# Patient Record
Sex: Female | Born: 1950
Health system: Southern US, Community
[De-identification: ages and names within clinical notes are randomized; demographics above are authoritative.]

## PROBLEM LIST (undated history)

## (undated) DIAGNOSIS — I10 Essential (primary) hypertension: Secondary | ICD-10-CM

## (undated) DIAGNOSIS — E785 Hyperlipidemia, unspecified: Secondary | ICD-10-CM

## (undated) DIAGNOSIS — I739 Peripheral vascular disease, unspecified: Secondary | ICD-10-CM

## (undated) DIAGNOSIS — I779 Disorder of arteries and arterioles, unspecified: Secondary | ICD-10-CM

## (undated) DIAGNOSIS — D509 Iron deficiency anemia, unspecified: Secondary | ICD-10-CM

## (undated) DIAGNOSIS — J309 Allergic rhinitis, unspecified: Secondary | ICD-10-CM

## (undated) DIAGNOSIS — H9190 Unspecified hearing loss, unspecified ear: Secondary | ICD-10-CM

## (undated) DIAGNOSIS — R0789 Other chest pain: Secondary | ICD-10-CM

## (undated) DIAGNOSIS — K635 Polyp of colon: Secondary | ICD-10-CM

## (undated) DIAGNOSIS — A523 Neurosyphilis, unspecified: Secondary | ICD-10-CM

## (undated) DIAGNOSIS — I517 Cardiomegaly: Secondary | ICD-10-CM

## (undated) DIAGNOSIS — D649 Anemia, unspecified: Secondary | ICD-10-CM

## (undated) DIAGNOSIS — I2510 Atherosclerotic heart disease of native coronary artery without angina pectoris: Secondary | ICD-10-CM

## (undated) HISTORY — DX: Atherosclerotic heart disease of native coronary artery without angina pectoris: I25.10

## (undated) HISTORY — DX: Peripheral vascular disease, unspecified: I73.9

## (undated) HISTORY — PX: UPPER GI ENDOSCOPY: SHX6162

## (undated) HISTORY — DX: Other chest pain: R07.89

## (undated) HISTORY — DX: Disorder of arteries and arterioles, unspecified: I77.9

## (undated) HISTORY — DX: Allergic rhinitis, unspecified: J30.9

## (undated) HISTORY — PX: COLONOSCOPY: SHX174

## (undated) HISTORY — DX: Cardiomegaly: I51.7

## (undated) HISTORY — DX: Unspecified hearing loss, unspecified ear: H91.90

## (undated) HISTORY — DX: Anemia, unspecified: D64.9

## (undated) HISTORY — DX: Iron deficiency anemia, unspecified: D50.9

## (undated) HISTORY — DX: Neurosyphilis, unspecified: A52.3

## (undated) HISTORY — DX: Polyp of colon: K63.5

---

## 2005-04-30 ENCOUNTER — Ambulatory Visit: Payer: Self-pay

## 2006-05-14 ENCOUNTER — Ambulatory Visit: Payer: Self-pay

## 2007-06-22 ENCOUNTER — Ambulatory Visit: Payer: Self-pay | Admitting: Internal Medicine

## 2008-06-26 ENCOUNTER — Ambulatory Visit: Payer: Self-pay | Admitting: Internal Medicine

## 2009-05-29 ENCOUNTER — Ambulatory Visit: Payer: Self-pay | Admitting: Internal Medicine

## 2009-06-27 ENCOUNTER — Ambulatory Visit: Payer: Self-pay | Admitting: Internal Medicine

## 2010-07-02 ENCOUNTER — Ambulatory Visit: Payer: Self-pay | Admitting: Internal Medicine

## 2011-08-13 ENCOUNTER — Ambulatory Visit: Payer: Self-pay

## 2012-09-02 ENCOUNTER — Ambulatory Visit: Payer: Self-pay

## 2013-11-14 ENCOUNTER — Ambulatory Visit: Payer: Self-pay

## 2014-05-15 DIAGNOSIS — R2232 Localized swelling, mass and lump, left upper limb: Secondary | ICD-10-CM | POA: Diagnosis not present

## 2014-05-15 DIAGNOSIS — J309 Allergic rhinitis, unspecified: Secondary | ICD-10-CM | POA: Diagnosis not present

## 2014-05-15 DIAGNOSIS — F17210 Nicotine dependence, cigarettes, uncomplicated: Secondary | ICD-10-CM | POA: Diagnosis not present

## 2014-05-15 DIAGNOSIS — I10 Essential (primary) hypertension: Secondary | ICD-10-CM | POA: Diagnosis not present

## 2014-10-16 DIAGNOSIS — I10 Essential (primary) hypertension: Secondary | ICD-10-CM | POA: Diagnosis not present

## 2014-10-16 DIAGNOSIS — E782 Mixed hyperlipidemia: Secondary | ICD-10-CM | POA: Diagnosis not present

## 2014-10-16 DIAGNOSIS — H9190 Unspecified hearing loss, unspecified ear: Secondary | ICD-10-CM | POA: Diagnosis not present

## 2014-10-16 DIAGNOSIS — F17210 Nicotine dependence, cigarettes, uncomplicated: Secondary | ICD-10-CM | POA: Diagnosis not present

## 2014-10-16 DIAGNOSIS — Z0001 Encounter for general adult medical examination with abnormal findings: Secondary | ICD-10-CM | POA: Diagnosis not present

## 2014-10-30 DIAGNOSIS — Z0001 Encounter for general adult medical examination with abnormal findings: Secondary | ICD-10-CM | POA: Diagnosis not present

## 2014-10-30 DIAGNOSIS — I10 Essential (primary) hypertension: Secondary | ICD-10-CM | POA: Diagnosis not present

## 2014-10-30 DIAGNOSIS — E559 Vitamin D deficiency, unspecified: Secondary | ICD-10-CM | POA: Diagnosis not present

## 2014-10-30 DIAGNOSIS — E782 Mixed hyperlipidemia: Secondary | ICD-10-CM | POA: Diagnosis not present

## 2015-01-08 DIAGNOSIS — Z13820 Encounter for screening for osteoporosis: Secondary | ICD-10-CM | POA: Diagnosis not present

## 2015-01-08 DIAGNOSIS — M85852 Other specified disorders of bone density and structure, left thigh: Secondary | ICD-10-CM | POA: Diagnosis not present

## 2015-01-08 DIAGNOSIS — M85862 Other specified disorders of bone density and structure, left lower leg: Secondary | ICD-10-CM | POA: Diagnosis not present

## 2015-01-08 DIAGNOSIS — N958 Other specified menopausal and perimenopausal disorders: Secondary | ICD-10-CM | POA: Diagnosis not present

## 2015-01-08 DIAGNOSIS — Z1231 Encounter for screening mammogram for malignant neoplasm of breast: Secondary | ICD-10-CM | POA: Diagnosis not present

## 2015-09-05 DIAGNOSIS — H40003 Preglaucoma, unspecified, bilateral: Secondary | ICD-10-CM | POA: Diagnosis not present

## 2015-09-24 DIAGNOSIS — I10 Essential (primary) hypertension: Secondary | ICD-10-CM | POA: Diagnosis not present

## 2015-09-24 DIAGNOSIS — M150 Primary generalized (osteo)arthritis: Secondary | ICD-10-CM | POA: Diagnosis not present

## 2015-09-24 DIAGNOSIS — F17210 Nicotine dependence, cigarettes, uncomplicated: Secondary | ICD-10-CM | POA: Diagnosis not present

## 2015-09-24 DIAGNOSIS — E782 Mixed hyperlipidemia: Secondary | ICD-10-CM | POA: Diagnosis not present

## 2015-11-07 DIAGNOSIS — H40003 Preglaucoma, unspecified, bilateral: Secondary | ICD-10-CM | POA: Diagnosis not present

## 2015-11-09 DIAGNOSIS — Z0001 Encounter for general adult medical examination with abnormal findings: Secondary | ICD-10-CM | POA: Diagnosis not present

## 2015-11-09 DIAGNOSIS — I10 Essential (primary) hypertension: Secondary | ICD-10-CM | POA: Diagnosis not present

## 2015-11-09 DIAGNOSIS — E559 Vitamin D deficiency, unspecified: Secondary | ICD-10-CM | POA: Diagnosis not present

## 2015-11-09 DIAGNOSIS — E782 Mixed hyperlipidemia: Secondary | ICD-10-CM | POA: Diagnosis not present

## 2015-11-14 DIAGNOSIS — H40003 Preglaucoma, unspecified, bilateral: Secondary | ICD-10-CM | POA: Diagnosis not present

## 2015-11-15 DIAGNOSIS — G5601 Carpal tunnel syndrome, right upper limb: Secondary | ICD-10-CM | POA: Diagnosis not present

## 2015-11-15 DIAGNOSIS — F17210 Nicotine dependence, cigarettes, uncomplicated: Secondary | ICD-10-CM | POA: Diagnosis not present

## 2015-11-15 DIAGNOSIS — Z0001 Encounter for general adult medical examination with abnormal findings: Secondary | ICD-10-CM | POA: Diagnosis not present

## 2015-11-15 DIAGNOSIS — I10 Essential (primary) hypertension: Secondary | ICD-10-CM | POA: Diagnosis not present

## 2015-11-15 DIAGNOSIS — E782 Mixed hyperlipidemia: Secondary | ICD-10-CM | POA: Diagnosis not present

## 2016-05-28 ENCOUNTER — Other Ambulatory Visit: Payer: Self-pay | Admitting: Nurse Practitioner

## 2016-05-28 DIAGNOSIS — E782 Mixed hyperlipidemia: Secondary | ICD-10-CM | POA: Diagnosis not present

## 2016-05-28 DIAGNOSIS — I10 Essential (primary) hypertension: Secondary | ICD-10-CM | POA: Diagnosis not present

## 2016-05-28 DIAGNOSIS — G5603 Carpal tunnel syndrome, bilateral upper limbs: Secondary | ICD-10-CM | POA: Diagnosis not present

## 2016-05-28 DIAGNOSIS — Z1231 Encounter for screening mammogram for malignant neoplasm of breast: Secondary | ICD-10-CM

## 2016-07-01 ENCOUNTER — Ambulatory Visit
Admission: RE | Admit: 2016-07-01 | Discharge: 2016-07-01 | Disposition: A | Discharge status: Home / Self Care | Payer: Commercial Managed Care - HMO | Source: Ambulatory Visit | Attending: Nurse Practitioner | Admitting: Nurse Practitioner

## 2016-07-01 DIAGNOSIS — Z1231 Encounter for screening mammogram for malignant neoplasm of breast: Secondary | ICD-10-CM | POA: Diagnosis not present

## 2016-07-10 ENCOUNTER — Inpatient Hospital Stay
Admission: RE | Admit: 2016-07-10 | Discharge: 2016-07-10 | Disposition: A | Discharge status: Home / Self Care | Payer: Self-pay | Source: Ambulatory Visit | Attending: *Deleted | Admitting: *Deleted

## 2016-07-10 ENCOUNTER — Other Ambulatory Visit: Payer: Self-pay | Admitting: *Deleted

## 2016-07-10 DIAGNOSIS — Z9289 Personal history of other medical treatment: Secondary | ICD-10-CM

## 2016-12-02 DIAGNOSIS — N390 Urinary tract infection, site not specified: Secondary | ICD-10-CM | POA: Diagnosis not present

## 2016-12-02 DIAGNOSIS — Z0001 Encounter for general adult medical examination with abnormal findings: Secondary | ICD-10-CM | POA: Diagnosis not present

## 2016-12-02 DIAGNOSIS — I951 Orthostatic hypotension: Secondary | ICD-10-CM | POA: Diagnosis not present

## 2016-12-02 DIAGNOSIS — F17210 Nicotine dependence, cigarettes, uncomplicated: Secondary | ICD-10-CM | POA: Diagnosis not present

## 2016-12-02 DIAGNOSIS — I10 Essential (primary) hypertension: Secondary | ICD-10-CM | POA: Diagnosis not present

## 2016-12-02 DIAGNOSIS — E782 Mixed hyperlipidemia: Secondary | ICD-10-CM | POA: Diagnosis not present

## 2016-12-08 DIAGNOSIS — E559 Vitamin D deficiency, unspecified: Secondary | ICD-10-CM | POA: Diagnosis not present

## 2016-12-08 DIAGNOSIS — I10 Essential (primary) hypertension: Secondary | ICD-10-CM | POA: Diagnosis not present

## 2016-12-08 DIAGNOSIS — E782 Mixed hyperlipidemia: Secondary | ICD-10-CM | POA: Diagnosis not present

## 2016-12-08 DIAGNOSIS — Z0001 Encounter for general adult medical examination with abnormal findings: Secondary | ICD-10-CM | POA: Diagnosis not present

## 2016-12-29 DIAGNOSIS — E559 Vitamin D deficiency, unspecified: Secondary | ICD-10-CM | POA: Diagnosis not present

## 2016-12-29 DIAGNOSIS — D649 Anemia, unspecified: Secondary | ICD-10-CM | POA: Diagnosis not present

## 2016-12-29 DIAGNOSIS — D509 Iron deficiency anemia, unspecified: Secondary | ICD-10-CM | POA: Diagnosis not present

## 2016-12-29 DIAGNOSIS — E782 Mixed hyperlipidemia: Secondary | ICD-10-CM | POA: Diagnosis not present

## 2016-12-29 DIAGNOSIS — D519 Vitamin B12 deficiency anemia, unspecified: Secondary | ICD-10-CM | POA: Diagnosis not present

## 2016-12-29 DIAGNOSIS — R0989 Other specified symptoms and signs involving the circulatory and respiratory systems: Secondary | ICD-10-CM | POA: Diagnosis not present

## 2016-12-29 DIAGNOSIS — I10 Essential (primary) hypertension: Secondary | ICD-10-CM | POA: Diagnosis not present

## 2016-12-29 DIAGNOSIS — D6481 Anemia due to antineoplastic chemotherapy: Secondary | ICD-10-CM | POA: Diagnosis not present

## 2017-01-12 DIAGNOSIS — R944 Abnormal results of kidney function studies: Secondary | ICD-10-CM | POA: Diagnosis not present

## 2017-01-19 DIAGNOSIS — D519 Vitamin B12 deficiency anemia, unspecified: Secondary | ICD-10-CM | POA: Diagnosis not present

## 2017-01-19 DIAGNOSIS — E782 Mixed hyperlipidemia: Secondary | ICD-10-CM | POA: Diagnosis not present

## 2017-01-19 DIAGNOSIS — N281 Cyst of kidney, acquired: Secondary | ICD-10-CM | POA: Diagnosis not present

## 2017-01-19 DIAGNOSIS — Z23 Encounter for immunization: Secondary | ICD-10-CM | POA: Diagnosis not present

## 2017-01-19 DIAGNOSIS — R944 Abnormal results of kidney function studies: Secondary | ICD-10-CM | POA: Diagnosis not present

## 2017-01-19 DIAGNOSIS — F17210 Nicotine dependence, cigarettes, uncomplicated: Secondary | ICD-10-CM | POA: Diagnosis not present

## 2017-01-19 DIAGNOSIS — I6523 Occlusion and stenosis of bilateral carotid arteries: Secondary | ICD-10-CM | POA: Diagnosis not present

## 2017-01-19 DIAGNOSIS — I10 Essential (primary) hypertension: Secondary | ICD-10-CM | POA: Diagnosis not present

## 2017-01-19 DIAGNOSIS — I951 Orthostatic hypotension: Secondary | ICD-10-CM | POA: Diagnosis not present

## 2017-04-15 DIAGNOSIS — R944 Abnormal results of kidney function studies: Secondary | ICD-10-CM | POA: Diagnosis not present

## 2017-04-15 DIAGNOSIS — D509 Iron deficiency anemia, unspecified: Secondary | ICD-10-CM | POA: Diagnosis not present

## 2017-04-17 ENCOUNTER — Ambulatory Visit: Payer: Self-pay | Admitting: Nurse Practitioner

## 2017-05-22 ENCOUNTER — Other Ambulatory Visit: Payer: Self-pay

## 2017-05-22 MED ORDER — LISINOPRIL-HYDROCHLOROTHIAZIDE 20-12.5 MG PO TABS: 1.0000 | ORAL_TABLET | Freq: Every day | ORAL | 1 refills | Status: DC

## 2017-05-28 ENCOUNTER — Observation Stay
Admission: EM | Admit: 2017-05-28 | Discharge: 2017-05-30 | Disposition: A | Discharge status: Home / Self Care | Payer: Medicare HMO | Attending: Internal Medicine | Admitting: Internal Medicine

## 2017-05-28 ENCOUNTER — Emergency Department: Payer: Medicare HMO

## 2017-05-28 ENCOUNTER — Encounter: Payer: Self-pay | Admitting: Emergency Medicine

## 2017-05-28 ENCOUNTER — Telehealth: Payer: Self-pay

## 2017-05-28 DIAGNOSIS — G9341 Metabolic encephalopathy: Secondary | ICD-10-CM | POA: Diagnosis not present

## 2017-05-28 DIAGNOSIS — I10 Essential (primary) hypertension: Secondary | ICD-10-CM | POA: Diagnosis not present

## 2017-05-28 DIAGNOSIS — N179 Acute kidney failure, unspecified: Secondary | ICD-10-CM | POA: Diagnosis not present

## 2017-05-28 DIAGNOSIS — E785 Hyperlipidemia, unspecified: Secondary | ICD-10-CM | POA: Diagnosis not present

## 2017-05-28 DIAGNOSIS — Z23 Encounter for immunization: Secondary | ICD-10-CM | POA: Insufficient documentation

## 2017-05-28 DIAGNOSIS — G9340 Encephalopathy, unspecified: Secondary | ICD-10-CM | POA: Diagnosis not present

## 2017-05-28 DIAGNOSIS — R4182 Altered mental status, unspecified: Secondary | ICD-10-CM

## 2017-05-28 DIAGNOSIS — F17210 Nicotine dependence, cigarettes, uncomplicated: Secondary | ICD-10-CM | POA: Diagnosis not present

## 2017-05-28 DIAGNOSIS — Z79899 Other long term (current) drug therapy: Secondary | ICD-10-CM | POA: Insufficient documentation

## 2017-05-28 DIAGNOSIS — F0390 Unspecified dementia without behavioral disturbance: Secondary | ICD-10-CM | POA: Diagnosis not present

## 2017-05-28 HISTORY — DX: Essential (primary) hypertension: I10

## 2017-05-28 HISTORY — DX: Hyperlipidemia, unspecified: E78.5

## 2017-05-28 LAB — URINALYSIS, COMPLETE (UACMP) WITH MICROSCOPIC
Bilirubin Urine: NEGATIVE
Glucose, UA: NEGATIVE mg/dL
Ketones, ur: NEGATIVE mg/dL
Leukocytes, UA: NEGATIVE
Nitrite: NEGATIVE
Protein, ur: NEGATIVE mg/dL
Specific Gravity, Urine: 1.024 (ref 1.005–1.030)
pH: 5 (ref 5.0–8.0)

## 2017-05-28 LAB — TROPONIN I: Troponin I: <0.03 ng/mL (ref <0.03)

## 2017-05-28 LAB — COMPREHENSIVE METABOLIC PANEL
ALK PHOS: 72 U/L (ref 38–126)
ALT: 13 U/L — ABNORMAL LOW (ref 14–54)
ANION GAP: 16 — AB (ref 5–15)
AST: 16 U/L (ref 15–41)
Albumin: 4.2 g/dL (ref 3.5–5.0)
BILIRUBIN TOTAL: 0.6 mg/dL (ref 0.3–1.2)
BUN: 17 mg/dL (ref 6–20)
CALCIUM: 9.6 mg/dL (ref 8.9–10.3)
CO2: 19 mmol/L — AB (ref 22–32)
Chloride: 107 mmol/L (ref 101–111)
Creatinine, Ser: 1.11 mg/dL — ABNORMAL HIGH (ref 0.44–1.00)
GFR calc non Af Amer: 50 mL/min — ABNORMAL LOW (ref >60)
GFR, EST AFRICAN AMERICAN: 58 mL/min — AB (ref >60)
GLUCOSE: 104 mg/dL — AB (ref 65–99)
Potassium: 3 mmol/L — ABNORMAL LOW (ref 3.5–5.1)
SODIUM: 142 mmol/L (ref 135–145)
TOTAL PROTEIN: 8.2 g/dL — AB (ref 6.5–8.1)

## 2017-05-28 LAB — URINE DRUG SCREEN, QUALITATIVE (ARMC ONLY)
AMPHETAMINES, UR SCREEN: NOT DETECTED
BENZODIAZEPINE, UR SCRN: NOT DETECTED
Barbiturates, Ur Screen: NOT DETECTED
Cannabinoid 50 Ng, Ur ~~LOC~~: NOT DETECTED
Cocaine Metabolite,Ur ~~LOC~~: NOT DETECTED
MDMA (ECSTASY) UR SCREEN: NOT DETECTED
METHADONE SCREEN, URINE: NOT DETECTED
OPIATE, UR SCREEN: NOT DETECTED
Phencyclidine (PCP) Ur S: NOT DETECTED
Tricyclic, Ur Screen: NOT DETECTED

## 2017-05-28 LAB — CBC
HCT: 37.3 % (ref 35.0–47.0)
Hemoglobin: 12.7 g/dL (ref 12.0–16.0)
MCH: 29.7 pg (ref 26.0–34.0)
MCHC: 34 g/dL (ref 32.0–36.0)
MCV: 87.5 fL (ref 80.0–100.0)
Platelets: 327 10*3/uL (ref 150–440)
RBC: 4.26 MIL/uL (ref 3.80–5.20)
RDW: 15.9 % — ABNORMAL HIGH (ref 11.5–14.5)
WBC: 6.2 10*3/uL (ref 3.6–11.0)

## 2017-05-28 LAB — ACETAMINOPHEN LEVEL: Acetaminophen (Tylenol), Serum: <10 ug/mL — ABNORMAL LOW (ref 10–30)

## 2017-05-28 LAB — ETHANOL: Alcohol, Ethyl (B): <10 mg/dL (ref <10)

## 2017-05-28 LAB — GLUCOSE, CAPILLARY: GLUCOSE-CAPILLARY: 96 mg/dL (ref 65–99)

## 2017-05-28 LAB — SALICYLATE LEVEL: Salicylate Lvl: <7 mg/dL (ref 2.8–30.0)

## 2017-05-28 LAB — AMMONIA: AMMONIA: 30 umol/L (ref 9–35)

## 2017-05-28 MED ORDER — LABETALOL HCL 5 MG/ML IV SOLN
10.0000 mg | Freq: Once | INTRAVENOUS | Status: AC
  Administered 2017-05-28: 10 mg via INTRAVENOUS
  Filled 2017-05-28: qty 4

## 2017-05-28 NOTE — ED Provider Notes (Signed)
[facility] Emergency Department Provider Note  ___________________________________________   First MD Initiated Contact with Patient 05/28/17 2041     (approximate)  I have reviewed the triage vital signs and the nursing notes.   HISTORY  Chief Complaint Leg Pain and Altered Mental Status   HPI Natalie Knox is a 67 y.o. female with a history of hyperlipidemia as well as hypertension who is presenting with 2-3 weeks of worsening mental status.  She is accompanied by her daughter who states that the patient is becoming more confused and last night awakened at 3 AM and has been awake ever since.  Daughter says that the patient then broke into tears at work today and the patient's daughter received a phone call from the patient's supervisor concerned about the patient's behavior.  Patient also complaining of pain to the anterior tibia just distal to the knee where she said she hit the leg against a drawer several days ago.  The patient has been ambulatory.  Daughter also states that the patient has been more forgetful over the past several weeks.  Patient drinks alcohol but denies doing this daily and denies increase in the amount of alcohol.  No psychiatric history.  Patient has been out of her blood pressure medications over the past week.  Daughter states that the patient becomes upset and denies being forgetful and acting strangely over the past several weeks.  When asked, the patient denies any event happening at work today.  Past Medical History:  Diagnosis Date  . Hyperlipidemia   . Hypertension     There are no active problems to display for this patient.   History reviewed. No pertinent surgical history.  Prior to Admission medications   Medication Sig Start Date End Date Taking? Authorizing Provider  lisinopril-hydrochlorothiazide (PRINZIDE,ZESTORETIC) 20-12.5 MG tablet Take 1 tablet by mouth daily. 05/22/17   Ronnell Freshwater, NP     Allergies Patient has no known allergies.  No family history on file.  Social History Social History   Tobacco Use  . Smoking status: Current Every Day Smoker    Packs/day: 0.50    Types: Cigarettes  . Smokeless tobacco: Never Used  Substance Use Topics  . Alcohol use: Yes    Frequency: Never    Comment: two days a week  . Drug use: No    Review of Systems  Constitutional: No fever/chills Eyes: No visual changes. ENT: No sore throat. Cardiovascular: Denies chest pain. Respiratory: Denies shortness of breath. Gastrointestinal: No abdominal pain.  No nausea, no vomiting.  No diarrhea.  No constipation. Genitourinary: Negative for dysuria. Musculoskeletal: Negative for back pain. Skin: Negative for rash. Neurological: Negative for headaches, focal weakness or numbness.   ____________________________________________   PHYSICAL EXAM:  VITAL SIGNS: ED Triage Vitals  Enc Vitals Group     BP 05/28/17 1936 (!) 186/63     Pulse Rate 05/28/17 1936 83     Resp 05/28/17 1936 17     Temp 05/28/17 1936 98.8 F (37.1 C)     Temp Source 05/28/17 1936 Oral     SpO2 05/28/17 1936 100 %     Weight 05/28/17 1938 198 lb (89.8 kg)     Height 05/28/17 1938 5\' 5"  (1.651 m)     Head Circumference --      Peak Flow --      Pain Score 05/28/17 1947 6     Pain Loc --      Pain Edu? --  Excl. in [place]? --     Constitutional: Alert and oriented x3. Well appearing and in no acute distress. Eyes: Conjunctivae are normal.  Head: Atraumatic. Nose: No congestion/rhinnorhea. Mouth/Throat: Mucous membranes are moist.  Neck: No stridor.   Cardiovascular: Normal rate, regular rhythm. Grossly normal heart sounds.  Respiratory: Normal respiratory effort.  No retractions. Lungs CTAB. Gastrointestinal: Soft and nontender. No distention. No CVA tenderness. Musculoskeletal: No lower extremity edema.  No tenderness over the anterior and proximal tibia.  Patient able to ambulate.  5 out of  5 strength to bilateral lower extreme knees.  Bilateral equal dorsalis pedis pulses.  No joint effusions. Neurologic:  Normal speech and language. No gross focal neurologic deficits are appreciated. Skin:  Skin is warm, dry and intact. No rash noted. Psychiatric: Mood and affect are normal. Speech and behavior are normal.  ____________________________________________   LABS (all labs ordered are listed, but only abnormal results are displayed)  Labs Reviewed  COMPREHENSIVE METABOLIC PANEL - Abnormal; Notable for the following components:      Result Value   Potassium 3.0 (*)    CO2 19 (*)    Glucose, Bld 104 (*)    Creatinine, Ser 1.11 (*)    Total Protein 8.2 (*)    ALT 13 (*)    GFR calc non Af Amer 50 (*)    GFR calc Af Amer 58 (*)    Anion gap 16 (*)    All other components within normal limits  CBC - Abnormal; Notable for the following components:   RDW 15.9 (*)    All other components within normal limits  URINALYSIS, COMPLETE (UACMP) WITH MICROSCOPIC - Abnormal; Notable for the following components:   Color, Urine YELLOW (*)    APPearance HAZY (*)    Hgb urine dipstick SMALL (*)    Bacteria, UA RARE (*)    Squamous Epithelial / LPF 0-5 (*)    All other components within normal limits  ACETAMINOPHEN LEVEL - Abnormal; Notable for the following components:   Acetaminophen (Tylenol), Serum <10 (*)    All other components within normal limits  URINE DRUG SCREEN, QUALITATIVE ([facility] ONLY)  ETHANOL  SALICYLATE LEVEL  AMMONIA  GLUCOSE, CAPILLARY  TROPONIN I  CBG MONITORING, ED   ____________________________________________  EKG  ED ECG REPORT I, Doran Stabler, the attending physician, personally viewed and interpreted this ECG.   Date: 05/28/2017  EKG Time: 2003  Rate: 78  Rhythm: normal sinus rhythm  Axis: Left  Intervals:none  ST&T Change: No ST segment elevation or depression.  T wave inversions in 1,  aVL.  ____________________________________________  RADIOLOGY  Streaky atelectasis or scarring at the left base of the chest x-ray.  No acute intracranial abnormality. ____________________________________________   PROCEDURES  Procedure(s) performed:   Procedures  Critical Care performed:   ____________________________________________   INITIAL IMPRESSION / ASSESSMENT AND PLAN / ED COURSE  Pertinent labs & imaging results that were available during my care of the patient were reviewed by me and considered in my medical decision making (see chart for details).  Differential diagnosis includes, but is not limited to, alcohol, illicit or prescription medications, or other toxic ingestion; intracranial pathology such as stroke or intracerebral hemorrhage; fever or infectious causes including sepsis; hypoxemia and/or hypercarbia; uremia; trauma; endocrine related disorders such as diabetes, hypoglycemia, and thyroid-related diseases; hypertensive encephalopathy; etc. As part of my medical decision making, I reviewed the following data within the electronic MEDICAL RECORD NUMBER Notes from prior ED visits  -----------------------------------------  11:18 PM on 05/28/2017 -----------------------------------------  Discussed the possibility of hypertensive encephalopathy with Dr. Jannifer Franklin.  He says that he would like to see the MRI first before admitting the patient.  Signed out to Dr. Beather Arbour.  Hospitalist aware.  Also discussed the case with the daughter and that the patient may need to be admitted to the hospital.  Patient without any complaints or distress at this time. ____________________________________________   FINAL CLINICAL IMPRESSION(S) / ED DIAGNOSES  Altered mental status.  Hypertension.      NEW MEDICATIONS STARTED DURING THIS VISIT:  New Prescriptions   No medications on file     Note:  This document was prepared using Dragon voice recognition software and may include  unintentional dictation errors.     Orbie Pyo, MD 05/28/17 [phone]

## 2017-05-28 NOTE — ED Notes (Addendum)
Pt daughter states she has right leg complaints and that her mental state is altered. Shes been talking about "weird" things and waking out of sleep crying. Started about 3 weeks ago. Pt right leg shakes and is weaker than normal according to daughter. Pt is alert to name

## 2017-05-28 NOTE — Telephone Encounter (Signed)
-----   Message from Lavera Guise, MD sent at 05/28/2017 12:48 PM EST ----- PLEASE SEND HER TO ED. I can see her once I am back in the office last week of March  ----- Message ----- From: Laurie Panda Sent: 05/28/2017  10:53 AM To: Lavera Guise, MD  Patient daughter called patient is having vivid dreams with waking up to thinking they are real. Behavior changing drastically and they are concerned and had to leave work today due to behavior changing. The daughter is requesting appt stat today or a call back  [phone]

## 2017-05-28 NOTE — Telephone Encounter (Signed)
Spoke with patient daughter and with patient symptoms new onset of dreams and believing are real and change in behavior that she needs to take patient to ER per Dr Humphrey Rolls and daughter did not agree and doesn't think her mom will be willing to go to the ER/ br

## 2017-05-28 NOTE — ED Triage Notes (Signed)
Pt comes into the ED via POV with daughter c/o right leg pain and altered mental status.  Patient states she has had excess shaking and pain in the right leg and her daughter states that "she has been talking out of her head".  Patient's daughter states that she has been getting up in the middle of the night multiple times and breaking down crying more often.  Patient in NAD at this time with even and unlabored respirations.  Patient has also been out of her BP medication for 1 week.

## 2017-05-29 ENCOUNTER — Other Ambulatory Visit: Payer: Self-pay

## 2017-05-29 ENCOUNTER — Encounter: Payer: Self-pay | Admitting: *Deleted

## 2017-05-29 ENCOUNTER — Observation Stay: Payer: Medicare HMO

## 2017-05-29 DIAGNOSIS — R4182 Altered mental status, unspecified: Secondary | ICD-10-CM | POA: Diagnosis not present

## 2017-05-29 DIAGNOSIS — G9341 Metabolic encephalopathy: Secondary | ICD-10-CM | POA: Diagnosis present

## 2017-05-29 DIAGNOSIS — N179 Acute kidney failure, unspecified: Secondary | ICD-10-CM | POA: Diagnosis not present

## 2017-05-29 DIAGNOSIS — I10 Essential (primary) hypertension: Secondary | ICD-10-CM | POA: Diagnosis not present

## 2017-05-29 DIAGNOSIS — E785 Hyperlipidemia, unspecified: Secondary | ICD-10-CM | POA: Diagnosis not present

## 2017-05-29 LAB — AMMONIA: Ammonia: 31 umol/L (ref 9–35)

## 2017-05-29 LAB — TSH: TSH: 1.253 u[IU]/mL (ref 0.350–4.500)

## 2017-05-29 MED ORDER — ONDANSETRON HCL 4 MG PO TABS: 4.0000 mg | ORAL_TABLET | Freq: Four times a day (QID) | ORAL | Status: DC | PRN

## 2017-05-29 MED ORDER — ACETAMINOPHEN 650 MG RE SUPP: 650.0000 mg | Freq: Four times a day (QID) | RECTAL | Status: DC | PRN

## 2017-05-29 MED ORDER — ONDANSETRON HCL 4 MG/2ML IJ SOLN: 4.0000 mg | Freq: Four times a day (QID) | INTRAMUSCULAR | Status: DC | PRN

## 2017-05-29 MED ORDER — HYDRALAZINE HCL 20 MG/ML IJ SOLN: 10.0000 mg | Freq: Four times a day (QID) | INTRAMUSCULAR | Status: DC | PRN

## 2017-05-29 MED ORDER — DOCUSATE SODIUM 100 MG PO CAPS
100.0000 mg | ORAL_CAPSULE | Freq: Two times a day (BID) | ORAL | Status: DC
  Administered 2017-05-29 – 2017-05-30 (×3): 100 mg via ORAL
  Filled 2017-05-29 (×3): qty 1

## 2017-05-29 MED ORDER — POTASSIUM CHLORIDE CRYS ER 20 MEQ PO TBCR
40.0000 meq | EXTENDED_RELEASE_TABLET | ORAL | Status: AC
Start: 2017-05-29 — End: 2017-05-29
  Administered 2017-05-29: 40 meq via ORAL
  Filled 2017-05-29: qty 2

## 2017-05-29 MED ORDER — ACETAMINOPHEN 325 MG PO TABS: 650.0000 mg | ORAL_TABLET | Freq: Four times a day (QID) | ORAL | Status: DC | PRN

## 2017-05-29 MED ORDER — PRAVASTATIN SODIUM 20 MG PO TABS
40.0000 mg | ORAL_TABLET | Freq: Every day | ORAL | Status: DC
  Administered 2017-05-29: 40 mg via ORAL
  Filled 2017-05-29: qty 2

## 2017-05-29 MED ORDER — SODIUM CHLORIDE 0.9 % IV SOLN
INTRAVENOUS | Status: DC
  Administered 2017-05-29 – 2017-05-30 (×4): via INTRAVENOUS

## 2017-05-29 MED ORDER — HEPARIN SODIUM (PORCINE) 5000 UNIT/ML IJ SOLN
5000.0000 [IU] | Freq: Three times a day (TID) | INTRAMUSCULAR | Status: DC
  Administered 2017-05-29 – 2017-05-30 (×4): 5000 [IU] via SUBCUTANEOUS
  Filled 2017-05-29 (×4): qty 1

## 2017-05-29 MED ORDER — AMLODIPINE BESYLATE 10 MG PO TABS
10.0000 mg | ORAL_TABLET | Freq: Every day | ORAL | Status: DC
  Administered 2017-05-29 – 2017-05-30 (×2): 10 mg via ORAL
  Filled 2017-05-29 (×2): qty 1

## 2017-05-29 MED ORDER — PNEUMOCOCCAL VAC POLYVALENT 25 MCG/0.5ML IJ INJ
0.5000 mL | INJECTION | INTRAMUSCULAR | Status: AC
  Administered 2017-05-30: 10:00:00 0.5 mL via INTRAMUSCULAR
  Filled 2017-05-29: qty 0.5

## 2017-05-29 NOTE — Plan of Care (Signed)
  Progressing Education: Knowledge of General Education information will improve 05/29/2017 1800 - Progressing by Daylene Posey, RN Health Behavior/Discharge Planning: Ability to manage health-related needs will improve 05/29/2017 1800 - Progressing by Daylene Posey, RN Activity: Risk for activity intolerance will decrease 05/29/2017 1800 - Progressing by Daylene Posey, RN Safety: Ability to remain free from injury will improve 05/29/2017 1800 - Progressing by Daylene Posey, RN Skin Integrity: Risk for impaired skin integrity will decrease 05/29/2017 1800 - Progressing by Daylene Posey, RN

## 2017-05-29 NOTE — H&P (Signed)
Natalie Knox is an 67 y.o. female.   Chief Complaint: Altered mental status HPI: The patient with past medical history of hypertension and hyperlipidemia presents to the emergency department due to confusion.  [name] daughter states that her mom has become more confused over the last week.  She was in relatively good health until this time.  In the emergency department laboratory evaluation showed metabolic derangements.  The patient displayed no focal neurological deficits but her blood pressure was significantly elevated.  She underwent CT and MRI of her head which did not reveal any acute intracranial abnormality's.  Her blood pressure improved but her mental status did not, thus the emergency department staff requested the hospitalist service to evaluate to further  Past Medical History:  Diagnosis Date  . Hyperlipidemia   . Hypertension     History reviewed. No pertinent surgical history.  The patient can can contribute to her surgical history  No family history on file.  The patient cannot contribute to her own history due to altered mental status Social History:  reports that she has been smoking cigarettes.  She has been smoking about 0.50 packs per day. she has never used smokeless tobacco. She reports that she drinks alcohol. She reports that she does not use drugs.  Allergies: No Known Allergies  Prior to Admission medications   Medication Sig Start Date End Date Taking? Authorizing Provider  amLODipine (NORVASC) 5 MG tablet Take 5 mg by mouth daily.   Yes [provider]  lisinopril-hydrochlorothiazide (PRINZIDE,ZESTORETIC) 20-12.5 MG tablet Take 1 tablet by mouth daily. 05/22/17  Yes Boscia, Greer Ee, NP  pravastatin (PRAVACHOL) 40 MG tablet Take 40 mg by mouth at bedtime.  05/22/17  Yes [provider]     Results for orders placed or performed during the hospital encounter of 05/28/17 (from the past 48 hour(s))  Comprehensive metabolic panel      Status: Abnormal   Collection Time: 05/28/17  7:51 PM  Result Value Ref Range   Sodium 142 135 - 145 mmol/L   Potassium 3.0 (L) 3.5 - 5.1 mmol/L   Chloride 107 101 - 111 mmol/L   CO2 19 (L) 22 - 32 mmol/L   Glucose, Bld 104 (H) 65 - 99 mg/dL   BUN 17 6 - 20 mg/dL   Creatinine, Ser 1.11 (H) 0.44 - 1.00 mg/dL   Calcium 9.6 8.9 - 10.3 mg/dL   Total Protein 8.2 (H) 6.5 - 8.1 g/dL   Albumin 4.2 3.5 - 5.0 g/dL   AST 16 15 - 41 U/L   ALT 13 (L) 14 - 54 U/L   Alkaline Phosphatase 72 38 - 126 U/L   Total Bilirubin 0.6 0.3 - 1.2 mg/dL   GFR calc non Af Amer 50 (L) >60 mL/min   GFR calc Af Amer 58 (L) >60 mL/min    Comment: (NOTE) The eGFR has been calculated using the CKD EPI equation. This calculation has not been validated in all clinical situations. eGFR's persistently <60 mL/min signify possible Chronic Kidney Disease.    Anion gap 16 (H) 5 - 15    Comment: Performed at [facility], [address] [place], [place] [address]  CBC     Status: Abnormal   Collection Time: 05/28/17  7:51 PM  Result Value Ref Range   WBC 6.2 3.6 - 11.0 K/uL   RBC 4.26 3.80 - 5.20 MIL/uL   Hemoglobin 12.7 12.0 - 16.0 g/dL   HCT 37.3 35.0 - 47.0 %  MCV 87.5 80.0 - 100.0 fL   MCH 29.7 26.0 - 34.0 pg   MCHC 34.0 32.0 - 36.0 g/dL   RDW 15.9 (H) 11.5 - 14.5 %   Platelets 327 150 - 440 K/uL    Comment: Performed at [facility], [address] [place], [place] [address]  Urine Drug Screen, Qualitative ([facility] only)     Status: None   Collection Time: 05/28/17  7:51 PM  Result Value Ref Range   Tricyclic, Ur Screen NONE DETECTED NONE DETECTED   Amphetamines, Ur Screen NONE DETECTED NONE DETECTED   MDMA (Ecstasy)Ur Screen NONE DETECTED NONE DETECTED   Cocaine Metabolite,Ur [place] NONE DETECTED NONE DETECTED   Opiate, Ur Screen NONE DETECTED NONE DETECTED   Phencyclidine (PCP) Ur S NONE DETECTED NONE DETECTED   Cannabinoid 50 Ng, Ur [place] NONE DETECTED NONE DETECTED   Barbiturates, Ur  Screen NONE DETECTED NONE DETECTED   Benzodiazepine, Ur Scrn NONE DETECTED NONE DETECTED   Methadone Scn, Ur NONE DETECTED NONE DETECTED    Comment: (NOTE) Tricyclics + metabolites, urine    Cutoff 1000 ng/mL Amphetamines + metabolites, urine  Cutoff 1000 ng/mL MDMA (Ecstasy), urine              Cutoff 500 ng/mL Cocaine Metabolite, urine          Cutoff 300 ng/mL Opiate + metabolites, urine        Cutoff 300 ng/mL Phencyclidine (PCP), urine         Cutoff 25 ng/mL Cannabinoid, urine                 Cutoff 50 ng/mL Barbiturates + metabolites, urine  Cutoff 200 ng/mL Benzodiazepine, urine              Cutoff 200 ng/mL Methadone, urine                   Cutoff 300 ng/mL The urine drug screen provides only a preliminary, unconfirmed analytical test result and should not be used for non-medical purposes. Clinical consideration and professional judgment should be applied to any positive drug screen result due to possible interfering substances. A more specific alternate chemical method must be used in order to obtain a confirmed analytical result. Gas chromatography / mass spectrometry (GC/MS) is the preferred confirmat ory method. Performed at [facility], [address] [place], [place] [address]   Troponin I     Status: None   Collection Time: 05/28/17  7:51 PM  Result Value Ref Range   Troponin I <0.03 <0.03 ng/mL    Comment: Performed at [facility], [address] [place], [place] [address]  Urinalysis, Complete w Microscopic     Status: Abnormal   Collection Time: 05/28/17  9:11 PM  Result Value Ref Range   Color, Urine YELLOW (A) YELLOW   APPearance HAZY (A) CLEAR   Specific Gravity, Urine 1.024 1.005 - 1.030   pH 5.0 5.0 - 8.0   Glucose, UA NEGATIVE NEGATIVE mg/dL   Hgb urine dipstick SMALL (A) NEGATIVE   Bilirubin Urine NEGATIVE NEGATIVE   Ketones, ur NEGATIVE NEGATIVE mg/dL   Protein, ur NEGATIVE NEGATIVE mg/dL   Nitrite NEGATIVE NEGATIVE    Leukocytes, UA NEGATIVE NEGATIVE   RBC / HPF 0-5 0 - 5 RBC/hpf   WBC, UA 0-5 0 - 5 WBC/hpf   Bacteria, UA RARE (A) NONE SEEN   Squamous Epithelial / LPF 0-5 (A) NONE SEEN   Mucus PRESENT  Hyaline Casts, UA PRESENT     Comment: Performed at [facility], [address] [place], [place] [address]  Glucose, capillary     Status: None   Collection Time: 05/28/17  9:42 PM  Result Value Ref Range   Glucose-Capillary 96 65 - 99 mg/dL  Ethanol     Status: None   Collection Time: 05/28/17  9:48 PM  Result Value Ref Range   Alcohol, Ethyl (B) <10 <10 mg/dL    Comment:        LOWEST DETECTABLE LIMIT FOR SERUM ALCOHOL IS 10 mg/dL FOR MEDICAL PURPOSES ONLY Performed at [facility], [address] [place], [place] [address]   Acetaminophen level     Status: Abnormal   Collection Time: 05/28/17  9:48 PM  Result Value Ref Range   Acetaminophen (Tylenol), Serum <10 (L) 10 - 30 ug/mL    Comment:        THERAPEUTIC CONCENTRATIONS VARY SIGNIFICANTLY. A RANGE OF 10-30 ug/mL MAY BE AN EFFECTIVE CONCENTRATION FOR MANY PATIENTS. HOWEVER, SOME ARE BEST TREATED AT CONCENTRATIONS OUTSIDE THIS RANGE. ACETAMINOPHEN CONCENTRATIONS >150 ug/mL AT 4 HOURS AFTER INGESTION AND >50 ug/mL AT 12 HOURS AFTER INGESTION ARE OFTEN ASSOCIATED WITH TOXIC REACTIONS. Performed at [facility], [address] [place], [place] [address]   Salicylate level     Status: None   Collection Time: 05/28/17  9:48 PM  Result Value Ref Range   Salicylate Lvl <6.2 2.8 - 30.0 mg/dL    Comment: Performed at [facility], [address] [place], [place] [address]  Ammonia     Status: None   Collection Time: 05/28/17  9:48 PM  Result Value Ref Range   Ammonia 30 9 - 35 umol/L    Comment: Performed at [facility], [address] [place], [place] [address]   Dg Chest 2 View  Result Date: 05/28/2017 CLINICAL DATA:  Altered mental status EXAM: CHEST - 2 VIEW COMPARISON:   Report 04/19/2003 FINDINGS: Streaky atelectasis or scarring at the left base. No focal consolidation or pleural effusion. Mild cardiomegaly with aortic atherosclerosis. No pneumothorax. IMPRESSION: 1. Streaky atelectasis or scarring at the left base 2. Mild cardiomegaly Electronically Signed   By: Donavan Foil M.D.   On: 05/28/2017 21:46   Ct Head Wo Contrast  Result Date: 05/28/2017 CLINICAL DATA:  Altered mental status and right leg complaints starting 3 weeks ago. EXAM: CT HEAD WITHOUT CONTRAST TECHNIQUE: Contiguous axial images were obtained from the base of the skull through the vertex without intravenous contrast. COMPARISON:  None. FINDINGS: Brain: Minimal chronic appearing small vessel ischemic disease of periventricular white matter. Mild age related involutional changes of the brain without acute intracranial hemorrhage, mass, midline shift or edema. No extra-axial fluid collections. Vascular: No hyperdense vessel sign. Moderate atherosclerosis of the carotid siphons bilaterally. Skull: Negative for fracture or focal lesions. Sinuses/Orbits: No acute finding. Other: None IMPRESSION: Mild age related involutional changes of the brain with chronic appearing small vessel ischemic disease. No acute intracranial abnormality. Electronically Signed   By: Ashley Royalty M.D.   On: 05/28/2017 21:45   Mr Brain Wo Contrast  Result Date: 05/28/2017 CLINICAL DATA:  Initial evaluation for acute altered mental status. EXAM: MRI HEAD WITHOUT CONTRAST TECHNIQUE: Multiplanar, multiecho pulse sequences of the brain and surrounding structures were obtained without intravenous contrast. COMPARISON:  Prior CT from earlier the same day. FINDINGS: Brain: Generalized age-related cerebral atrophy. Few scattered subcentimeter T2/FLAIR hyperintense foci noted within the periventricular  and deep white matter both cerebral hemispheres, nonspecific, but felt to be within normal limits for age. No abnormal foci of restricted  diffusion to suggest acute or subacute ischemia. No encephalomalacia to suggest chronic infarction. No evidence for acute or chronic intracranial hemorrhage. No mass lesion, midline shift or mass effect. No hydrocephalus. No extra-axial fluid collection. Major dural sinuses are grossly patent. Pituitary gland suprasellar region normal. Midline structures intact and normal. Vascular: Major intracranial vascular flow voids are maintained. Skull and upper cervical spine: Craniocervical junction normal. Upper cervical spine within normal limits. Bone marrow signal intensity normal. No scalp soft tissue abnormality. Sinuses/Orbits: Globes orbital soft tissues within normal limits. Paranasal sinuses are clear. No mastoid effusion. Inner ear structures grossly normal. Other: None. IMPRESSION: Normal brain MRI for age.  No acute intracranial process identified. Electronically Signed   By: Jeannine Boga M.D.   On: 05/28/2017 23:53    Review of Systems  Unable to perform ROS: Mental status change    Blood pressure (!) 140/59, pulse 64, temperature 98.8 F (37.1 C), temperature source Oral, resp. rate 17, height _0  (1.651 m), weight 89.8 kg (198 lb), SpO2 95 %. Physical Exam  Vitals reviewed. Constitutional: She appears well-developed and well-nourished. No distress.  HENT:  Head: Normocephalic and atraumatic.  Mouth/Throat: Oropharynx is clear and moist.  Eyes: Conjunctivae and EOM are normal. Pupils are equal, round, and reactive to light.  Neck: Normal range of motion. Neck supple. No JVD present. No tracheal deviation present. No thyromegaly present.  Cardiovascular: Normal rate, regular rhythm and normal heart sounds. Exam reveals no gallop and no friction rub.  No murmur heard. Respiratory: Effort normal and breath sounds normal.  GI: Soft. Bowel sounds are normal. She exhibits no distension. There is no tenderness.  Genitourinary:  Genitourinary Comments: Deferred  Musculoskeletal:  Normal range of motion. She exhibits no edema.  Lymphadenopathy:    She has no cervical adenopathy.  Neurological: She is alert. No cranial nerve deficit. She exhibits normal muscle tone.  Skin: Skin is warm and dry. No rash noted. No erythema.  Psychiatric: She has a normal mood and affect.  Difficult to assess thought and judgment as the patient has a receptive aphasia.  She is confused.     Assessment/Plan This is a 67 year old female admitted for metabolic encephalopathy. 1.  Confusion: Altered mental status appears to be due to metabolic encephalopathy.  Differential diagnosis includes beginning of dementia.  Improve metabolic derangements and consult neurology for further guidance.  The patient may need inpatient psych evaluation as well 2.  Hypertension: Improving; I have held the patient's lisinopril as this may be contributing to her worsened kidney function.  I also increased her dose of amlodipine and ordered hydralazine as needed.  Continue to monitor. 3.  Acute kidney injury: Hydrate with intravenous fluid.  Avoid nephrotoxic agents. 4.  Hyper lipidemia: Continue statin therapy 5.  DVT prophylaxis: Heparin 6.  GI prophylaxis: None The patient is a full code.  Time spent on admission orders and patient care approximately 45 minutes  Harrie Foreman, MD 05/29/2017, 2:22 AM

## 2017-05-29 NOTE — Consult Note (Signed)
Reason for Consult:AMS Referring Physician: Salary  CC: AMS  HPI: Natalie Knox is an 67 y.o. female brought to the ED for confusion by her daughter.  Daughter not available at this time therefore all history obtained from patient and chart.  Patient has had worsening confusion for the past 2-3 weeks.  Has been more forgetful.  Awakened at [facility] on the night prior to presentation and did not go back to sleep prior to presenting the day after.  She broke into tears at work yesterday and the patient's daughter received a phone call from the patient's supervisor concerned about the patient's behavior.  Patient appears to have very little insight into how she has been feeling but does recall crying a lot recently but unclear why.  She reports she has been shaking a lot as well.   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  Reports she has been driving.  Has been out of medications recently.  Lives with daughters.  Past Medical History:  Diagnosis Date  . Hyperlipidemia   . Hypertension     History reviewed. No pertinent surgical history.  Family history: Patient unable to provide  Social History:  reports that she has been smoking cigarettes.  She has been smoking about 0.50 packs per day. she has never used smokeless tobacco. She reports that she drinks alcohol. She reports that she does not use drugs.  No Known Allergies  Medications:  I have reviewed the patient's current medications. Prior to Admission:  Medications Prior to Admission  Medication Sig Dispense Refill Last Dose  . amLODipine (NORVASC) 5 MG tablet Take 5 mg by mouth daily.   05/28/2017 at Unknown time  . lisinopril-hydrochlorothiazide (PRINZIDE,ZESTORETIC) 20-12.5 MG tablet Take 1 tablet by mouth daily. 90 tablet 1 05/28/2017 at Unknown time  . pravastatin (PRAVACHOL) 40 MG tablet Take 40 mg by mouth at bedtime.    05/27/2017 at Unknown time   Scheduled: . amLODipine  10 mg Oral Daily  . docusate sodium  100 mg Oral BID  .  heparin  5,000 Units Subcutaneous Q8H  . [START ON 05/30/2017] pneumococcal 23 valent vaccine  0.5 mL Intramuscular Tomorrow-1000  . pravastatin  40 mg Oral QHS    ROS: History obtained from the patient  General ROS: negative for - chills, fatigue, fever, night sweats, weight gain or weight loss Psychological ROS: as noted in HPI Ophthalmic ROS: negative for - blurry vision, double vision, eye pain or loss of vision ENT ROS: negative for - epistaxis, nasal discharge, oral lesions, sore throat, tinnitus or vertigo Allergy and Immunology ROS: negative for - hives or itchy/watery eyes Hematological and Lymphatic ROS: negative for - bleeding problems, bruising or swollen lymph nodes Endocrine ROS: negative for - galactorrhea, hair pattern changes, polydipsia/polyuria or temperature intolerance Respiratory ROS: negative for - cough, hemoptysis, shortness of breath or wheezing Cardiovascular ROS: negative for - chest pain, dyspnea on exertion, edema or irregular heartbeat Gastrointestinal ROS: negative for - abdominal pain, diarrhea, hematemesis, nausea/vomiting or stool incontinence Genito-Urinary ROS: negative for - dysuria, hematuria, incontinence or urinary frequency/urgency Musculoskeletal ROS: right leg pain secondary to injury Neurological ROS: as noted in HPI Dermatological ROS: negative for rash and skin lesion changes  Physical Examination: Blood pressure (!) 173/64, pulse 68, temperature 98.8 F (37.1 C), temperature source Oral, resp. rate 15, height 5' 5"  (1.651 m), weight 77.8 kg (171 lb 9.6 oz), SpO2 100 %.  HEENT-  Normocephalic, no lesions, without obvious abnormality.  Normal external eye  and conjunctiva.  Normal TM's bilaterally.  Normal auditory canals and external ears. Normal external nose, mucus membranes and septum.  Normal pharynx. Cardiovascular- S1, S2 normal, pulses palpable throughout   Lungs- chest clear, no wheezing, rales, normal symmetric air entry Abdomen-  soft, non-tender; bowel sounds normal; no masses,  no organomegaly Extremities- no edema Lymph-no adenopathy palpable Musculoskeletal-no joint tenderness, deformity or swelling Skin-warm and dry, no hyperpigmentation, vitiligo, or suspicious lesions  Neurological Examination   Mental Status: Alert, oriented to person, place and time.  Reports it is the 10th instead of the 8th.  With more detail does appear to have some confusion.  Speech fluent without evidence of aphasia.  Able to follow 3 step commands without difficulty. Cranial Nerves: II: Discs flat bilaterally; Visual fields grossly normal, pupils equal, round, reactive to light and accommodation III,IV, VI: ptosis not present, extra-ocular motions intact bilaterally V,VII: smile symmetric, facial light touch sensation normal bilaterally VIII: hearing normal bilaterally IX,X: gag reflex present XI: bilateral shoulder shrug XII: midline tongue extension Motor: Right : Upper extremity   5/5    Left:     Upper extremity   5/5 Lifts the LLE at 5/5 strength.  Is shaking with the right leg and places back on bed quickly Tone and bulk:normal tone throughout; no atrophy noted Sensory: Pinprick and light touch intact throughout, bilaterally Deep Tendon Reflexes: 2+ and symmetric with absent AJ's bilaterally Plantars: Right: downgoing   Left: downgoing Cerebellar: Normal finger-to-nose and normal heel-to-shin testing bilaterally Gait: not tested due to safety concerns    Laboratory Studies:   Basic Metabolic Panel: Recent Labs  Lab 05/28/17 1951  NA 142  K 3.0*  CL 107  CO2 19*  GLUCOSE 104*  BUN 17  CREATININE 1.11*  CALCIUM 9.6    Liver Function Tests: Recent Labs  Lab 05/28/17 1951  AST 16  ALT 13*  ALKPHOS 72  BILITOT 0.6  PROT 8.2*  ALBUMIN 4.2   No results for input(s): LIPASE, AMYLASE in the last 168 hours. Recent Labs  Lab 05/28/17 2148 05/29/17 1259  AMMONIA 30 31    CBC: Recent Labs  Lab  05/28/17 1951  WBC 6.2  HGB 12.7  HCT 37.3  MCV 87.5  PLT 327    Cardiac Enzymes: Recent Labs  Lab 05/28/17 1951  TROPONINI <0.03    BNP: Invalid input(s): POCBNP  CBG: Recent Labs  Lab 05/28/17 2142  GLUCAP 96    Microbiology: No results found for this or any previous visit.  Coagulation Studies: No results for input(s): LABPROT, INR in the last 72 hours.  Urinalysis:  Recent Labs  Lab 05/28/17 2111  COLORURINE YELLOW*  LABSPEC 1.024  PHURINE 5.0  GLUCOSEU NEGATIVE  HGBUR SMALL*  BILIRUBINUR NEGATIVE  KETONESUR NEGATIVE  PROTEINUR NEGATIVE  NITRITE NEGATIVE  LEUKOCYTESUR NEGATIVE    Lipid Panel:  No results found for: CHOL, TRIG, HDL, CHOLHDL, VLDL, LDLCALC  HgbA1C: No results found for: HGBA1C  Urine Drug Screen:      Component Value Date/Time   LABOPIA NONE DETECTED 05/28/2017 1951   COCAINSCRNUR NONE DETECTED 05/28/2017 1951   LABBENZ NONE DETECTED 05/28/2017 1951   AMPHETMU NONE DETECTED 05/28/2017 1951   THCU NONE DETECTED 05/28/2017 1951   LABBARB NONE DETECTED 05/28/2017 1951    Alcohol Level:  Recent Labs  Lab 05/28/17 2148  ETH <10    Other results: EKG: sinus rhythm at 78 bpm.  Imaging: Dg Chest 2 View  Result Date: 05/28/2017 CLINICAL DATA:  Altered mental status EXAM: CHEST - 2 VIEW COMPARISON:  Report 04/19/2003 FINDINGS: Streaky atelectasis or scarring at the left base. No focal consolidation or pleural effusion. Mild cardiomegaly with aortic atherosclerosis. No pneumothorax. IMPRESSION: 1. Streaky atelectasis or scarring at the left base 2. Mild cardiomegaly Electronically Signed   By: Donavan Foil M.D.   On: 05/28/2017 21:46   Ct Head Wo Contrast  Result Date: 05/28/2017 CLINICAL DATA:  Altered mental status and right leg complaints starting 3 weeks ago. EXAM: CT HEAD WITHOUT CONTRAST TECHNIQUE: Contiguous axial images were obtained from the base of the skull through the vertex without intravenous contrast. COMPARISON:   None. FINDINGS: Brain: Minimal chronic appearing small vessel ischemic disease of periventricular white matter. Mild age related involutional changes of the brain without acute intracranial hemorrhage, mass, midline shift or edema. No extra-axial fluid collections. Vascular: No hyperdense vessel sign. Moderate atherosclerosis of the carotid siphons bilaterally. Skull: Negative for fracture or focal lesions. Sinuses/Orbits: No acute finding. Other: None IMPRESSION: Mild age related involutional changes of the brain with chronic appearing small vessel ischemic disease. No acute intracranial abnormality. Electronically Signed   By: Ashley Royalty M.D.   On: 05/28/2017 21:45   Mr Brain Wo Contrast  Result Date: 05/28/2017 CLINICAL DATA:  Initial evaluation for acute altered mental status. EXAM: MRI HEAD WITHOUT CONTRAST TECHNIQUE: Multiplanar, multiecho pulse sequences of the brain and surrounding structures were obtained without intravenous contrast. COMPARISON:  Prior CT from earlier the same day. FINDINGS: Brain: Generalized age-related cerebral atrophy. Few scattered subcentimeter T2/FLAIR hyperintense foci noted within the periventricular and deep white matter both cerebral hemispheres, nonspecific, but felt to be within normal limits for age. No abnormal foci of restricted diffusion to suggest acute or subacute ischemia. No encephalomalacia to suggest chronic infarction. No evidence for acute or chronic intracranial hemorrhage. No mass lesion, midline shift or mass effect. No hydrocephalus. No extra-axial fluid collection. Major dural sinuses are grossly patent. Pituitary gland suprasellar region normal. Midline structures intact and normal. Vascular: Major intracranial vascular flow voids are maintained. Skull and upper cervical spine: Craniocervical junction normal. Upper cervical spine within normal limits. Bone marrow signal intensity normal. No scalp soft tissue abnormality. Sinuses/Orbits: Globes orbital  soft tissues within normal limits. Paranasal sinuses are clear. No mastoid effusion. Inner ear structures grossly normal. Other: None. IMPRESSION: Normal brain MRI for age.  No acute intracranial process identified. Electronically Signed   By: Jeannine Boga M.D.   On: 05/28/2017 23:53     Assessment/Plan: 67 year old female presenting with altered mental status that appears to have been progressive over the past 2-3 weeks but it is unclear what her status was prior to then and if she may have had some mild cognitive findings.  It appears that she was at least able to work and drive.  Unclear if there were any concerns at work, etc. Currently on the surface performs rather well but as more detail testing is done she clearly has some confusion.  MRI of the brain reviewed and shows no acute changes.  Lab work with some mild abnormalities but nothing significant.  This may represent a dementia.  Further work up recommended.    Recommendations: 1.  EEG 2.  B12, ESR, RPR, TSH  3.  Unclear how long patient has been on a statin.  May want to consider a trial off as well.    Alexis Goodell, MD Neurology [phone] 05/29/2017, 1:48 PM

## 2017-05-29 NOTE — ED Notes (Signed)
Report has been called. Pt daughter demands to speak with the admitting doctor (Dr. Marcille Blanco) before pt is sent to floor.

## 2017-05-29 NOTE — Care Management Note (Signed)
Case Management Note  Patient Details  Name: Natalie Knox MRN: [id number] Date of Birth: Feb 26, 1951  Subjective/Objective:    Admitted to [facility] with the diagnosis of metabolic encephalopathy. Lives with daughter, [name] [phone]). Scheduled appointment to see, Dr. Junius Creamer this Monday. Prescriptions are filled at [facility] or [facility] order. No home health. No skilled nursing. No Home oxygen. No medical equipment in the home. Takes care of all basic activities of daily living herself, drives. No falls. Good appetite.Family will transport. Very Hard of Hearing.                Action/Plan: Will continue to follow for discharge plans   Expected Discharge Date:                  Expected Discharge Plan:     In-House Referral:   yes  Discharge planning Services   yes  Post Acute Care Choice:    Choice offered to:     DME Arranged:    DME Agency:     HH Arranged:    HH Agency:     Status of Service:     If discussed at [employer] of Stay Meetings, dates discussed:    Additional Comments:  [name], RN MSN CCM Care Management [phone] 05/29/2017, 8:21 AM

## 2017-05-29 NOTE — Progress Notes (Signed)
This is a 67 year old female admitted for metabolic encephalopathy. 1.  Confusion: Altered mental status appears to be due to metabolic encephalopathy.  Differential diagnosis includes beginning of dementia.  Improve metabolic derangements and consult neurology for further guidance.  The patient may need inpatient psych evaluation as well 2.  Hypertension: Improving; I have held the patient's lisinopril as this may be contributing to her worsened kidney function.  I also increased her dose of amlodipine and ordered hydralazine as needed.  Continue to monitor. 3.  Acute kidney injury: Hydrate with intravenous fluid.  Avoid nephrotoxic agents. 4.  Hyper lipidemia: Continue statin therapy 5.  DVT prophylaxis: Heparin 6.  GI prophylaxis: None The patient is a full code.  Time spent on admission orders and patient care approximately 45 minutes   Agree with plan of care per above

## 2017-05-29 NOTE — Care Management Obs Status (Signed)
[facility] NOTIFICATION   Patient Details  Name: Natalie Knox MRN: [id number] Date of Birth: 04/08/1950   Medicare Observation Status Notification Given:  Yes    [name], RN 05/29/2017, 8:15 AM

## 2017-05-29 NOTE — ED Notes (Signed)
Patient transported to 102 

## 2017-05-30 DIAGNOSIS — E785 Hyperlipidemia, unspecified: Secondary | ICD-10-CM | POA: Diagnosis not present

## 2017-05-30 DIAGNOSIS — N179 Acute kidney failure, unspecified: Secondary | ICD-10-CM | POA: Diagnosis not present

## 2017-05-30 DIAGNOSIS — G9341 Metabolic encephalopathy: Secondary | ICD-10-CM | POA: Diagnosis not present

## 2017-05-30 DIAGNOSIS — R4182 Altered mental status, unspecified: Secondary | ICD-10-CM | POA: Diagnosis not present

## 2017-05-30 DIAGNOSIS — I10 Essential (primary) hypertension: Secondary | ICD-10-CM | POA: Diagnosis not present

## 2017-05-30 LAB — BASIC METABOLIC PANEL
Anion gap: 7 (ref 5–15)
BUN: 13 mg/dL (ref 6–20)
CO2: 19 mmol/L — ABNORMAL LOW (ref 22–32)
Calcium: 8.2 mg/dL — ABNORMAL LOW (ref 8.9–10.3)
Chloride: 115 mmol/L — ABNORMAL HIGH (ref 101–111)
Creatinine, Ser: 0.92 mg/dL (ref 0.44–1.00)
GFR calc Af Amer: >60 mL/min (ref >60)
GFR calc non Af Amer: >60 mL/min (ref >60)
Glucose, Bld: 92 mg/dL (ref 65–99)
Potassium: 3.5 mmol/L (ref 3.5–5.1)
Sodium: 141 mmol/L (ref 135–145)

## 2017-05-30 LAB — VITAMIN B12: Vitamin B-12: 264 pg/mL (ref 180–914)

## 2017-05-30 LAB — TSH: TSH: 1.07 u[IU]/mL (ref 0.350–4.500)

## 2017-05-30 LAB — SEDIMENTATION RATE: Sed Rate: 45 mm/hr — ABNORMAL HIGH (ref 0–30)

## 2017-05-30 LAB — HEMOGLOBIN A1C
Hgb A1c MFr Bld: 5.6 % (ref 4.8–5.6)
Mean Plasma Glucose: 114 mg/dL

## 2017-05-30 MED ORDER — VITAMIN B-1 100 MG PO TABS: 100.0000 mg | ORAL_TABLET | Freq: Every day | ORAL | 0 refills | Status: DC

## 2017-05-30 MED ORDER — AMLODIPINE BESYLATE 10 MG PO TABS: 10.0000 mg | ORAL_TABLET | Freq: Every day | ORAL | 0 refills | Status: DC

## 2017-05-30 MED ORDER — MULTI-VITAMIN/MINERALS PO TABS: 1.0000 | ORAL_TABLET | Freq: Every day | ORAL | 2 refills | Status: AC

## 2017-05-30 MED ORDER — DOCUSATE SODIUM 100 MG PO CAPS: 100.0000 mg | ORAL_CAPSULE | Freq: Two times a day (BID) | ORAL | 0 refills | Status: DC | PRN

## 2017-05-30 MED ORDER — FOLIC ACID 1 MG PO TABS: 1.0000 mg | ORAL_TABLET | Freq: Every day | ORAL | 0 refills | Status: DC

## 2017-05-30 MED ORDER — VITAMIN B-1 100 MG PO TABS
100.0000 mg | ORAL_TABLET | Freq: Every day | ORAL | 0 refills | Status: DC
Start: 2017-05-30 — End: 2017-05-30

## 2017-05-30 NOTE — Discharge Summary (Signed)
[facility] [place] NAME: Natalie Knox    MR#:  [id number]  DATE OF BIRTH:  [birth year]  DATE OF ADMISSION:  05/28/2017 ADMITTING PHYSICIAN: Harrie Foreman, MD  DATE OF DISCHARGE:  05/30/17  PRIMARY CARE PHYSICIAN: Ronnell Freshwater, NP    ADMISSION DIAGNOSIS:  Encephalopathy [G93.40] Altered mental status, unspecified altered mental status type [R41.82] Hypertension, unspecified type [I10]  DISCHARGE DIAGNOSIS:  Metabolic encephalopathy resolved Early dementia with intermittent episodes of delirium  alcohol abuse and tobacco abuse   SECONDARY DIAGNOSIS:   Past Medical History:  Diagnosis Date  . Hyperlipidemia   . Hypertension     HOSPITAL COURSE:  HPI: The patient with past medical history of hypertension and hyperlipidemia presents to the emergency department due to confusion.  [name] daughter states that her mom has become more confused over the last week.  She was in relatively good health until this time.  In the emergency department laboratory evaluation showed metabolic derangements.  The patient displayed no focal neurological deficits but her blood pressure was significantly elevated.  She underwent CT and MRI of her head which did not reveal any acute intracranial abnormality's.  Her blood pressure improved but her mental status did not, thus the emergency department staff requested the hospitalist service to evaluate to further    1. Confusion: Altered mental status appears to be due to metabolic encephalopathy from alcohol abuse and withdrawaldfferential diagnosis includes beginning of dementia. Patient is wide awake and alert and at her baseline today, daughter at bedside is agreeing that patient is at her baseline Discussed with neurology who is recommending outpatient neurology follow-up for dementia workup B12 and TSH levels are normal sed rate at 45 Normal EEG.  No acute changes and CT head and MRI  brain Okay to discharge patient from neurology standpoint Outpatient follow-up with alcohol anonymous is recommended as alcohol abuse is playing a major role here with her intermittent episodes of confusion and forgetfulness and early stages of dementia   2. Hypertension: Improving; discontinued  patient's lisinopril as this may be contributing to her worsened kidney function.  Today renal function is back to normal I also increased her dose of amlodipine and ordered hydralazine as needed. Continue to monitor.  Blood pressure is better today  3. Acute kidney injury: Resolved hydrated  with intravenous fluid. Avoid nephrotoxic agents.  4. Hyper lipidemia: Continue statin therapy  5. DVT prophylaxis: Heparin  Alcohol and tobacco abuse disorder Counseled patient to stop smoking for 5 minutes.  Patient is agreeable she might consider taking nicotine patch. Outpatient follow-up with alcohol anonymous  Patient is ambulating without any difficulty as reported by the RN Discharge patient to daughter's care DISCHARGE CONDITIONS:   fair  CONSULTS OBTAINED:  Treatment Team:  Alexis Goodell, MD   PROCEDURES none  DRUG ALLERGIES:  No Known Allergies  DISCHARGE MEDICATIONS:   Allergies as of 05/30/2017   No Known Allergies     Medication List    STOP taking these medications   lisinopril-hydrochlorothiazide 20-12.5 MG tablet Commonly known as:  PRINZIDE,ZESTORETIC     TAKE these medications   amLODipine 10 MG tablet Commonly known as:  NORVASC Take 1 tablet (10 mg total) by mouth daily. What changed:    medication strength  how much to take   docusate sodium 100 MG capsule Commonly known as:  COLACE Take 1 capsule (100 mg total) by mouth 2 (two) times daily as needed for mild  constipation.   folic acid 1 MG tablet Commonly known as:  FOLVITE Take 1 tablet (1 mg total) by mouth daily.   multivitamin with minerals tablet Take 1 tablet by mouth daily.    pravastatin 40 MG tablet Commonly known as:  PRAVACHOL Take 40 mg by mouth at bedtime.   thiamine 100 MG tablet Commonly known as:  VITAMIN B-1 Take 1 tablet (100 mg total) by mouth daily.        DISCHARGE INSTRUCTIONS:   Follow-up with PCP in 5-7 days Follow-up with neurology in 2 weeks Outpatient follow-up with alcohol anonymous in 2-3 days or sooner as needed  DIET:  Low-fat, low-salt  DISCHARGE CONDITION:  Fair  ACTIVITY:  Activity as tolerated  OXYGEN:  Home Oxygen: No.   Oxygen Delivery: room air  DISCHARGE LOCATION:  home   If you experience worsening of your admission symptoms, develop shortness of breath, life threatening emergency, suicidal or homicidal thoughts you must seek medical attention immediately by calling 911 or calling your MD immediately  if symptoms less severe.  You Must read complete instructions/literature along with all the possible adverse reactions/side effects for all the Medicines you take and that have been prescribed to you. Take any new Medicines after you have completely understood and accpet all the possible adverse reactions/side effects.   Please note  You were cared for by a hospitalist during your hospital stay. If you have any questions about your discharge medications or the care you received while you were in the hospital after you are discharged, you can call the unit and asked to speak with the hospitalist on call if the hospitalist that took care of you is not available. Once you are discharged, your primary care physician will handle any further medical issues. Please note that NO REFILLS for any discharge medications will be authorized once you are discharged, as it is imperative that you return to your primary care physician (or establish a relationship with a primary care physician if you do not have one) for your aftercare needs so that they can reassess your need for medications and monitor your lab values.     Today   Chief Complaint  Patient presents with  . Leg Pain  . Altered Mental Status   Patient is doing much better.  Awake and alert and answering questions appropriately.  Daughter at bedside and according to the daughter patient is at her baseline.  Okay to discharge patient from neurology standpoint  ROS:  CONSTITUTIONAL: Denies fevers, chills. Denies any fatigue, weakness.  EYES: Denies blurry vision, double vision, eye pain. EARS, NOSE, THROAT: Denies tinnitus, ear pain, hearing loss. RESPIRATORY: Denies cough, wheeze, shortness of breath.  CARDIOVASCULAR: Denies chest pain, palpitations, edema.  GASTROINTESTINAL: Denies nausea, vomiting, diarrhea, abdominal pain. Denies bright red blood per rectum. GENITOURINARY: Denies dysuria, hematuria. ENDOCRINE: Denies nocturia or thyroid problems. HEMATOLOGIC AND LYMPHATIC: Denies easy bruising or bleeding. SKIN: Denies rash or lesion. MUSCULOSKELETAL: Denies pain in neck, back, shoulder, knees, hips or arthritic symptoms.  NEUROLOGIC: Denies paralysis, paresthesias.  PSYCHIATRIC: Denies anxiety or depressive symptoms.   VITAL SIGNS:  Blood pressure (!) 146/60, pulse 62, temperature 97.7 F (36.5 C), temperature source Oral, resp. rate 16, height 5\' 5"  (1.651 m), weight 82.1 kg (181 lb 1.6 oz), SpO2 98 %.  I/O:    Intake/Output Summary (Last 24 hours) at 05/30/2017 1319 Last data filed at 05/30/2017 1048 Gross per 24 hour  Intake 1488.75 ml  Output -  Net 1488.75 ml  PHYSICAL EXAMINATION:  GENERAL:  67 y.o.-year-old patient lying in the bed with no acute distress.  EYES: Pupils equal, round, reactive to light and accommodation. No scleral icterus. Extraocular muscles intact.  HEENT: Head atraumatic, normocephalic. Oropharynx and nasopharynx clear.  NECK:  Supple, no jugular venous distention. No thyroid enlargement, no tenderness.  LUNGS: Normal breath sounds bilaterally, no wheezing, rales,rhonchi or crepitation. No use of accessory  muscles of respiration.  CARDIOVASCULAR: S1, S2 normal. No murmurs, rubs, or gallops.  ABDOMEN: Soft, non-tender, non-distended. Bowel sounds present. No organomegaly or mass.  EXTREMITIES: No pedal edema, cyanosis, or clubbing.  NEUROLOGIC: Cranial nerves II through XII are intact. Muscle strength 5/5 in all extremities. Sensation intact. Gait not checked.  PSYCHIATRIC: The patient is alert and oriented x 3.  SKIN: No obvious rash, lesion, or ulcer.   DATA REVIEW:   CBC Recent Labs  Lab 05/28/17 1951  WBC 6.2  HGB 12.7  HCT 37.3  PLT 327    Chemistries  Recent Labs  Lab 05/28/17 1951 05/30/17 0542  NA 142 141  K 3.0* 3.5  CL 107 115*  CO2 19* 19*  GLUCOSE 104* 92  BUN 17 13  CREATININE 1.11* 0.92  CALCIUM 9.6 8.2*  AST 16  --   ALT 13*  --   ALKPHOS 72  --   BILITOT 0.6  --     Cardiac Enzymes Recent Labs  Lab 05/28/17 1951  TROPONINI <0.03    Microbiology Results  No results found for this or any previous visit.  RADIOLOGY:  Dg Chest 2 View  Result Date: 05/28/2017 CLINICAL DATA:  Altered mental status EXAM: CHEST - 2 VIEW COMPARISON:  Report 04/19/2003 FINDINGS: Streaky atelectasis or scarring at the left base. No focal consolidation or pleural effusion. Mild cardiomegaly with aortic atherosclerosis. No pneumothorax. IMPRESSION: 1. Streaky atelectasis or scarring at the left base 2. Mild cardiomegaly Electronically Signed   By: Donavan Foil M.D.   On: 05/28/2017 21:46   Ct Head Wo Contrast  Result Date: 05/28/2017 CLINICAL DATA:  Altered mental status and right leg complaints starting 3 weeks ago. EXAM: CT HEAD WITHOUT CONTRAST TECHNIQUE: Contiguous axial images were obtained from the base of the skull through the vertex without intravenous contrast. COMPARISON:  None. FINDINGS: Brain: Minimal chronic appearing small vessel ischemic disease of periventricular white matter. Mild age related involutional changes of the brain without acute intracranial  hemorrhage, mass, midline shift or edema. No extra-axial fluid collections. Vascular: No hyperdense vessel sign. Moderate atherosclerosis of the carotid siphons bilaterally. Skull: Negative for fracture or focal lesions. Sinuses/Orbits: No acute finding. Other: None IMPRESSION: Mild age related involutional changes of the brain with chronic appearing small vessel ischemic disease. No acute intracranial abnormality. Electronically Signed   By: Ashley Royalty M.D.   On: 05/28/2017 21:45   Mr Brain Wo Contrast  Result Date: 05/28/2017 CLINICAL DATA:  Initial evaluation for acute altered mental status. EXAM: MRI HEAD WITHOUT CONTRAST TECHNIQUE: Multiplanar, multiecho pulse sequences of the brain and surrounding structures were obtained without intravenous contrast. COMPARISON:  Prior CT from earlier the same day. FINDINGS: Brain: Generalized age-related cerebral atrophy. Few scattered subcentimeter T2/FLAIR hyperintense foci noted within the periventricular and deep white matter both cerebral hemispheres, nonspecific, but felt to be within normal limits for age. No abnormal foci of restricted diffusion to suggest acute or subacute ischemia. No encephalomalacia to suggest chronic infarction. No evidence for acute or chronic intracranial hemorrhage. No mass lesion, midline shift or mass effect.  No hydrocephalus. No extra-axial fluid collection. Major dural sinuses are grossly patent. Pituitary gland suprasellar region normal. Midline structures intact and normal. Vascular: Major intracranial vascular flow voids are maintained. Skull and upper cervical spine: Craniocervical junction normal. Upper cervical spine within normal limits. Bone marrow signal intensity normal. No scalp soft tissue abnormality. Sinuses/Orbits: Globes orbital soft tissues within normal limits. Paranasal sinuses are clear. No mastoid effusion. Inner ear structures grossly normal. Other: None. IMPRESSION: Normal brain MRI for age.  No acute  intracranial process identified. Electronically Signed   By: Jeannine Boga M.D.   On: 05/28/2017 23:53    EKG:   Orders placed or performed during the hospital encounter of 05/28/17  . EKG 12-Lead  . EKG 12-Lead      Management plans discussed with the patient, family and they are in agreement.  CODE STATUS:     Code Status Orders  (From admission, onward)        Start     Ordered   05/29/17 0344  Full code  Continuous     05/29/17 0343    Code Status History    Date Active Date Inactive Code Status Order ID Comments User Context   This patient has a current code status but no historical code status.      TOTAL TIME TAKING CARE OF THIS PATIENT: 45  minutes.   Note: This dictation was prepared with Dragon dictation along with smaller phrase technology. Any transcriptional errors that result from this process are unintentional.   @[facility] @  on 05/30/2017 at 1:19 PM  Between 7am to 6pm - Pager - [phone]  After 6pm go to [email or web address] - password [email or web address] [facility]  Office  [phone]  CC: Primary care physician; Ronnell Freshwater, NP

## 2017-05-30 NOTE — Progress Notes (Signed)
Discharge instructions along with home medications and follow up gone over with patient and daughter. Both verbalize that they understood instructions. 3 prescriptions given to patient. IV and tele removed. Pt being discharged home on room air, no distress noted. Jeb Schloemer S Fenton

## 2017-05-30 NOTE — Discharge Instructions (Signed)
Follow-up with PCP in 5-7 days Follow-up with neurology in 2 weeks Outpatient follow-up with alcohol anonymous in 2-3 days or sooner as needed

## 2017-05-30 NOTE — Consult Note (Signed)
Reason for Consult:AMS Referring Physician: Salary  CC: AMS  Much more awake and alert today.  She is following commands.  Based on conversation with daughter she is back to her baseline.   Past Medical History:  Diagnosis Date  . Hyperlipidemia   . Hypertension     History reviewed. No pertinent surgical history.  Family history: Patient unable to provide  Social History:  reports that she has been smoking cigarettes.  She has been smoking about 0.50 packs per day. she has never used smokeless tobacco. She reports that she drinks alcohol. She reports that she does not use drugs.  No Known Allergies  Medications:  I have reviewed the patient's current medications. Prior to Admission:  Medications Prior to Admission  Medication Sig Dispense Refill Last Dose  . lisinopril-hydrochlorothiazide (PRINZIDE,ZESTORETIC) 20-12.5 MG tablet Take 1 tablet by mouth daily. 90 tablet 1 05/28/2017 at Unknown time  . pravastatin (PRAVACHOL) 40 MG tablet Take 40 mg by mouth at bedtime.    05/27/2017 at Unknown time   Scheduled: . amLODipine  10 mg Oral Daily  . docusate sodium  100 mg Oral BID  . heparin  5,000 Units Subcutaneous Q8H  . pravastatin  40 mg Oral QHS    ROS: History obtained from the patient  General ROS: negative for - chills, fatigue, fever, night sweats, weight gain or weight loss Psychological ROS: as noted in HPI Ophthalmic ROS: negative for - blurry vision, double vision, eye pain or loss of vision ENT ROS: negative for - epistaxis, nasal discharge, oral lesions, sore throat, tinnitus or vertigo Allergy and Immunology ROS: negative for - hives or itchy/watery eyes Hematological and Lymphatic ROS: negative for - bleeding problems, bruising or swollen lymph nodes Endocrine ROS: negative for - galactorrhea, hair pattern changes, polydipsia/polyuria or temperature intolerance Respiratory ROS: negative for - cough, hemoptysis, shortness of breath or wheezing Cardiovascular ROS:  negative for - chest pain, dyspnea on exertion, edema or irregular heartbeat Gastrointestinal ROS: negative for - abdominal pain, diarrhea, hematemesis, nausea/vomiting or stool incontinence Genito-Urinary ROS: negative for - dysuria, hematuria, incontinence or urinary frequency/urgency Musculoskeletal ROS: right leg pain secondary to injury Neurological ROS: as noted in HPI Dermatological ROS: negative for rash and skin lesion changes  Physical Examination: Blood pressure (!) 146/60, pulse 62, temperature 97.7 F (36.5 C), temperature source Oral, resp. rate 16, height 5\' 5"  (1.651 m), weight 181 lb 1.6 oz (82.1 kg), SpO2 98 %.  HEENT-  Normocephalic, no lesions, without obvious abnormality.  Normal external eye and conjunctiva.  Normal TM's bilaterally.  Normal auditory canals and external ears. Normal external nose, mucus membranes and septum.  Normal pharynx. Cardiovascular- S1, S2 normal, pulses palpable throughout   Lungs- chest clear, no wheezing, rales, normal symmetric air entry Abdomen- soft, non-tender; bowel sounds normal; no masses,  no organomegaly Extremities- no edema Lymph-no adenopathy palpable Musculoskeletal-no joint tenderness, deformity or swelling Skin-warm and dry, no hyperpigmentation, vitiligo, or suspicious lesions  Neurological Examination   Mental Status: Alert, oriented to person, place and time.  Reports it is the 10th instead of the 8th.  With more detail does appear to have some confusion.  Speech fluent without evidence of aphasia.  Able to follow 3 step commands without difficulty. Cranial Nerves: II: Discs flat bilaterally; Visual fields grossly normal, pupils equal, round, reactive to light and accommodation III,IV, VI: ptosis not present, extra-ocular motions intact bilaterally V,VII: smile symmetric, facial light touch sensation normal bilaterally VIII: hearing normal bilaterally IX,X: gag reflex present  XI: bilateral shoulder shrug XII: midline  tongue extension Motor: Right : Upper extremity   5/5    Left:     Upper extremity   5/5 Lifts the LLE at 5/5 strength.  Is shaking with the right leg and places back on bed quickly Tone and bulk:normal tone throughout; no atrophy noted Sensory: Pinprick and light touch intact throughout, bilaterally Deep Tendon Reflexes: 2+ and symmetric with absent AJ's bilaterally Plantars: Right: downgoing   Left: downgoing Cerebellar: Normal finger-to-nose and normal heel-to-shin testing bilaterally Gait: not tested due to safety concerns    Laboratory Studies:   Basic Metabolic Panel: Recent Labs  Lab 05/28/17 1951 05/30/17 0542  NA 142 141  K 3.0* 3.5  CL 107 115*  CO2 19* 19*  GLUCOSE 104* 92  BUN 17 13  CREATININE 1.11* 0.92  CALCIUM 9.6 8.2*    Liver Function Tests: Recent Labs  Lab 05/28/17 1951  AST 16  ALT 13*  ALKPHOS 72  BILITOT 0.6  PROT 8.2*  ALBUMIN 4.2   No results for input(s): LIPASE, AMYLASE in the last 168 hours. Recent Labs  Lab 05/28/17 2148 05/29/17 1259  AMMONIA 30 31    CBC: Recent Labs  Lab 05/28/17 1951  WBC 6.2  HGB 12.7  HCT 37.3  MCV 87.5  PLT 327    Cardiac Enzymes: Recent Labs  Lab 05/28/17 1951  TROPONINI <0.03    BNP: Invalid input(s): POCBNP  CBG: Recent Labs  Lab 05/28/17 2142  GLUCAP 96    Microbiology: No results found for this or any previous visit.  Coagulation Studies: No results for input(s): LABPROT, INR in the last 72 hours.  Urinalysis:  Recent Labs  Lab 05/28/17 2111  COLORURINE YELLOW*  LABSPEC 1.024  PHURINE 5.0  GLUCOSEU NEGATIVE  HGBUR SMALL*  BILIRUBINUR NEGATIVE  KETONESUR NEGATIVE  PROTEINUR NEGATIVE  NITRITE NEGATIVE  LEUKOCYTESUR NEGATIVE    Lipid Panel:  No results found for: CHOL, TRIG, HDL, CHOLHDL, VLDL, LDLCALC  HgbA1C:  Lab Results  Component Value Date   HGBA1C 5.6 05/28/2017    Urine Drug Screen:      Component Value Date/Time   LABOPIA NONE DETECTED  05/28/2017 1951   COCAINSCRNUR NONE DETECTED 05/28/2017 1951   LABBENZ NONE DETECTED 05/28/2017 1951   AMPHETMU NONE DETECTED 05/28/2017 1951   THCU NONE DETECTED 05/28/2017 1951   LABBARB NONE DETECTED 05/28/2017 1951    Alcohol Level:  Recent Labs  Lab 05/28/17 2148  ETH <10    Other results: EKG: sinus rhythm at 78 bpm.  Imaging: Dg Chest 2 View  Result Date: 05/28/2017 CLINICAL DATA:  Altered mental status EXAM: CHEST - 2 VIEW COMPARISON:  Report 04/19/2003 FINDINGS: Streaky atelectasis or scarring at the left base. No focal consolidation or pleural effusion. Mild cardiomegaly with aortic atherosclerosis. No pneumothorax. IMPRESSION: 1. Streaky atelectasis or scarring at the left base 2. Mild cardiomegaly Electronically Signed   By: Donavan Foil M.D.   On: 05/28/2017 21:46   Ct Head Wo Contrast  Result Date: 05/28/2017 CLINICAL DATA:  Altered mental status and right leg complaints starting 3 weeks ago. EXAM: CT HEAD WITHOUT CONTRAST TECHNIQUE: Contiguous axial images were obtained from the base of the skull through the vertex without intravenous contrast. COMPARISON:  None. FINDINGS: Brain: Minimal chronic appearing small vessel ischemic disease of periventricular white matter. Mild age related involutional changes of the brain without acute intracranial hemorrhage, mass, midline shift or edema. No extra-axial fluid collections. Vascular: No hyperdense vessel  sign. Moderate atherosclerosis of the carotid siphons bilaterally. Skull: Negative for fracture or focal lesions. Sinuses/Orbits: No acute finding. Other: None IMPRESSION: Mild age related involutional changes of the brain with chronic appearing small vessel ischemic disease. No acute intracranial abnormality. Electronically Signed   By: Ashley Royalty M.D.   On: 05/28/2017 21:45   Mr Brain Wo Contrast  Result Date: 05/28/2017 CLINICAL DATA:  Initial evaluation for acute altered mental status. EXAM: MRI HEAD WITHOUT CONTRAST  TECHNIQUE: Multiplanar, multiecho pulse sequences of the brain and surrounding structures were obtained without intravenous contrast. COMPARISON:  Prior CT from earlier the same day. FINDINGS: Brain: Generalized age-related cerebral atrophy. Few scattered subcentimeter T2/FLAIR hyperintense foci noted within the periventricular and deep white matter both cerebral hemispheres, nonspecific, but felt to be within normal limits for age. No abnormal foci of restricted diffusion to suggest acute or subacute ischemia. No encephalomalacia to suggest chronic infarction. No evidence for acute or chronic intracranial hemorrhage. No mass lesion, midline shift or mass effect. No hydrocephalus. No extra-axial fluid collection. Major dural sinuses are grossly patent. Pituitary gland suprasellar region normal. Midline structures intact and normal. Vascular: Major intracranial vascular flow voids are maintained. Skull and upper cervical spine: Craniocervical junction normal. Upper cervical spine within normal limits. Bone marrow signal intensity normal. No scalp soft tissue abnormality. Sinuses/Orbits: Globes orbital soft tissues within normal limits. Paranasal sinuses are clear. No mastoid effusion. Inner ear structures grossly normal. Other: None. IMPRESSION: Normal brain MRI for age.  No acute intracranial process identified. Electronically Signed   By: Jeannine Boga M.D.   On: 05/28/2017 23:53     Assessment/Plan: 67 year old female presenting with altered mental status that appears to have been progressive over the past 2-3 weeks but it is unclear what her status was prior to then and if she may have had some mild cognitive findings.  It appears that she was at least able to work and drive.  Unclear if there were any concerns at work, etc. Currently on the surface performs rather well but as more detail testing is done she clearly has some confusion which has improved since yesterday  MRI brain, lab work up, EEG  no acute abnormalities.   Possibly periods of delirium Pt needs to follow up with neurology as out pt for cognifitive testing.   D/c planning Spoken with daughter at bedside.    Leotis Pain

## 2017-05-31 LAB — RPR: RPR Ser Ql: REACTIVE — AB

## 2017-05-31 LAB — RPR, QUANT+TP ABS (REFLEX)
Rapid Plasma Reagin, Quant: 1:64 {titer} — ABNORMAL HIGH
TREPONEMA PALLIDUM AB: POSITIVE — AB

## 2017-06-01 ENCOUNTER — Encounter: Payer: Self-pay | Admitting: Nurse Practitioner

## 2017-06-01 ENCOUNTER — Ambulatory Visit: Payer: 59 | Admitting: Nurse Practitioner

## 2017-06-01 VITALS — BP 130/80 | HR 71 | Resp 16 | Ht 66.0 in | Wt 179.2 lb

## 2017-06-01 DIAGNOSIS — R4182 Altered mental status, unspecified: Secondary | ICD-10-CM

## 2017-06-01 DIAGNOSIS — R0989 Other specified symptoms and signs involving the circulatory and respiratory systems: Secondary | ICD-10-CM | POA: Diagnosis not present

## 2017-06-01 DIAGNOSIS — F10259 Alcohol dependence with alcohol-induced psychotic disorder, unspecified: Secondary | ICD-10-CM

## 2017-06-01 DIAGNOSIS — F17200 Nicotine dependence, unspecified, uncomplicated: Secondary | ICD-10-CM | POA: Diagnosis not present

## 2017-06-01 DIAGNOSIS — I10 Essential (primary) hypertension: Secondary | ICD-10-CM

## 2017-06-01 LAB — RPR: RPR: REACTIVE — AB

## 2017-06-01 LAB — RPR, QUANT+TP ABS (REFLEX): TREPONEMA PALLIDUM AB: POSITIVE — AB

## 2017-06-01 MED ORDER — AMLODIPINE BESYLATE 10 MG PO TABS: 10.0000 mg | ORAL_TABLET | Freq: Every day | ORAL | 4 refills | Status: DC

## 2017-06-01 MED ORDER — FOLIC ACID 1 MG PO TABS: 1.0000 mg | ORAL_TABLET | Freq: Every day | ORAL | 3 refills | Status: DC

## 2017-06-01 MED ORDER — NICOTINE 14 MG/24HR TD PT24: MEDICATED_PATCH | TRANSDERMAL | 2 refills | Status: DC

## 2017-06-01 MED ORDER — VITAMIN B-1 100 MG PO TABS: 100.0000 mg | ORAL_TABLET | Freq: Every day | ORAL | 3 refills | Status: DC

## 2017-06-01 NOTE — Progress Notes (Signed)
[facility] [place], [place] [address]  Internal MEDICINE  Office Visit Note  Patient Name: Natalie Knox  [id number]  [id number]  Date of Service: 06/20/2017     No chief complaint on file.    The patient is here for hospital follow up. Was taken to ER due to altered mental status. Stayed for two nights. Diagnosis is dementia Vs. Delerium. They recommended she follow up with neurology. Recommended Dr. Melrose Nakayama. Testing in hospital showed some dehydration, possibly alcohol withdrawal. Her daughter is in the office with her today. States that the patient is a frequent drinker. Will drink sort of heavily, three to four nights per week.    Pt is here for recent hospital follow up.  Current Medication: Outpatient Encounter Medications as of 06/01/2017  Medication Sig  . amLODipine (NORVASC) 10 MG tablet Take 1 tablet (10 mg total) by mouth daily.  [name] docusate sodium (COLACE) 100 MG capsule Take 1 capsule (100 mg total) by mouth 2 (two) times daily as needed for mild constipation.  . folic acid (FOLVITE) 1 MG tablet Take 1 tablet (1 mg total) by mouth daily.  . Multiple Vitamins-Minerals (MULTIVITAMIN WITH MINERALS) tablet Take 1 tablet by mouth daily.  . naproxen (NAPROSYN) 500 MG tablet Take 500 mg by mouth 2 (two) times daily as needed.  . pravastatin (PRAVACHOL) 40 MG tablet Take 40 mg by mouth at bedtime.   . thiamine (VITAMIN B-1) 100 MG tablet Take 1 tablet (100 mg total) by mouth daily.  . [DISCONTINUED] amLODipine (NORVASC) 10 MG tablet Take 1 tablet (10 mg total) by mouth daily.  . [DISCONTINUED] folic acid (FOLVITE) 1 MG tablet Take 1 tablet (1 mg total) by mouth daily.  . [DISCONTINUED] thiamine (VITAMIN B-1) 100 MG tablet Take 1 tablet (100 mg total) by mouth daily.  . nicotine (NICODERM CQ) 14 mg/24hr patch Apply one patch to skin QD prn smoking cessation.  . [DISCONTINUED] lisinopril-hydrochlorothiazide (PRINZIDE,ZESTORETIC) 20-12.5 MG tablet Take 1  tablet by mouth daily.   No facility-administered encounter medications on file as of 06/01/2017.     Surgical History: Past Surgical History:  Procedure Laterality Date  . UPPER GI ENDOSCOPY      Medical History: Past Medical History:  Diagnosis Date  . Allergic rhinitis   . Hyperlipidemia   . Hypertension     Family History: No family history on file.  Social History   Socioeconomic History  . Marital status: Single    Spouse name: Not on file  . Number of children: Not on file  . Years of education: Not on file  . Highest education level: Not on file  Occupational History  . Not on file  Social Needs  . Financial resource strain: Not on file  . Food insecurity:    Worry: Not on file    Inability: Not on file  . Transportation needs:    Medical: Not on file    Non-medical: Not on file  Tobacco Use  . Smoking status: Current Every Day Smoker    Packs/day: 0.50    Types: Cigarettes  . Smokeless tobacco: Never Used  Substance and Sexual Activity  . Alcohol use: Yes    Frequency: Never    Comment: two days a week  . Drug use: No  . Sexual activity: Not on file  Lifestyle  . Physical activity:    Days per week: Not on file    Minutes per session: Not on file  . Stress:  Not on file  Relationships  . Social connections:    Talks on phone: Not on file    Gets together: Not on file    Attends religious service: Not on file    Active member of club or organization: Not on file    Attends meetings of clubs or organizations: Not on file    Relationship status: Not on file  . Intimate partner violence:    Fear of current or ex partner: Not on file    Emotionally abused: Not on file    Physically abused: Not on file    Forced sexual activity: Not on file  Other Topics Concern  . Not on file  Social History Narrative  . Not on file      Review of Systems  Constitutional: Positive for activity change, appetite change and fatigue. Negative for chills and  unexpected weight change.  HENT: Negative for congestion, postnasal drip, rhinorrhea, sneezing and sore throat.   Eyes: Negative.  Negative for redness.  Respiratory: Negative for cough, chest tightness, shortness of breath and wheezing.   Cardiovascular: Negative for chest pain and palpitations.  Gastrointestinal: Negative for abdominal pain, constipation, diarrhea, nausea and vomiting.  Endocrine: Negative for cold intolerance, heat intolerance, polydipsia, polyphagia and polyuria.  Genitourinary: Negative.  Negative for dysuria and frequency.  Musculoskeletal: Negative for arthralgias, back pain, joint swelling and neck pain.  Skin: Negative for rash.  Neurological: Positive for dizziness, tremors, weakness and light-headedness. Negative for numbness.  Hematological: Negative for adenopathy. Does not bruise/bleed easily.  Psychiatric/Behavioral: Positive for behavioral problems (Depression), confusion, dysphoric mood and hallucinations. Negative for sleep disturbance and suicidal ideas. The patient is nervous/anxious.     Today's Vitals   06/01/17 1027  BP: 130/80  Pulse: 71  Resp: 16  SpO2: 98%  Weight: 179 lb 3.2 oz (81.3 kg)  Height: 5\' 6"  (1.676 m)    Physical Exam  Constitutional: She is oriented to person, place, and time. She appears well-developed and well-nourished. No distress.  HENT:  Head: Normocephalic and atraumatic.  Mouth/Throat: Oropharynx is clear and moist. No oropharyngeal exudate.  Eyes: Pupils are equal, round, and reactive to light. EOM are normal.  Neck: Normal range of motion. Neck supple. No JVD present. Carotid bruit is not present. No tracheal deviation present. No thyromegaly present.  Cardiovascular: Normal rate, regular rhythm and normal heart sounds. Exam reveals no gallop and no friction rub.  No murmur heard. Pulmonary/Chest: Effort normal and breath sounds normal. No respiratory distress. She has no wheezes. She has no rales. She exhibits no  tenderness.  Abdominal: Soft. Bowel sounds are normal. There is no tenderness.  Musculoskeletal: Normal range of motion.  Lymphadenopathy:    She has no cervical adenopathy.  Neurological: She is alert and oriented to person, place, and time. No cranial nerve deficit.  She is at neurological baseline since leaving geriatric psychiaric facility.   Skin: Skin is warm and dry. She is not diaphoretic.  Psychiatric: Her behavior is normal. Judgment and thought content normal. Her mood appears anxious. Her speech is delayed. Cognition and memory are impaired. She exhibits a depressed mood. She expresses no homicidal and no suicidal ideation.  Nursing note and vitals reviewed.  Assessment/Plan: 1. Altered mental status, unspecified altered mental status type Reviewed all discharge insructions and paperwork from geriatric psychiatric facility. Will refer to neurology for further evaluation.  - Ambulatory referral to Neurology - US Carotid Duplex Bilateral; Future  2. Alcohol dependence with alcohol-induced psychotic disorder  with complication ([place]) Continue with folic acid annd thiamine replacement daily.  - folic acid (FOLVITE) 1 MG tablet; Take 1 tablet (1 mg total) by mouth daily.  Dispense: 30 tablet; Refill: 3 - thiamine (VITAMIN B-1) 100 MG tablet; Take 1 tablet (100 mg total) by mouth daily.  Dispense: 30 tablet; Refill: 3  3. Jugular venous distension Will get carotid doppler ultrasound for further evaluation.  - US Carotid Duplex Bilateral; Future  4. Essential hypertension - amLODipine (NORVASC) 10 MG tablet; Take 1 tablet (10 mg total) by mouth daily.  Dispense: 90 tablet; Refill: 4  5. Tobacco dependence Will start nicotine patch. instructions for use were provided to the patient and her daughter.  - nicotine (NICODERM CQ) 14 mg/24hr patch; Apply one patch to skin QD prn smoking cessation.  Dispense: 28 patch; Refill: 2   General Counseling: Bryahna verbalizes understanding of the  findings of todays visit and agrees with plan of treatment. I have discussed any further diagnostic evaluation that may be needed or ordered today. We also reviewed her medications today. she has been encouraged to call the office with any questions or concerns that should arise related to todays visit.   This patient was seen by Leretha Pol, FNP- C in Collaboration with Dr Lavera Guise as a part of collaborative care agreement    Orders Placed This Encounter  Procedures  . US Carotid Duplex Bilateral  . Ambulatory referral to Neurology      I have reviewed all medical records from hospital follow up including radiology reports and consults from other physicians. Appropriate follow up diagnostics will be scheduled as needed. Patient/ Family understands the plan of treatment. Time spent 30 minutes.   Dr Lavera Guise, MD Internal Medicine

## 2017-06-02 ENCOUNTER — Encounter: Payer: Self-pay | Admitting: Nurse Practitioner

## 2017-06-03 DIAGNOSIS — R4182 Altered mental status, unspecified: Secondary | ICD-10-CM | POA: Diagnosis not present

## 2017-06-03 DIAGNOSIS — E538 Deficiency of other specified B group vitamins: Secondary | ICD-10-CM | POA: Diagnosis not present

## 2017-06-05 ENCOUNTER — Telehealth: Payer: Self-pay

## 2017-06-05 DIAGNOSIS — A523 Neurosyphilis, unspecified: Secondary | ICD-10-CM | POA: Diagnosis not present

## 2017-06-05 DIAGNOSIS — Z114 Encounter for screening for human immunodeficiency virus [HIV]: Secondary | ICD-10-CM | POA: Diagnosis not present

## 2017-06-05 DIAGNOSIS — R410 Disorientation, unspecified: Secondary | ICD-10-CM | POA: Diagnosis not present

## 2017-06-05 NOTE — Telephone Encounter (Signed)
Spoke with pt daughter about she send message through [place] and pt advised her that its already  Taken care about result and we did neurologist

## 2017-06-08 ENCOUNTER — Other Ambulatory Visit: Payer: Self-pay | Admitting: Infectious Diseases

## 2017-06-08 DIAGNOSIS — A523 Neurosyphilis, unspecified: Secondary | ICD-10-CM

## 2017-06-11 ENCOUNTER — Encounter: Payer: Self-pay | Admitting: Nurse Practitioner

## 2017-06-11 DIAGNOSIS — Z0100 Encounter for examination of eyes and vision without abnormal findings: Secondary | ICD-10-CM | POA: Diagnosis not present

## 2017-06-11 DIAGNOSIS — D3132 Benign neoplasm of left choroid: Secondary | ICD-10-CM | POA: Diagnosis not present

## 2017-06-11 DIAGNOSIS — A523 Neurosyphilis, unspecified: Secondary | ICD-10-CM | POA: Diagnosis not present

## 2017-06-11 DIAGNOSIS — H524 Presbyopia: Secondary | ICD-10-CM | POA: Diagnosis not present

## 2017-06-12 ENCOUNTER — Other Ambulatory Visit: Payer: Self-pay

## 2017-06-12 ENCOUNTER — Other Ambulatory Visit: Payer: Self-pay | Admitting: Nurse Practitioner

## 2017-06-12 DIAGNOSIS — I10 Essential (primary) hypertension: Secondary | ICD-10-CM

## 2017-06-12 MED ORDER — LISINOPRIL 10 MG PO TABS: 10.0000 mg | ORAL_TABLET | Freq: Every day | ORAL | 3 refills | Status: DC

## 2017-06-12 NOTE — Telephone Encounter (Signed)
Due to persistent elevated blood presure, I added lisinopril 10mg  daily. New rx was sent to [facility]. We can reassess at next visit. Continue to monitor closely at home.

## 2017-06-12 NOTE — Telephone Encounter (Signed)
PT SEND MESSAGE THROUGH MY CHART THAT BP IS HIGH I GAVE HEATHER MESSAGE SHE ADD LISINOPRIL AND ADVISED PT DAUGTHER TO MONITOR BP

## 2017-06-12 NOTE — Progress Notes (Signed)
Due to persistent elevated blood presure, I added lisinopril 10mg  daily. New rx was sent to [facility]. We can reassess at next visit. Continue to monitor closely at home.

## 2017-06-15 ENCOUNTER — Ambulatory Visit
Admission: RE | Admit: 2017-06-15 | Discharge: 2017-06-15 | Disposition: A | Discharge status: Home / Self Care | Payer: Medicare HMO | Source: Ambulatory Visit | Attending: Infectious Diseases | Admitting: Infectious Diseases

## 2017-06-15 DIAGNOSIS — A523 Neurosyphilis, unspecified: Secondary | ICD-10-CM | POA: Diagnosis not present

## 2017-06-15 DIAGNOSIS — R413 Other amnesia: Secondary | ICD-10-CM | POA: Diagnosis not present

## 2017-06-15 LAB — CSF CELL COUNT WITH DIFFERENTIAL
Eosinophils, CSF: 0 %
Eosinophils, CSF: 0 %
LYMPHS CSF: 90 %
Lymphs, CSF: 92 %
MONOCYTE-MACROPHAGE-SPINAL FLUID: 6 %
Monocyte-Macrophage-Spinal Fluid: 9 %
OTHER CELLS CSF: 0
Other Cells, CSF: 0
RBC COUNT CSF: 17 /mm3 — AB (ref 0–3)
RBC Count, CSF: 28 /mm3 — ABNORMAL HIGH (ref 0–3)
Segmented Neutrophils-CSF: 1 %
Segmented Neutrophils-CSF: 2 %
TUBE #: 1
TUBE #: 3
WBC, CSF: 32 /mm3 (ref 0–5)
WBC, CSF: 71 /mm3 (ref 0–5)

## 2017-06-15 LAB — PROTEIN, CSF: TOTAL PROTEIN, CSF: 58 mg/dL — AB (ref 15–45)

## 2017-06-15 LAB — GLUCOSE, CSF: Glucose, CSF: 43 mg/dL (ref 40–70)

## 2017-06-15 MED ORDER — LIDOCAINE HCL (PF) 1 % IJ SOLN
10.0000 mL | Freq: Once | INTRAMUSCULAR | Status: DC
  Filled 2017-06-15: qty 10

## 2017-06-15 NOTE — Progress Notes (Signed)
Patient ID: Natalie Knox, female   DOB: 1950-12-18, 67 y.o.   MRN: [id number]   Order checked. MR and CT reviewed.  Procedure and associated risks reviewed with patient (who is aware of time, place and procedure).  Question answered.  Written consent obtained.  Time out performed.  Under fluoroscopic guidance and aseptic technique with 1% Lidocaine local anesthetic, L3-4 performed with single pass of 22 g spinal needle.  OP - 110 mm water.  6 cc clear CSF collected.  No immediate complications.

## 2017-06-16 ENCOUNTER — Ambulatory Visit
Admission: RE | Admit: 2017-06-16 | Discharge: 2017-06-16 | Disposition: A | Discharge status: Home / Self Care | Payer: Medicare HMO | Source: Ambulatory Visit | Attending: Infectious Diseases | Admitting: Infectious Diseases

## 2017-06-16 ENCOUNTER — Ambulatory Visit
Admission: RE | Admit: 2017-06-16 | Discharge: 2017-06-16 | Disposition: A | Discharge status: Home / Self Care | Payer: Self-pay | Source: Ambulatory Visit | Attending: Infectious Diseases | Admitting: Infectious Diseases

## 2017-06-16 DIAGNOSIS — I878 Other specified disorders of veins: Secondary | ICD-10-CM | POA: Diagnosis not present

## 2017-06-16 LAB — VDRL, CSF: VDRL Quant, CSF: 1:16 {titer} — ABNORMAL HIGH

## 2017-06-16 MED ORDER — SODIUM CHLORIDE 0.9% FLUSH: 10.0000 mL | INTRAVENOUS | Status: DC | PRN

## 2017-06-16 MED ORDER — SODIUM CHLORIDE 0.9% FLUSH: 10.0000 mL | Freq: Two times a day (BID) | INTRAVENOUS | Status: DC

## 2017-06-16 MED ORDER — PENICILLIN G POTASSIUM 5000000 UNITS IJ SOLR
4.0000 10*6.[IU] | Freq: Once | INTRAVENOUS | Status: AC
  Administered 2017-06-16: 4 10*6.[IU] via INTRAVENOUS
  Filled 2017-06-16: qty 4

## 2017-06-16 MED ORDER — HEPARIN SOD (PORK) LOCK FLUSH 100 UNIT/ML IV SOLN
INTRAVENOUS | Status: AC
  Administered 2017-06-16: 500 [IU]
  Filled 2017-06-16: qty 5

## 2017-06-16 NOTE — Progress Notes (Signed)
Peripherally Inserted Central Catheter/Midline Placement  The IV Nurse has discussed with the patient and/or persons authorized to consent for the patient, the purpose of this procedure and the potential benefits and risks involved with this procedure.  The benefits include less needle sticks, lab draws from the catheter, and the patient may be discharged home with the catheter. Risks include, but not limited to, infection, bleeding, blood clot (thrombus formation), and puncture of an artery; nerve damage and irregular heartbeat and possibility to perform a PICC exchange if needed/ordered by physician.  Alternatives to this procedure were also discussed.  Bard Power PICC patient education guide, fact sheet on infection prevention and patient information card has been provided to patient /or left at bedside.    PICC/Midline Placement Documentation  PICC Single Lumen 34/28/76 PICC Right Basilic 40 cm 0 cm (Active)  Indication for Insertion or Continuance of Line Home intravenous therapies (PICC only) 06/16/2017  2:00 PM  Exposed Catheter (cm) 0 cm 06/16/2017  2:00 PM  Site Assessment Clean;Dry;Intact 06/16/2017  2:00 PM  Line Status Flushed;Blood return noted 06/16/2017  2:00 PM  Dressing Type Transparent 06/16/2017  2:00 PM  Dressing Status Clean;Dry;Intact;Antimicrobial disc in place 06/16/2017  2:00 PM  Dressing Change Due 06/23/17 06/16/2017  2:00 PM       Jule Economy Horton 06/16/2017, 2:04 PM

## 2017-06-17 DIAGNOSIS — G9341 Metabolic encephalopathy: Secondary | ICD-10-CM | POA: Diagnosis not present

## 2017-06-17 DIAGNOSIS — F17210 Nicotine dependence, cigarettes, uncomplicated: Secondary | ICD-10-CM | POA: Diagnosis not present

## 2017-06-17 DIAGNOSIS — E538 Deficiency of other specified B group vitamins: Secondary | ICD-10-CM | POA: Diagnosis not present

## 2017-06-17 DIAGNOSIS — Z792 Long term (current) use of antibiotics: Secondary | ICD-10-CM | POA: Diagnosis not present

## 2017-06-17 DIAGNOSIS — I10 Essential (primary) hypertension: Secondary | ICD-10-CM | POA: Diagnosis not present

## 2017-06-17 DIAGNOSIS — A523 Neurosyphilis, unspecified: Secondary | ICD-10-CM | POA: Diagnosis not present

## 2017-06-17 DIAGNOSIS — Z452 Encounter for adjustment and management of vascular access device: Secondary | ICD-10-CM | POA: Diagnosis not present

## 2017-06-19 ENCOUNTER — Emergency Department: Payer: Medicare HMO

## 2017-06-19 ENCOUNTER — Other Ambulatory Visit: Payer: Self-pay

## 2017-06-19 ENCOUNTER — Encounter: Payer: Self-pay | Admitting: Emergency Medicine

## 2017-06-19 ENCOUNTER — Emergency Department
Admission: EM | Admit: 2017-06-19 | Discharge: 2017-06-19 | Disposition: A | Discharge status: Home / Self Care | Payer: Medicare HMO | Attending: Emergency Medicine | Admitting: Emergency Medicine

## 2017-06-19 DIAGNOSIS — F17210 Nicotine dependence, cigarettes, uncomplicated: Secondary | ICD-10-CM | POA: Insufficient documentation

## 2017-06-19 DIAGNOSIS — Z959 Presence of cardiac and vascular implant and graft, unspecified: Secondary | ICD-10-CM | POA: Diagnosis not present

## 2017-06-19 DIAGNOSIS — R079 Chest pain, unspecified: Secondary | ICD-10-CM | POA: Insufficient documentation

## 2017-06-19 DIAGNOSIS — Z79899 Other long term (current) drug therapy: Secondary | ICD-10-CM | POA: Insufficient documentation

## 2017-06-19 DIAGNOSIS — I10 Essential (primary) hypertension: Secondary | ICD-10-CM | POA: Insufficient documentation

## 2017-06-19 LAB — BASIC METABOLIC PANEL
Anion gap: 10 (ref 5–15)
BUN: 15 mg/dL (ref 6–20)
CHLORIDE: 109 mmol/L (ref 101–111)
CO2: 21 mmol/L — ABNORMAL LOW (ref 22–32)
CREATININE: 0.91 mg/dL (ref 0.44–1.00)
Calcium: 9.6 mg/dL (ref 8.9–10.3)
GFR calc Af Amer: >60 mL/min (ref >60)
GFR calc non Af Amer: >60 mL/min (ref >60)
GLUCOSE: 96 mg/dL (ref 65–99)
POTASSIUM: 3.9 mmol/L (ref 3.5–5.1)
Sodium: 140 mmol/L (ref 135–145)

## 2017-06-19 LAB — TROPONIN I: Troponin I: <0.03 ng/mL (ref <0.03)

## 2017-06-19 LAB — CBC
HCT: 32.5 % — ABNORMAL LOW (ref 35.0–47.0)
Hemoglobin: 10.7 g/dL — ABNORMAL LOW (ref 12.0–16.0)
MCH: 29.2 pg (ref 26.0–34.0)
MCHC: 33 g/dL (ref 32.0–36.0)
MCV: 88.4 fL (ref 80.0–100.0)
PLATELETS: 434 10*3/uL (ref 150–440)
RBC: 3.68 MIL/uL — ABNORMAL LOW (ref 3.80–5.20)
RDW: 15.8 % — AB (ref 11.5–14.5)
WBC: 6.2 10*3/uL (ref 3.6–11.0)

## 2017-06-19 NOTE — ED Provider Notes (Signed)
[facility] Emergency Department Provider Note  Time seen: 6:48 PM  I have reviewed the triage vital signs and the nursing notes.   HISTORY  Chief Complaint Chest Pain    HPI Natalie Knox is a 67 y.o. female with a past medical history of hypertension, hyperlipidemia, osteomyelitis on IV antibiotics via a PICC line who presents to the emergency department with right-sided chest pain.  According to the patient for the past 3-4 days she has intermittently been experiencing pain in her right upper chest around her right clavicle.  States the pain will happen in the morning and in the evenings, last several minutes and then resolved.  Denies any inciting events.  Denies any pleuritic pain.  Denies any leg pain or swelling.  Denies any nausea shortness of breath or diaphoresis.  Patient has a PICC line, the home health nurse who infuses the antibiotics was concerned that possibly the PICC line had been displaced and send her to the ER for an x-ray.  Currently the patient appears well, no distress, lying in bed comfortably with no complaints.  Denies any chest pain currently.   Past Medical History:  Diagnosis Date  . Allergic rhinitis   . Hyperlipidemia   . Hypertension     Patient Active Problem List   Diagnosis Date Noted  . Metabolic encephalopathy 18/29/9371  . Altered mental status     Past Surgical History:  Procedure Laterality Date  . UPPER GI ENDOSCOPY      Prior to Admission medications   Medication Sig Start Date End Date Taking? Authorizing Provider  amLODipine (NORVASC) 10 MG tablet Take 1 tablet (10 mg total) by mouth daily. 06/01/17   Ronnell Freshwater, NP  docusate sodium (COLACE) 100 MG capsule Take 1 capsule (100 mg total) by mouth 2 (two) times daily as needed for mild constipation. 05/30/17   Nicholes Mango, MD  folic acid (FOLVITE) 1 MG tablet Take 1 tablet (1 mg total) by mouth daily. 06/01/17 09/29/17  Ronnell Freshwater, NP  lisinopril  (PRINIVIL,ZESTRIL) 10 MG tablet Take 1 tablet (10 mg total) by mouth daily. 06/12/17   Ronnell Freshwater, NP  Multiple Vitamins-Minerals (MULTIVITAMIN WITH MINERALS) tablet Take 1 tablet by mouth daily. 05/30/17 05/30/18  Nicholes Mango, MD  naproxen (NAPROSYN) 500 MG tablet Take 500 mg by mouth 2 (two) times daily as needed.    [provider]  nicotine (NICODERM CQ) 14 mg/24hr patch Apply one patch to skin QD prn smoking cessation. 06/01/17   Ronnell Freshwater, NP  pravastatin (PRAVACHOL) 40 MG tablet Take 40 mg by mouth at bedtime.  05/22/17   [provider]  thiamine (VITAMIN B-1) 100 MG tablet Take 1 tablet (100 mg total) by mouth daily. 06/01/17   Ronnell Freshwater, NP    No Known Allergies  No family history on file.  Social History Social History   Tobacco Use  . Smoking status: Current Every Day Smoker    Packs/day: 0.50    Types: Cigarettes  . Smokeless tobacco: Never Used  Substance Use Topics  . Alcohol use: Yes    Frequency: Never    Comment: two days a week  . Drug use: No    Review of Systems Constitutional: Negative for fever. Eyes: Negative for visual complaints ENT: Negative for recent illness/congestion Cardiovascular: Intermittent right-sided chest pain.  None currently. Respiratory: Negative for shortness of breath.  Negative for pleuritic pain. Gastrointestinal: Negative for abdominal pain, vomiting  Genitourinary: Negative  for urinary compaints Musculoskeletal: Negative for leg pain or swelling Skin: Negative for skin complaints  Neurological: Negative for headache All other ROS negative  ____________________________________________   PHYSICAL EXAM:  VITAL SIGNS: ED Triage Vitals  Enc Vitals Group     BP 06/19/17 1719 (!) 192/67     Pulse Rate 06/19/17 1719 97     Resp 06/19/17 1719 16     Temp 06/19/17 1719 98.8 F (37.1 C)     Temp Source 06/19/17 1719 Oral     SpO2 06/19/17 1719 100 %     Weight 06/19/17 1718 179 lb (81.2 kg)      Height 06/19/17 1718 5\' 6"  (1.676 m)     Head Circumference --      Peak Flow --      Pain Score 06/19/17 1717 0     Pain Loc --      Pain Edu? --      Excl. in [place]? --     Constitutional: Alert and oriented. Well appearing and in no distress. Eyes: Normal exam ENT   Head: Normocephalic and atraumatic.   Mouth/Throat: Mucous membranes are moist. Cardiovascular: Normal rate, regular rhythm.  Respiratory: Normal respiratory effort without tachypnea nor retractions. Breath sounds are clear Gastrointestinal: Soft and nontender. No distention. Musculoskeletal: Nontender with normal range of motion in all extremities. No lower extremity tenderness or edema.  Neurologic:  Normal speech and language. No gross focal neurologic deficits Skin:  Skin is warm, dry and intact.  Psychiatric: Mood and affect are normal.   ____________________________________________    EKG  EKG reviewed and interpreted by myself shows normal sinus rhythm 89 bpm with a narrow QRS, normal axis, normal intervals, nonspecific but no concerning ST changes.  ____________________________________________    RADIOLOGY  X-ray is normal.  On my evaluation the PICC line appears well placed.  ____________________________________________   INITIAL IMPRESSION / ASSESSMENT AND PLAN / ED COURSE  Pertinent labs & imaging results that were available during my care of the patient were reviewed by me and considered in my medical decision making (see chart for details).  Patient presents to the emergency department for intermittent right-sided chest pain occurring over the past 3 or 4 days.  None currently.  Negative for shortness of breath nausea or diaphoresis.  Differential would include ACS, chest wall pain, displaced PICC line.  Overall the patient appears very well, no distress.  EKG is reassuring, chest x-ray is reassuring.  Patient appears very well.  Denies any complaints at this time.  Denies any chest pain.   Labs including cardiac enzymes are normal.  Discussed with the patient cardiology follow-up for stress test as well as PCP follow-up.  Provided my normal chest pain return precautions.  Patient will be discharged home.  ____________________________________________   FINAL CLINICAL IMPRESSION(S) / ED DIAGNOSES  Chest pain    Harvest Dark, MD 06/19/17 [phone]

## 2017-06-19 NOTE — ED Triage Notes (Signed)
Pt to ED via POV with daughter c/o chest pain that started yesterday. Pt is located on the right side of her chest. Pt denies radiation of the pain. Pt denies N/V, dizziness, or diaphoresis. Daughter states that the home health nurse wanted pt to come to have PICC line evaluated to be sure it was still in place. Pt is in NAD at this time.

## 2017-06-19 NOTE — Discharge Instructions (Addendum)
You have been seen in the emergency department today for chest pain. Your workup has shown normal results. As we discussed please follow-up with your primary care physician in the next 1-2 days for recheck. Return to the emergency department for any further chest pain, trouble breathing, or any other symptom personally concerning to yourself. °

## 2017-06-20 DIAGNOSIS — F10259 Alcohol dependence with alcohol-induced psychotic disorder, unspecified: Secondary | ICD-10-CM | POA: Insufficient documentation

## 2017-06-20 DIAGNOSIS — I10 Essential (primary) hypertension: Secondary | ICD-10-CM | POA: Insufficient documentation

## 2017-06-20 DIAGNOSIS — R0989 Other specified symptoms and signs involving the circulatory and respiratory systems: Secondary | ICD-10-CM | POA: Insufficient documentation

## 2017-06-20 DIAGNOSIS — F17200 Nicotine dependence, unspecified, uncomplicated: Secondary | ICD-10-CM | POA: Insufficient documentation

## 2017-06-23 DIAGNOSIS — A523 Neurosyphilis, unspecified: Secondary | ICD-10-CM | POA: Diagnosis not present

## 2017-06-23 DIAGNOSIS — Z792 Long term (current) use of antibiotics: Secondary | ICD-10-CM | POA: Diagnosis not present

## 2017-06-24 DIAGNOSIS — A523 Neurosyphilis, unspecified: Secondary | ICD-10-CM | POA: Diagnosis not present

## 2017-06-25 DIAGNOSIS — F17210 Nicotine dependence, cigarettes, uncomplicated: Secondary | ICD-10-CM | POA: Diagnosis not present

## 2017-06-25 DIAGNOSIS — Z792 Long term (current) use of antibiotics: Secondary | ICD-10-CM | POA: Diagnosis not present

## 2017-06-25 DIAGNOSIS — Z452 Encounter for adjustment and management of vascular access device: Secondary | ICD-10-CM | POA: Diagnosis not present

## 2017-06-25 DIAGNOSIS — E538 Deficiency of other specified B group vitamins: Secondary | ICD-10-CM | POA: Diagnosis not present

## 2017-06-25 DIAGNOSIS — A523 Neurosyphilis, unspecified: Secondary | ICD-10-CM | POA: Diagnosis not present

## 2017-06-25 DIAGNOSIS — I10 Essential (primary) hypertension: Secondary | ICD-10-CM | POA: Diagnosis not present

## 2017-06-25 DIAGNOSIS — G9341 Metabolic encephalopathy: Secondary | ICD-10-CM | POA: Diagnosis not present

## 2017-06-26 ENCOUNTER — Other Ambulatory Visit: Payer: Self-pay

## 2017-06-30 DIAGNOSIS — Z452 Encounter for adjustment and management of vascular access device: Secondary | ICD-10-CM | POA: Diagnosis not present

## 2017-06-30 DIAGNOSIS — A523 Neurosyphilis, unspecified: Secondary | ICD-10-CM | POA: Diagnosis not present

## 2017-06-30 DIAGNOSIS — F17210 Nicotine dependence, cigarettes, uncomplicated: Secondary | ICD-10-CM | POA: Diagnosis not present

## 2017-06-30 DIAGNOSIS — Z792 Long term (current) use of antibiotics: Secondary | ICD-10-CM | POA: Diagnosis not present

## 2017-06-30 DIAGNOSIS — G9341 Metabolic encephalopathy: Secondary | ICD-10-CM | POA: Diagnosis not present

## 2017-06-30 DIAGNOSIS — E538 Deficiency of other specified B group vitamins: Secondary | ICD-10-CM | POA: Diagnosis not present

## 2017-06-30 DIAGNOSIS — I10 Essential (primary) hypertension: Secondary | ICD-10-CM | POA: Diagnosis not present

## 2017-07-01 DIAGNOSIS — G9341 Metabolic encephalopathy: Secondary | ICD-10-CM | POA: Diagnosis not present

## 2017-07-01 DIAGNOSIS — E538 Deficiency of other specified B group vitamins: Secondary | ICD-10-CM | POA: Diagnosis not present

## 2017-07-01 DIAGNOSIS — Z452 Encounter for adjustment and management of vascular access device: Secondary | ICD-10-CM | POA: Diagnosis not present

## 2017-07-01 DIAGNOSIS — F17210 Nicotine dependence, cigarettes, uncomplicated: Secondary | ICD-10-CM | POA: Diagnosis not present

## 2017-07-01 DIAGNOSIS — Z792 Long term (current) use of antibiotics: Secondary | ICD-10-CM | POA: Diagnosis not present

## 2017-07-01 DIAGNOSIS — A523 Neurosyphilis, unspecified: Secondary | ICD-10-CM | POA: Diagnosis not present

## 2017-07-01 DIAGNOSIS — I10 Essential (primary) hypertension: Secondary | ICD-10-CM | POA: Diagnosis not present

## 2017-07-14 DIAGNOSIS — E538 Deficiency of other specified B group vitamins: Secondary | ICD-10-CM | POA: Diagnosis not present

## 2017-07-14 DIAGNOSIS — Z8619 Personal history of other infectious and parasitic diseases: Secondary | ICD-10-CM | POA: Insufficient documentation

## 2017-07-14 DIAGNOSIS — R4182 Altered mental status, unspecified: Secondary | ICD-10-CM | POA: Diagnosis not present

## 2017-07-14 DIAGNOSIS — Z8661 Personal history of infections of the central nervous system: Secondary | ICD-10-CM | POA: Diagnosis not present

## 2017-07-15 ENCOUNTER — Encounter: Payer: Self-pay | Admitting: Nurse Practitioner

## 2017-07-16 ENCOUNTER — Other Ambulatory Visit: Payer: Self-pay

## 2017-07-16 DIAGNOSIS — I10 Essential (primary) hypertension: Secondary | ICD-10-CM

## 2017-07-16 MED ORDER — LISINOPRIL 10 MG PO TABS: 10.0000 mg | ORAL_TABLET | Freq: Every day | ORAL | 3 refills | Status: DC

## 2017-07-20 ENCOUNTER — Ambulatory Visit: Payer: Self-pay | Admitting: Nurse Practitioner

## 2017-07-21 ENCOUNTER — Other Ambulatory Visit: Payer: Self-pay | Admitting: Nurse Practitioner

## 2017-07-21 DIAGNOSIS — Z1231 Encounter for screening mammogram for malignant neoplasm of breast: Secondary | ICD-10-CM

## 2017-07-27 ENCOUNTER — Emergency Department
Admission: EM | Admit: 2017-07-27 | Discharge: 2017-07-27 | Disposition: A | Discharge status: Home / Self Care | Payer: Medicare HMO | Attending: Emergency Medicine | Admitting: Emergency Medicine

## 2017-07-27 ENCOUNTER — Encounter: Payer: Self-pay | Admitting: Emergency Medicine

## 2017-07-27 DIAGNOSIS — F17210 Nicotine dependence, cigarettes, uncomplicated: Secondary | ICD-10-CM | POA: Insufficient documentation

## 2017-07-27 DIAGNOSIS — Z79899 Other long term (current) drug therapy: Secondary | ICD-10-CM | POA: Insufficient documentation

## 2017-07-27 DIAGNOSIS — R5381 Other malaise: Secondary | ICD-10-CM | POA: Insufficient documentation

## 2017-07-27 DIAGNOSIS — I10 Essential (primary) hypertension: Secondary | ICD-10-CM | POA: Insufficient documentation

## 2017-07-27 DIAGNOSIS — M6281 Muscle weakness (generalized): Secondary | ICD-10-CM | POA: Diagnosis not present

## 2017-07-27 DIAGNOSIS — R531 Weakness: Secondary | ICD-10-CM

## 2017-07-27 LAB — BASIC METABOLIC PANEL
Anion gap: 8 (ref 5–15)
BUN: 15 mg/dL (ref 6–20)
CHLORIDE: 111 mmol/L (ref 101–111)
CO2: 23 mmol/L (ref 22–32)
Calcium: 9.2 mg/dL (ref 8.9–10.3)
Creatinine, Ser: 0.83 mg/dL (ref 0.44–1.00)
GFR calc Af Amer: >60 mL/min (ref >60)
GFR calc non Af Amer: >60 mL/min (ref >60)
Glucose, Bld: 101 mg/dL — ABNORMAL HIGH (ref 65–99)
POTASSIUM: 3.1 mmol/L — AB (ref 3.5–5.1)
Sodium: 142 mmol/L (ref 135–145)

## 2017-07-27 LAB — TROPONIN I: Troponin I: <0.03 ng/mL (ref <0.03)

## 2017-07-27 LAB — CBC
HEMATOCRIT: 33.3 % — AB (ref 35.0–47.0)
HEMOGLOBIN: 11.5 g/dL — AB (ref 12.0–16.0)
MCH: 30.2 pg (ref 26.0–34.0)
MCHC: 34.5 g/dL (ref 32.0–36.0)
MCV: 87.6 fL (ref 80.0–100.0)
Platelets: 384 10*3/uL (ref 150–440)
RBC: 3.81 MIL/uL (ref 3.80–5.20)
RDW: 16.9 % — ABNORMAL HIGH (ref 11.5–14.5)
WBC: 6.8 10*3/uL (ref 3.6–11.0)

## 2017-07-27 LAB — HEPATIC FUNCTION PANEL
ALT: 33 U/L (ref 14–54)
AST: 35 U/L (ref 15–41)
Albumin: 3.5 g/dL (ref 3.5–5.0)
Alkaline Phosphatase: 76 U/L (ref 38–126)
Bilirubin, Direct: <0.1 mg/dL — ABNORMAL LOW (ref 0.1–0.5)
TOTAL PROTEIN: 7 g/dL (ref 6.5–8.1)
Total Bilirubin: 0.6 mg/dL (ref 0.3–1.2)

## 2017-07-27 LAB — LIPASE, BLOOD: Lipase: 40 U/L (ref 11–51)

## 2017-07-27 NOTE — Discharge Instructions (Signed)
It was a pleasure to take care of you today, and thank you for coming to our emergency department.  If you have any questions or concerns before leaving please ask the nurse to grab me and I'm more than happy to go through your aftercare instructions again.  If you were prescribed any opioid pain medication today such as Norco, Vicodin, Percocet, morphine, hydrocodone, or oxycodone please make sure you do not drive when you are taking this medication as it can alter your ability to drive safely.  If you have any concerns once you are home that you are not improving or are in fact getting worse before you can make it to your follow-up appointment, please do not hesitate to call 911 and come back for further evaluation.  Darel Hong, MD  Results for orders placed or performed during the hospital encounter of 23/55/73  Basic metabolic panel  Result Value Ref Range   Sodium 142 135 - 145 mmol/L   Potassium 3.1 (L) 3.5 - 5.1 mmol/L   Chloride 111 101 - 111 mmol/L   CO2 23 22 - 32 mmol/L   Glucose, Bld 101 (H) 65 - 99 mg/dL   BUN 15 6 - 20 mg/dL   Creatinine, Ser 0.83 0.44 - 1.00 mg/dL   Calcium 9.2 8.9 - 10.3 mg/dL   GFR calc non Af Amer >60 >60 mL/min   GFR calc Af Amer >60 >60 mL/min   Anion gap 8 5 - 15  CBC  Result Value Ref Range   WBC 6.8 3.6 - 11.0 K/uL   RBC 3.81 3.80 - 5.20 MIL/uL   Hemoglobin 11.5 (L) 12.0 - 16.0 g/dL   HCT 33.3 (L) 35.0 - 47.0 %   MCV 87.6 80.0 - 100.0 fL   MCH 30.2 26.0 - 34.0 pg   MCHC 34.5 32.0 - 36.0 g/dL   RDW 16.9 (H) 11.5 - 14.5 %   Platelets 384 150 - 440 K/uL  Lipase, blood  Result Value Ref Range   Lipase 40 11 - 51 U/L  Hepatic function panel  Result Value Ref Range   Total Protein 7.0 6.5 - 8.1 g/dL   Albumin 3.5 3.5 - 5.0 g/dL   AST 35 15 - 41 U/L   ALT 33 14 - 54 U/L   Alkaline Phosphatase 76 38 - 126 U/L   Total Bilirubin 0.6 0.3 - 1.2 mg/dL   Bilirubin, Direct <0.1 (L) 0.1 - 0.5 mg/dL   Indirect Bilirubin NOT CALCULATED 0.3 - 0.9  mg/dL  Troponin I  Result Value Ref Range   Troponin I <0.03 <0.03 ng/mL

## 2017-07-27 NOTE — ED Notes (Signed)
Pt family member concerned with pt's high BP; pt with hx of same, reports taking her BP medications this morning as prescribed.  EDP made /aware, states he will be in to speak with pt and family.

## 2017-07-27 NOTE — ED Provider Notes (Signed)
[facility] [place] Emergency Department Provider Note  ____________________________________________   First MD Initiated Contact with Patient 07/27/17 [id number]     (approximate)  I have reviewed the triage vital signs and the nursing notes.   HISTORY  Chief Complaint Weakness   HPI Natalie Knox is a 67 y.o. female who self presents to the emergency department with generalized malaise that began this morning when she awoke.  She said she did not feel "quite right".  The symptoms were there when she awoke and lasted a short amount of time.  She comes to the emergency department today primarily for reassurance.  She has an appointment with her primary care physician tomorrow.  She denies palpitations chest pain shortness of breath abdominal pain nausea vomiting.  Her symptoms are currently resolved she would like to go home.  Past Medical History:  Diagnosis Date  . Allergic rhinitis   . Hyperlipidemia   . Hypertension     Patient Active Problem List   Diagnosis Date Noted  . Alcohol dependence with alcohol-induced psychotic disorder with complication ([place]) 12/14/3005  . Jugular venous distension 06/20/2017  . Essential hypertension 06/20/2017  . Tobacco dependence 06/20/2017  . Metabolic encephalopathy 62/26/3335  . Altered mental status     Past Surgical History:  Procedure Laterality Date  . UPPER GI ENDOSCOPY      Prior to Admission medications   Medication Sig Start Date End Date Taking? Authorizing Provider  amLODipine (NORVASC) 10 MG tablet Take 1 tablet (10 mg total) by mouth daily. 06/01/17   Ronnell Freshwater, NP  docusate sodium (COLACE) 100 MG capsule Take 1 capsule (100 mg total) by mouth 2 (two) times daily as needed for mild constipation. 05/30/17   Nicholes Mango, MD  folic acid (FOLVITE) 1 MG tablet Take 1 tablet (1 mg total) by mouth daily. 06/01/17 09/29/17  Ronnell Freshwater, NP  lisinopril (PRINIVIL,ZESTRIL) 20 MG tablet Take 1 tablet (20 mg  total) by mouth daily. 07/28/17   Ronnell Freshwater, NP  Multiple Vitamins-Minerals (MULTIVITAMIN WITH MINERALS) tablet Take 1 tablet by mouth daily. 05/30/17 05/30/18  Nicholes Mango, MD  naproxen (NAPROSYN) 500 MG tablet Take 500 mg by mouth 2 (two) times daily as needed.    [provider]  nicotine (NICODERM CQ) 14 mg/24hr patch Apply one patch to skin QD prn smoking cessation. 06/01/17   Ronnell Freshwater, NP  polyethylene glycol powder (GLYCOLAX/MIRALAX) powder Take 17 g by mouth daily. 07/28/17   Ronnell Freshwater, NP  pravastatin (PRAVACHOL) 40 MG tablet Take 40 mg by mouth at bedtime.  05/22/17   [provider]  thiamine (VITAMIN B-1) 100 MG tablet Take 1 tablet (100 mg total) by mouth daily. 06/01/17   Ronnell Freshwater, NP    Allergies Patient has no known allergies.  No family history on file.  Social History Social History   Tobacco Use  . Smoking status: Current Every Day Smoker    Packs/day: 0.50    Types: Cigarettes  . Smokeless tobacco: Never Used  Substance Use Topics  . Alcohol use: Yes    Frequency: Never    Comment: two days a week  . Drug use: No    Review of Systems Constitutional: No fever/chills Eyes: No visual changes. ENT: No sore throat. Cardiovascular: Denies chest pain. Respiratory: Denies shortness of breath. Gastrointestinal: No abdominal pain.  No nausea, no vomiting.  No diarrhea.  No constipation. Genitourinary: Negative for dysuria. Musculoskeletal: Negative for back pain.  Skin: Negative for rash. Neurological: Negative for headaches, focal weakness or numbness.   ____________________________________________   PHYSICAL EXAM:  VITAL SIGNS: ED Triage Vitals  Enc Vitals Group     BP 07/27/17 0823 (!) 192/70     Pulse Rate 07/27/17 0823 76     Resp --      Temp 07/27/17 0823 98 F (36.7 C)     Temp Source 07/27/17 0823 Oral     SpO2 07/27/17 0823 100 %     Weight 07/27/17 0805 166 lb (75.3 kg)     Height 07/27/17 0805 5'  5" (1.651 m)     Head Circumference --      Peak Flow --      Pain Score 07/27/17 0805 0     Pain Loc --      Pain Edu? --      Excl. in [place]? --     Constitutional: Alert and oriented x4 mildly anxious appearing nontoxic no diaphoresis speaks full clear sentences Eyes: PERRL EOMI. midrange and brisk Head: Atraumatic. Nose: No congestion/rhinnorhea. Mouth/Throat: No trismus Neck: No stridor.   Cardiovascular: Normal rate, regular rhythm. Grossly normal heart sounds.  Good peripheral circulation. Respiratory: Normal respiratory effort.  No retractions. Lungs CTAB and moving good air Gastrointestinal: Soft nontender Musculoskeletal: No lower extremity edema   Neurologic:  Normal speech and language. No gross focal neurologic deficits are appreciated. Skin:  Skin is warm, dry and intact. No rash noted. Psychiatric: Mood and affect are normal. Speech and behavior are normal.    ____________________________________________   DIFFERENTIAL includes but not limited to  Dehydration, ectopy, thyrotoxicosis, sepsis ____________________________________________   LABS (all labs ordered are listed, but only abnormal results are displayed)  Labs Reviewed  BASIC METABOLIC PANEL - Abnormal; Notable for the following components:      Result Value   Potassium 3.1 (*)    Glucose, Bld 101 (*)    All other components within normal limits  CBC - Abnormal; Notable for the following components:   Hemoglobin 11.5 (*)    HCT 33.3 (*)    RDW 16.9 (*)    All other components within normal limits  HEPATIC FUNCTION PANEL - Abnormal; Notable for the following components:   Bilirubin, Direct <0.1 (*)    All other components within normal limits  LIPASE, BLOOD  TROPONIN I    Lab work reviewed by me with no acute disease __________________________________________  EKG  ED ECG REPORT I, Darel Hong, the attending physician, personally viewed and interpreted this ECG.  Date: 07/27/2017 EKG  Time:  Rate: 88 Rhythm: normal sinus rhythm QRS Axis: normal Intervals: normal ST/T Wave abnormalities: High lateral T wave inversion consistent with old Narrative Interpretation: no evidence of acute ischemia  ____________________________________________  RADIOLOGY   ____________________________________________   PROCEDURES  Procedure(s) performed: no  Procedures  Critical Care performed: no  Observation: no ____________________________________________   INITIAL IMPRESSION / ASSESSMENT AND PLAN / ED COURSE  Pertinent labs & imaging results that were available during my care of the patient were reviewed by me and considered in my medical decision making (see chart for details).  On the time I saw the patient her symptoms had resolved she was very well-appearing.  Lab work with no signs of endorgan damage.  EKG is nonischemic.  I had a lengthy discussion with patient and her daughter at bedside regarding the diagnostic uncertainty with the importance of maintaining follow-up tomorrow as scheduled.  The patient verbalizes understanding agreement with  the plan with strict return precautions given.      ____________________________________________   FINAL CLINICAL IMPRESSION(S) / ED DIAGNOSES  Final diagnoses:  Weakness      NEW MEDICATIONS STARTED DURING THIS VISIT:  Discharge Medication List as of 07/27/2017  9:50 AM       Note:  This document was prepared using Dragon voice recognition software and may include unintentional dictation errors.     Darel Hong, MD 07/29/17 0110

## 2017-07-27 NOTE — ED Triage Notes (Signed)
Pt reports got up this am and felt weak and tired and she didn't have much of an appetite. Pt denies pain and other symptoms.

## 2017-07-28 ENCOUNTER — Encounter: Payer: Self-pay | Admitting: Nurse Practitioner

## 2017-07-28 ENCOUNTER — Ambulatory Visit (INDEPENDENT_AMBULATORY_CARE_PROVIDER_SITE_OTHER): Payer: 59 | Admitting: Nurse Practitioner

## 2017-07-28 VITALS — BP 148/60 | HR 76 | Resp 16 | Ht 66.0 in | Wt 169.0 lb

## 2017-07-28 DIAGNOSIS — R4182 Altered mental status, unspecified: Secondary | ICD-10-CM | POA: Diagnosis not present

## 2017-07-28 DIAGNOSIS — K5900 Constipation, unspecified: Secondary | ICD-10-CM | POA: Diagnosis not present

## 2017-07-28 DIAGNOSIS — A523 Neurosyphilis, unspecified: Secondary | ICD-10-CM

## 2017-07-28 DIAGNOSIS — I10 Essential (primary) hypertension: Secondary | ICD-10-CM

## 2017-07-28 MED ORDER — POLYETHYLENE GLYCOL 3350 17 GM/SCOOP PO POWD: 17.0000 g | Freq: Every day | ORAL | 1 refills | Status: DC

## 2017-07-28 MED ORDER — LISINOPRIL 20 MG PO TABS
20.0000 mg | ORAL_TABLET | Freq: Every day | ORAL | 1 refills | Status: DC
Start: 2017-07-28 — End: 2017-12-11

## 2017-07-28 MED ORDER — LISINOPRIL 20 MG PO TABS: 20.0000 mg | ORAL_TABLET | Freq: Every day | ORAL | 2 refills | Status: DC

## 2017-07-28 NOTE — Progress Notes (Signed)
[facility] [address] [place], [place] [address]  Internal MEDICINE  Office Visit Note  Patient Name: Natalie Knox  [id number]  [id number]  Date of Service: 08/19/2017   Pt is here for routine follow up.   Chief Complaint  Patient presents with  . Hypertension    pt went to ED her bp was  192/70  . Constipation    Hypertension  This is a chronic problem. The current episode started more than 1 month ago. The problem has been gradually improving since onset. The problem is resistant. Associated symptoms include headaches and orthopnea. Pertinent negatives include no chest pain, neck pain, palpitations or shortness of breath. There are no associated agents to hypertension. Risk factors for coronary artery disease include post-menopausal state, sedentary lifestyle and obesity. Past treatments include ACE inhibitors and calcium channel blockers. The current treatment provides mild improvement. Compliance problems include psychosocial issues.   Constipation  This is a recurrent problem. The current episode started more than 1 year ago. The problem is unchanged. Her stool frequency is 2 to 3 times per week. The stool is described as formed. The patient is not on a high fiber diet. She does not exercise regularly. There has not been adequate water intake. Associated symptoms include bloating, flatus, hemorrhoids and nausea. Pertinent negatives include no abdominal pain, back pain, diarrhea or vomiting. Risk factors include change in medication usage/dosage, recent dehydration, recent illness and stress. She has tried diet changes, laxatives and stool softeners for the symptoms. The treatment provided mild relief. Her past medical history is significant for psychiatric history.       Current Medication: Outpatient Encounter Medications as of 07/28/2017  Medication Sig  . amLODipine (NORVASC) 10 MG tablet Take 1 tablet (10 mg total) by mouth daily.  [name] docusate sodium (COLACE)  100 MG capsule Take 1 capsule (100 mg total) by mouth 2 (two) times daily as needed for mild constipation.  . folic acid (FOLVITE) 1 MG tablet Take 1 tablet (1 mg total) by mouth daily.  [name] lisinopril (PRINIVIL,ZESTRIL) 20 MG tablet Take 1 tablet (20 mg total) by mouth daily.  . Multiple Vitamins-Minerals (MULTIVITAMIN WITH MINERALS) tablet Take 1 tablet by mouth daily.  . naproxen (NAPROSYN) 500 MG tablet Take 500 mg by mouth 2 (two) times daily as needed.  . nicotine (NICODERM CQ) 14 mg/24hr patch Apply one patch to skin QD prn smoking cessation.  . polyethylene glycol powder (GLYCOLAX/MIRALAX) powder Take 17 g by mouth daily.  . pravastatin (PRAVACHOL) 40 MG tablet Take 40 mg by mouth at bedtime.   . thiamine (VITAMIN B-1) 100 MG tablet Take 1 tablet (100 mg total) by mouth daily.  . [DISCONTINUED] lisinopril (PRINIVIL,ZESTRIL) 10 MG tablet Take 1 tablet (10 mg total) by mouth daily.  . [DISCONTINUED] lisinopril (PRINIVIL,ZESTRIL) 20 MG tablet Take 1 tablet (20 mg total) by mouth daily.   No facility-administered encounter medications on file as of 07/28/2017.     Surgical History: Past Surgical History:  Procedure Laterality Date  . UPPER GI ENDOSCOPY      Medical History: Past Medical History:  Diagnosis Date  . Allergic rhinitis   . Hyperlipidemia   . Hypertension     Family History: Family History  Problem Relation Age of Onset  . Breast cancer Sister     Social History   Socioeconomic History  . Marital status: Single    Spouse name: Not on file  . Number of children: Not on file  .  Years of education: Not on file  . Highest education level: Not on file  Occupational History  . Not on file  Social Needs  . Financial resource strain: Not on file  . Food insecurity:    Worry: Not on file    Inability: Not on file  . Transportation needs:    Medical: Not on file    Non-medical: Not on file  Tobacco Use  . Smoking status: Current Every Day Smoker    Packs/day:  0.50    Types: Cigarettes  . Smokeless tobacco: Never Used  Substance and Sexual Activity  . Alcohol use: Yes    Frequency: Never    Comment: two days a week  . Drug use: No  . Sexual activity: Not on file  Lifestyle  . Physical activity:    Days per week: Not on file    Minutes per session: Not on file  . Stress: Not on file  Relationships  . Social connections:    Talks on phone: Not on file    Gets together: Not on file    Attends religious service: Not on file    Active member of club or organization: Not on file    Attends meetings of clubs or organizations: Not on file    Relationship status: Not on file  . Intimate partner violence:    Fear of current or ex partner: Not on file    Emotionally abused: Not on file    Physically abused: Not on file    Forced sexual activity: Not on file  Other Topics Concern  . Not on file  Social History Narrative  . Not on file      Review of Systems  Constitutional: Positive for activity change, appetite change and fatigue. Negative for chills and unexpected weight change.  HENT: Negative for congestion, postnasal drip, rhinorrhea, sneezing and sore throat.   Eyes: Negative.  Negative for redness.  Respiratory: Negative for cough, chest tightness, shortness of breath and wheezing.   Cardiovascular: Positive for orthopnea. Negative for chest pain and palpitations.       Blood pressure significantly elevated recently. Improved today.   Gastrointestinal: Positive for bloating, constipation, flatus, hemorrhoids and nausea. Negative for abdominal pain, diarrhea and vomiting.  Endocrine: Negative for cold intolerance, heat intolerance, polydipsia, polyphagia and polyuria.  Genitourinary: Negative.  Negative for dysuria and frequency.  Musculoskeletal: Negative for arthralgias, back pain, joint swelling and neck pain.  Skin: Negative for rash.  Allergic/Immunologic: Negative for environmental allergies.  Neurological: Positive for  dizziness, tremors, weakness, light-headedness and headaches. Negative for numbness.  Hematological: Negative for adenopathy. Does not bruise/bleed easily.  Psychiatric/Behavioral: Positive for behavioral problems (Depression), confusion, dysphoric mood and hallucinations. Negative for sleep disturbance and suicidal ideas. The patient is nervous/anxious.     Today's Vitals   07/28/17 1042 07/28/17 1120  BP: (!) 160/80 (!) 148/60  Pulse: 76   Resp: 16   SpO2: 96%   Weight: 169 lb (76.7 kg)   Height: 5\' 6"  (1.676 m)     Physical Exam  Constitutional: She is oriented to person, place, and time. She appears well-developed and well-nourished. No distress.  HENT:  Head: Normocephalic and atraumatic.  Mouth/Throat: Oropharynx is clear and moist. No oropharyngeal exudate.  Eyes: Pupils are equal, round, and reactive to light. EOM are normal.  Neck: Normal range of motion. Neck supple. No JVD present. Carotid bruit is not present. No tracheal deviation present. No thyromegaly present.  Cardiovascular: Normal rate and  regular rhythm. Exam reveals no gallop and no friction rub.  Murmur heard. Pulmonary/Chest: Effort normal and breath sounds normal. No respiratory distress. She has no wheezes. She has no rales. She exhibits no tenderness.  Abdominal: Soft. Bowel sounds are normal. There is tenderness.  Mild, generalized abdominal tenderness .  Musculoskeletal: Normal range of motion.  Lymphadenopathy:    She has no cervical adenopathy.  Neurological: She is alert and oriented to person, place, and time. No cranial nerve deficit.  She is at neurological baseline since leaving geriatric psychiaric facility.   Skin: Skin is warm and dry. Capillary refill takes less than 2 seconds. She is not diaphoretic.  Psychiatric: Her behavior is normal. Judgment and thought content normal. Her mood appears anxious. Her speech is delayed. Cognition and memory are impaired. She exhibits a depressed mood. She  expresses no homicidal and no suicidal ideation.  Nursing note and vitals reviewed.  Assessment/Plan:  1. Essential hypertension Improved after recent ER visit. Continue lisinopril 20mg  daily. Continue amlodipine 10mg  daily.  - lisinopril (PRINIVIL,ZESTRIL) 20 MG tablet; Take 1 tablet (20 mg total) by mouth daily.  Dispense: 90 tablet; Refill: 1  2. Constipation, unspecified constipation type Recommend use of miralax 17grams every evening. Increase intake of water and fiber in diet. May continue to use stoll softener as needed.  - polyethylene glycol powder (GLYCOLAX/MIRALAX) powder; Take 17 g by mouth daily.  Dispense: 3350 g; Refill: 1  3. Altered mental status, unspecified altered mental status type continue to see neurology as scheduled.   4. Neurosyphilis in adult New diagnosis. Treated per infectious disease and neurology.    General Counseling: Fancy verbalizes understanding of the findings of todays visit and agrees with plan of treatment. I have discussed any further diagnostic evaluation that may be needed or ordered today. We also reviewed her medications today. she has been encouraged to call the office with any questions or concerns that should arise related to todays visit.  Hypertension Counseling:   The following hypertensive lifestyle modification were recommended and discussed:  1. Limiting alcohol intake to less than 1 oz/day of ethanol:(24 oz of beer or 8 oz of wine or 2 oz of 100-proof whiskey). 2. Take baby ASA 81 mg daily. 3. Importance of regular aerobic exercise and losing weight. 4. Reduce dietary saturated fat and cholesterol intake for overall cardiovascular health. 5. Maintaining adequate dietary potassium, calcium, and magnesium intake. 6. Regular monitoring of the blood pressure. 7. Reduce sodium intake to less than 100 mmol/day (less than 2.3 gm of sodium or less than 6 gm of sodium choride)   This patient was seen by Leretha Pol, FNP- C in  Collaboration with Dr Lavera Guise as a part of collaborative care agreement  Meds ordered this encounter  Medications  . DISCONTD: lisinopril (PRINIVIL,ZESTRIL) 20 MG tablet    Sig: Take 1 tablet (20 mg total) by mouth daily.    Dispense:  30 tablet    Refill:  2    Please note increased dose    Order Specific Question:   Supervising Provider    Answer:   Lavera Guise [[id number]]  . lisinopril (PRINIVIL,ZESTRIL) 20 MG tablet    Sig: Take 1 tablet (20 mg total) by mouth daily.    Dispense:  90 tablet    Refill:  1    Please note increased dose    Order Specific Question:   Supervising Provider    Answer:   Lavera Guise [[id number]]  . polyethylene  glycol powder (GLYCOLAX/MIRALAX) powder    Sig: Take 17 g by mouth daily.    Dispense:  3350 g    Refill:  1    Order Specific Question:   Supervising Provider    Answer:   Lavera Guise [[id number]]    Time spent: 73 Minutes        Dr Lavera Guise Internal medicine

## 2017-08-03 DIAGNOSIS — R410 Disorientation, unspecified: Secondary | ICD-10-CM | POA: Diagnosis not present

## 2017-08-03 DIAGNOSIS — Z8661 Personal history of infections of the central nervous system: Secondary | ICD-10-CM | POA: Diagnosis not present

## 2017-08-14 ENCOUNTER — Ambulatory Visit
Admission: RE | Admit: 2017-08-14 | Discharge: 2017-08-14 | Disposition: A | Discharge status: Home / Self Care | Payer: Medicare HMO | Source: Ambulatory Visit | Attending: Nurse Practitioner | Admitting: Nurse Practitioner

## 2017-08-14 DIAGNOSIS — Z1231 Encounter for screening mammogram for malignant neoplasm of breast: Secondary | ICD-10-CM

## 2017-08-19 DIAGNOSIS — A523 Neurosyphilis, unspecified: Secondary | ICD-10-CM

## 2017-08-19 DIAGNOSIS — A539 Syphilis, unspecified: Secondary | ICD-10-CM | POA: Insufficient documentation

## 2017-08-19 DIAGNOSIS — K5900 Constipation, unspecified: Secondary | ICD-10-CM | POA: Insufficient documentation

## 2017-08-31 ENCOUNTER — Encounter: Payer: Self-pay | Admitting: Nurse Practitioner

## 2017-08-31 ENCOUNTER — Ambulatory Visit (INDEPENDENT_AMBULATORY_CARE_PROVIDER_SITE_OTHER): Payer: 59 | Admitting: Nurse Practitioner

## 2017-08-31 VITALS — BP 132/62 | HR 83 | Resp 16 | Ht 66.0 in | Wt 163.0 lb

## 2017-08-31 DIAGNOSIS — I10 Essential (primary) hypertension: Secondary | ICD-10-CM

## 2017-08-31 DIAGNOSIS — L03317 Cellulitis of buttock: Secondary | ICD-10-CM

## 2017-08-31 DIAGNOSIS — L0231 Cutaneous abscess of buttock: Secondary | ICD-10-CM

## 2017-08-31 MED ORDER — LIDOCAINE VISCOUS HCL 2 % MT SOLN: OROMUCOSAL | 1 refills | Status: DC

## 2017-08-31 MED ORDER — SULFAMETHOXAZOLE-TRIMETHOPRIM 800-160 MG PO TABS: 1.0000 | ORAL_TABLET | Freq: Two times a day (BID) | ORAL | 0 refills | Status: DC

## 2017-08-31 NOTE — Progress Notes (Signed)
[facility], [place] [address]  Internal MEDICINE  Office Visit Note  Patient Name: Natalie Knox  [id number]  [id number]  Date of Service: 09/19/2017   Pt is here for routine follow up.  Chief Complaint  Patient presents with  . Recurrent Skin Infections    on right buttock.   . Hypertension    The patient noted sore area on right buttock on Wednesday. Felt a swollen bump. Started to drain. Is still painful, off and on. Hurts to sit or to bathe.  Also has resistant hypertension. Improved today, though still elevated.        Current Medication: Outpatient Encounter Medications as of 08/31/2017  Medication Sig  . amLODipine (NORVASC) 10 MG tablet Take 1 tablet (10 mg total) by mouth daily.  [name] docusate sodium (COLACE) 100 MG capsule Take 1 capsule (100 mg total) by mouth 2 (two) times daily as needed for mild constipation.  . lidocaine (XYLOCAINE) 2 % solution Apply small amount to affected area QID prn pain  . lisinopril (PRINIVIL,ZESTRIL) 20 MG tablet Take 1 tablet (20 mg total) by mouth daily.  . Multiple Vitamins-Minerals (MULTIVITAMIN WITH MINERALS) tablet Take 1 tablet by mouth daily.  . naproxen (NAPROSYN) 500 MG tablet Take 500 mg by mouth 2 (two) times daily as needed.  . nicotine (NICODERM CQ) 14 mg/24hr patch Apply one patch to skin QD prn smoking cessation.  . polyethylene glycol powder (GLYCOLAX/MIRALAX) powder Take 17 g by mouth daily.  . pravastatin (PRAVACHOL) 40 MG tablet Take 40 mg by mouth at bedtime.   . sulfamethoxazole-trimethoprim (BACTRIM DS,SEPTRA DS) 800-160 MG tablet Take 1 tablet by mouth 2 (two) times daily.  . [DISCONTINUED] folic acid (FOLVITE) 1 MG tablet Take 1 tablet (1 mg total) by mouth daily.  . [DISCONTINUED] thiamine (VITAMIN B-1) 100 MG tablet Take 1 tablet (100 mg total) by mouth daily.   No facility-administered encounter medications on file as of 08/31/2017.     Surgical History: Past Surgical  History:  Procedure Laterality Date  . UPPER GI ENDOSCOPY      Medical History: Past Medical History:  Diagnosis Date  . Allergic rhinitis   . Hyperlipidemia   . Hypertension     Family History: Family History  Problem Relation Age of Onset  . Breast cancer Sister     Social History   Socioeconomic History  . Marital status: Single    Spouse name: Not on file  . Number of children: Not on file  . Years of education: Not on file  . Highest education level: Not on file  Occupational History  . Not on file  Social Needs  . Financial resource strain: Not on file  . Food insecurity:    Worry: Not on file    Inability: Not on file  . Transportation needs:    Medical: Not on file    Non-medical: Not on file  Tobacco Use  . Smoking status: Current Every Day Smoker    Packs/day: 0.50    Types: Cigarettes  . Smokeless tobacco: Never Used  Substance and Sexual Activity  . Alcohol use: Yes    Frequency: Never    Comment: two days a week  . Drug use: No  . Sexual activity: Not on file  Lifestyle  . Physical activity:    Days per week: Not on file    Minutes per session: Not on file  . Stress: Not on file  Relationships  . Social  connections:    Talks on phone: Not on file    Gets together: Not on file    Attends religious service: Not on file    Active member of club or organization: Not on file    Attends meetings of clubs or organizations: Not on file    Relationship status: Not on file  . Intimate partner violence:    Fear of current or ex partner: Not on file    Emotionally abused: Not on file    Physically abused: Not on file    Forced sexual activity: Not on file  Other Topics Concern  . Not on file  Social History Narrative  . Not on file      Review of Systems  Constitutional: Negative for activity change, appetite change, chills, fatigue and unexpected weight change.  HENT: Negative for congestion, postnasal drip, rhinorrhea, sneezing and sore  throat.   Eyes: Negative.  Negative for redness.  Respiratory: Negative for cough, chest tightness, shortness of breath and wheezing.   Cardiovascular: Negative for chest pain and palpitations.       Blood pressure significantly elevated recently. Improved today.   Gastrointestinal: Positive for constipation and nausea. Negative for abdominal pain, diarrhea and vomiting.  Endocrine: Negative for cold intolerance, heat intolerance, polydipsia, polyphagia and polyuria.  Genitourinary: Negative.  Negative for dysuria and frequency.  Musculoskeletal: Negative for arthralgias, back pain, joint swelling and neck pain.  Skin: Negative for rash.       Boil on right buttock.   Allergic/Immunologic: Negative for environmental allergies.  Neurological: Positive for dizziness, tremors, weakness, light-headedness and headaches. Negative for numbness.  Hematological: Negative for adenopathy. Does not bruise/bleed easily.  Psychiatric/Behavioral: Positive for behavioral problems (Depression), confusion, dysphoric mood and hallucinations. Negative for sleep disturbance and suicidal ideas. The patient is nervous/anxious.        Very improved since her last visit .    Today's Vitals   08/31/17 1006  BP: 132/62  Pulse: 83  Resp: 16  SpO2: 97%  Weight: 163 lb (73.9 kg)  Height: 5\' 6"  (1.676 m)    Physical Exam  Constitutional: She is oriented to person, place, and time. She appears well-developed and well-nourished. No distress.  HENT:  Head: Normocephalic and atraumatic.  Nose: Nose normal.  Mouth/Throat: Oropharynx is clear and moist. No oropharyngeal exudate.  Eyes: Pupils are equal, round, and reactive to light. Conjunctivae and EOM are normal.  Neck: Normal range of motion. Neck supple. No JVD present. Carotid bruit is not present. No tracheal deviation present. No thyromegaly present.  Cardiovascular: Normal rate and regular rhythm. Exam reveals no gallop and no friction rub.  Murmur  heard. Pulmonary/Chest: Effort normal and breath sounds normal. No respiratory distress. She has no wheezes. She has no rales. She exhibits no tenderness.  Abdominal: Soft. Bowel sounds are normal. There is no tenderness.  Musculoskeletal: Normal range of motion.  Lymphadenopathy:    She has no cervical adenopathy.  Neurological: She is alert and oriented to person, place, and time. No cranial nerve deficit.  She is at neurological baseline since leaving [facility]iatric psych[facility] facility.   Skin: Skin is warm and dry. Capillary refill takes less than 2 seconds. She is not diaphoretic.  There is abscess located just to the right of the rectum. It is red, warm, and very tender. There is some white colored drainage noted from the abscess.   Psychiatric: Her behavior is normal. Judgment and thought content normal. Her mood appears anxious. Her speech  is delayed. Cognition and memory are impaired. She exhibits a depressed mood. She expresses no homicidal and no suicidal ideation.  Nursing note and vitals reviewed.  Assessment/Plan: 1. Cellulitis and abscess of buttock Start bactrim DS bid for 10 days. Viscous lidocaine may be applied to the affected area four times daily as needed for pain - sulfamethoxazole-trimethoprim (BACTRIM DS,SEPTRA DS) 800-160 MG tablet; Take 1 tablet by mouth 2 (two) times daily.  Dispense: 20 tablet; Refill: 0 - lidocaine (XYLOCAINE) 2 % solution; Apply small amount to affected area QID prn pain  Dispense: 15 mL; Refill: 1  2. Essential hypertension Improving. Elevated due to pain. No medication changes. Will monitor closely.   General Counseling: Natalie Knox verbalizes understanding of the findings of todays visit and agrees with plan of treatment. I have discussed any further diagnostic evaluation that may be needed or ordered today. We also reviewed her medications today. she has been encouraged to call the office with any questions or concerns that should arise related to todays  visit.    Counseling:  This patient was seen by Leretha Pol, FNP- C in Collaboration with Dr Lavera Guise as a part of collaborative care agreement   Meds ordered this encounter  Medications  . sulfamethoxazole-trimethoprim (BACTRIM DS,SEPTRA DS) 800-160 MG tablet    Sig: Take 1 tablet by mouth 2 (two) times daily.    Dispense:  20 tablet    Refill:  0    Order Specific Question:   Supervising Provider    Answer:   Lavera Guise [[id number]]  . lidocaine (XYLOCAINE) 2 % solution    Sig: Apply small amount to affected area QID prn pain    Dispense:  15 mL    Refill:  1    Order Specific Question:   Supervising Provider    Answer:   Lavera Guise [[id number]]    Time spent: 84 Minutes          Dr Lavera Guise Internal medicine

## 2017-09-08 ENCOUNTER — Other Ambulatory Visit: Payer: Self-pay

## 2017-09-08 DIAGNOSIS — F10259 Alcohol dependence with alcohol-induced psychotic disorder, unspecified: Secondary | ICD-10-CM

## 2017-09-08 MED ORDER — FOLIC ACID 1 MG PO TABS: 1.0000 mg | ORAL_TABLET | Freq: Every day | ORAL | 3 refills | Status: DC

## 2017-09-14 ENCOUNTER — Other Ambulatory Visit: Payer: Self-pay

## 2017-09-14 DIAGNOSIS — F10259 Alcohol dependence with alcohol-induced psychotic disorder, unspecified: Secondary | ICD-10-CM

## 2017-09-14 MED ORDER — VITAMIN B-1 100 MG PO TABS: 100.0000 mg | ORAL_TABLET | Freq: Every day | ORAL | 3 refills | Status: DC

## 2017-09-17 ENCOUNTER — Other Ambulatory Visit: Payer: Self-pay | Admitting: Nurse Practitioner

## 2017-09-17 ENCOUNTER — Encounter: Payer: Self-pay | Admitting: Nurse Practitioner

## 2017-09-17 ENCOUNTER — Ambulatory Visit (INDEPENDENT_AMBULATORY_CARE_PROVIDER_SITE_OTHER): Payer: 59 | Admitting: Nurse Practitioner

## 2017-09-17 VITALS — BP 154/55 | HR 69 | Resp 16 | Ht 66.0 in | Wt 162.0 lb

## 2017-09-17 DIAGNOSIS — A523 Neurosyphilis, unspecified: Secondary | ICD-10-CM | POA: Diagnosis not present

## 2017-09-17 DIAGNOSIS — L03317 Cellulitis of buttock: Secondary | ICD-10-CM | POA: Diagnosis not present

## 2017-09-17 DIAGNOSIS — L0231 Cutaneous abscess of buttock: Secondary | ICD-10-CM

## 2017-09-17 DIAGNOSIS — I10 Essential (primary) hypertension: Secondary | ICD-10-CM | POA: Diagnosis not present

## 2017-09-17 DIAGNOSIS — F10259 Alcohol dependence with alcohol-induced psychotic disorder, unspecified: Secondary | ICD-10-CM

## 2017-09-17 MED ORDER — FOLIC ACID 1 MG PO TABS: 1.0000 mg | ORAL_TABLET | Freq: Every day | ORAL | 3 refills | Status: DC

## 2017-09-17 NOTE — Progress Notes (Signed)
[facility] [place],  [address]  Internal MEDICINE  Office Visit Note  Patient Name: Natalie Knox  [id number]  [id number]  Date of Service: 09/17/2017    Pt is here for routine follow up.   Chief Complaint  Patient presents with  . Recurrent Skin Infections    The patient noted sore area on right buttock about 2 weeks ago  Felt a swollen bump. Started to drain. Is still painful, off and on. Was seen for this and prescribed bactrim DS bid. She states that she has not started this yet. Saw that this could cause diarrhea and she was afraid to start it. The initial lesion has completely drainedd and scabbed over. She has noted several other small ones around the buttock and lower back which are itchy. They go away on their own very quickly.       Current Medication: Outpatient Encounter Medications as of 09/17/2017  Medication Sig  . amLODipine (NORVASC) 10 MG tablet Take 1 tablet (10 mg total) by mouth daily.  [name] docusate sodium (COLACE) 100 MG capsule Take 1 capsule (100 mg total) by mouth 2 (two) times daily as needed for mild constipation.  . lidocaine (XYLOCAINE) 2 % solution Apply small amount to affected area QID prn pain  . lisinopril (PRINIVIL,ZESTRIL) 20 MG tablet Take 1 tablet (20 mg total) by mouth daily.  . Multiple Vitamins-Minerals (MULTIVITAMIN WITH MINERALS) tablet Take 1 tablet by mouth daily.  . naproxen (NAPROSYN) 500 MG tablet Take 500 mg by mouth 2 (two) times daily as needed.  . nicotine (NICODERM CQ) 14 mg/24hr patch Apply one patch to skin QD prn smoking cessation.  . polyethylene glycol powder (GLYCOLAX/MIRALAX) powder Take 17 g by mouth daily.  . pravastatin (PRAVACHOL) 40 MG tablet Take 40 mg by mouth at bedtime.   . sulfamethoxazole-trimethoprim (BACTRIM DS,SEPTRA DS) 800-160 MG tablet Take 1 tablet by mouth 2 (two) times daily.  [name] thiamine (VITAMIN B-1) 100 MG tablet Take 1 tablet (100 mg total) by mouth daily.  .  [DISCONTINUED] folic acid (FOLVITE) 1 MG tablet Take 1 tablet (1 mg total) by mouth daily.   No facility-administered encounter medications on file as of 09/17/2017.     Surgical History: Past Surgical History:  Procedure Laterality Date  . UPPER GI ENDOSCOPY      Medical History: Past Medical History:  Diagnosis Date  . Allergic rhinitis   . Hyperlipidemia   . Hypertension     Family History: Family History  Problem Relation Age of Onset  . Breast cancer Sister     Social History   Socioeconomic History  . Marital status: Single    Spouse name: Not on file  . Number of children: Not on file  . Years of education: Not on file  . Highest education level: Not on file  Occupational History  . Not on file  Social Needs  . Financial resource strain: Not on file  . Food insecurity:    Worry: Not on file    Inability: Not on file  . Transportation needs:    Medical: Not on file    Non-medical: Not on file  Tobacco Use  . Smoking status: Current Every Day Smoker    Packs/day: 0.50    Types: Cigarettes  . Smokeless tobacco: Never Used  Substance and Sexual Activity  . Alcohol use: Yes    Frequency: Never    Comment: two days a week  . Drug use: No  .  Sexual activity: Not on file  Lifestyle  . Physical activity:    Days per week: Not on file    Minutes per session: Not on file  . Stress: Not on file  Relationships  . Social connections:    Talks on phone: Not on file    Gets together: Not on file    Attends religious service: Not on file    Active member of club or organization: Not on file    Attends meetings of clubs or organizations: Not on file    Relationship status: Not on file  . Intimate partner violence:    Fear of current or ex partner: Not on file    Emotionally abused: Not on file    Physically abused: Not on file    Forced sexual activity: Not on file  Other Topics Concern  . Not on file  Social History Narrative  . Not on file       Review of Systems  Constitutional: Negative for activity change, appetite change, chills, fatigue and unexpected weight change.  HENT: Negative for congestion, postnasal drip, rhinorrhea, sneezing and sore throat.   Eyes: Negative.  Negative for redness.  Respiratory: Negative for cough, chest tightness, shortness of breath and wheezing.   Cardiovascular: Negative for chest pain and palpitations.       Blood pressure significantly elevated recently. Improved today.   Gastrointestinal: Positive for constipation and nausea. Negative for abdominal pain, diarrhea and vomiting.  Endocrine: Negative for cold intolerance, heat intolerance, polydipsia, polyphagia and polyuria.  Genitourinary: Negative for dysuria, frequency and vaginal pain.  Musculoskeletal: Negative for arthralgias, back pain, joint swelling and neck pain.  Skin: Positive for rash.       Intermittent rash on lower back and buttocks which is very itchy. Abscess, previously present on right buttock has resolved.   Allergic/Immunologic: Negative for environmental allergies.  Neurological: Negative for dizziness, tremors, weakness, light-headedness, numbness and headaches.  Hematological: Negative for adenopathy. Does not bruise/bleed easily.  Psychiatric/Behavioral: Positive for behavioral problems (Depression), confusion, dysphoric mood and hallucinations. Negative for sleep disturbance and suicidal ideas. The patient is nervous/anxious.        Very improved since her last visit .   Today's Vitals   09/17/17 1119  BP: (!) 154/55  Pulse: 69  Resp: 16  SpO2: 98%  Weight: 162 lb (73.5 kg)  Height: 5\' 6"  (1.676 m)    Physical Exam  Constitutional: She is oriented to person, place, and time. She appears well-developed and well-nourished. No distress.  HENT:  Head: Normocephalic and atraumatic.  Mouth/Throat: Oropharynx is clear and moist. No oropharyngeal exudate.  Eyes: Pupils are equal, round, and reactive to light.  EOM are normal.  Neck: Normal range of motion. Neck supple. No JVD present. No tracheal deviation present. No thyromegaly present.  Cardiovascular: Normal rate, regular rhythm and normal heart sounds. Exam reveals no gallop and no friction rub.  No murmur heard. Pulmonary/Chest: Effort normal and breath sounds normal. No respiratory distress. She has no wheezes. She has no rales. She exhibits no tenderness.  Abdominal: Soft. Bowel sounds are normal. There is no tenderness.  Musculoskeletal: Normal range of motion.  Lymphadenopathy:    She has no cervical adenopathy.  Neurological: She is alert and oriented to person, place, and time. No cranial nerve deficit.  Skin: Skin is warm and dry. She is not diaphoretic.  Psychiatric: Her speech is normal and behavior is normal. Judgment and thought content normal. Her mood appears anxious.  Nursing note and vitals reviewed.  Assessment/Plan: 1. Cellulitis and abscess of buttock Recommend she start taking bactrim DS bid until prscription is gone.   2. Neurosyphilis in adult Was established with Dr. Ola Spurr. Will refer to new provider, taking his place, for continued observation.  - Ambulatory referral to Infectious Disease  3. Essential hypertension Continues to improve. No changes in BP medication today.   General Counseling: Lavonna verbalizes understanding of the findings of todays visit and agrees with plan of treatment. I have discussed any further diagnostic evaluation that may be needed or ordered today. We also reviewed her medications today. she has been encouraged to call the office with any questions or concerns that should arise related to todays visit.    Counseling:    Orders Placed This Encounter  Procedures  . Ambulatory referral to Infectious Disease    This patient was seen by Leretha Pol, FNP- C in Collaboration with Dr Lavera Guise as a part of collaborative care agreement  Time spent: 47 Minutes   Dr Lavera Guise Internal medicine

## 2017-10-12 DIAGNOSIS — Z1159 Encounter for screening for other viral diseases: Secondary | ICD-10-CM | POA: Diagnosis not present

## 2017-10-12 DIAGNOSIS — Z8619 Personal history of other infectious and parasitic diseases: Secondary | ICD-10-CM | POA: Diagnosis not present

## 2017-10-12 DIAGNOSIS — Z1389 Encounter for screening for other disorder: Secondary | ICD-10-CM | POA: Diagnosis not present

## 2017-10-12 DIAGNOSIS — F4320 Adjustment disorder, unspecified: Secondary | ICD-10-CM | POA: Diagnosis not present

## 2017-10-12 DIAGNOSIS — Z1211 Encounter for screening for malignant neoplasm of colon: Secondary | ICD-10-CM | POA: Diagnosis not present

## 2017-10-23 ENCOUNTER — Telehealth: Payer: Self-pay

## 2017-10-23 NOTE — Telephone Encounter (Signed)
PT called today to inform office she will be arriving at little early for her appt, and would like for the office to be aware of this as well Natalie Knox, Natalie Knox

## 2017-10-26 DIAGNOSIS — Z1211 Encounter for screening for malignant neoplasm of colon: Secondary | ICD-10-CM | POA: Diagnosis not present

## 2017-10-29 ENCOUNTER — Ambulatory Visit: Payer: Medicare HMO | Admitting: Family

## 2017-10-29 NOTE — Progress Notes (Deleted)
Subjective:    Patient ID: Natalie Knox, female    DOB: 1950-08-04, 67 y.o.   MRN: [id number]  No chief complaint on file.   HPI:  Natalie Knox is a 67 y.o. female who presents today    No Known Allergies    Outpatient Medications Prior to Visit  Medication Sig Dispense Refill  . amLODipine (NORVASC) 10 MG tablet Take 1 tablet (10 mg total) by mouth daily. 90 tablet 4  . docusate sodium (COLACE) 100 MG capsule Take 1 capsule (100 mg total) by mouth 2 (two) times daily as needed for mild constipation. 10 capsule 0  . folic acid (FOLVITE) 1 MG tablet Take 1 tablet (1 mg total) by mouth daily. 30 tablet 3  . lidocaine (XYLOCAINE) 2 % solution Apply small amount to affected area QID prn pain 15 mL 1  . lisinopril (PRINIVIL,ZESTRIL) 20 MG tablet Take 1 tablet (20 mg total) by mouth daily. 90 tablet 1  . Multiple Vitamins-Minerals (MULTIVITAMIN WITH MINERALS) tablet Take 1 tablet by mouth daily. 120 tablet 2  . naproxen (NAPROSYN) 500 MG tablet Take 500 mg by mouth 2 (two) times daily as needed.    . nicotine (NICODERM CQ) 14 mg/24hr patch Apply one patch to skin QD prn smoking cessation. 28 patch 2  . polyethylene glycol powder (GLYCOLAX/MIRALAX) powder Take 17 g by mouth daily. 3350 g 1  . pravastatin (PRAVACHOL) 40 MG tablet Take 40 mg by mouth at bedtime.     . sulfamethoxazole-trimethoprim (BACTRIM DS,SEPTRA DS) 800-160 MG tablet Take 1 tablet by mouth 2 (two) times daily. 20 tablet 0  . thiamine (VITAMIN B-1) 100 MG tablet Take 1 tablet (100 mg total) by mouth daily. 30 tablet 3   No facility-administered medications prior to visit.      Past Medical History:  Diagnosis Date  . Allergic rhinitis   . Hyperlipidemia   . Hypertension       Past Surgical History:  Procedure Laterality Date  . UPPER GI ENDOSCOPY        Family History  Problem Relation Age of Onset  . Breast cancer Sister       Social History   Socioeconomic History  . Marital status:  Single    Spouse name: Not on file  . Number of children: Not on file  . Years of education: Not on file  . Highest education level: Not on file  Occupational History  . Not on file  Social Needs  . Financial resource strain: Not on file  . Food insecurity:    Worry: Not on file    Inability: Not on file  . Transportation needs:    Medical: Not on file    Non-medical: Not on file  Tobacco Use  . Smoking status: Current Every Day Smoker    Packs/day: 0.50    Types: Cigarettes  . Smokeless tobacco: Never Used  Substance and Sexual Activity  . Alcohol use: Yes    Frequency: Never    Comment: two days a week  . Drug use: No  . Sexual activity: Not on file  Lifestyle  . Physical activity:    Days per week: Not on file    Minutes per session: Not on file  . Stress: Not on file  Relationships  . Social connections:    Talks on phone: Not on file    Gets together: Not on file    Attends religious service: Not on file    Active  member of club or organization: Not on file    Attends meetings of clubs or organizations: Not on file    Relationship status: Not on file  . Intimate partner violence:    Fear of current or ex partner: Not on file    Emotionally abused: Not on file    Physically abused: Not on file    Forced sexual activity: Not on file  Other Topics Concern  . Not on file  Social History Narrative  . Not on file      Review of Systems     Objective:    There were no vitals taken for this visit. Nursing note and vital signs reviewed.  Physical Exam      Assessment & Plan:   Problem List Items Addressed This Visit    None       I am having Io M. Yazzie maintain her pravastatin, docusate sodium, multivitamin with minerals, naproxen, amLODipine, nicotine, lisinopril, polyethylene glycol powder, sulfamethoxazole-trimethoprim, lidocaine, thiamine, and folic acid.   No orders of the defined types were placed in this encounter.    Follow-up:  No follow-ups on file.    Terri Piedra, MSN, FNP-C Nurse Practitioner [facility] Office phone: [phone] Pager: [place] number: [phone]

## 2017-10-30 ENCOUNTER — Telehealth: Payer: Self-pay

## 2017-10-30 NOTE — Telephone Encounter (Signed)
Patient called to cancel appointment with Terri Piedra, NP for 8/13. Pt stated she has another appointment same day and will call us back to reschedule.  [place]

## 2017-11-03 ENCOUNTER — Ambulatory Visit: Payer: Medicare HMO | Admitting: Family

## 2017-11-09 DIAGNOSIS — Z8619 Personal history of other infectious and parasitic diseases: Secondary | ICD-10-CM | POA: Diagnosis not present

## 2017-11-09 DIAGNOSIS — D649 Anemia, unspecified: Secondary | ICD-10-CM | POA: Diagnosis not present

## 2017-11-11 DIAGNOSIS — E538 Deficiency of other specified B group vitamins: Secondary | ICD-10-CM | POA: Diagnosis not present

## 2017-11-11 DIAGNOSIS — Z8661 Personal history of infections of the central nervous system: Secondary | ICD-10-CM | POA: Diagnosis not present

## 2017-11-11 DIAGNOSIS — R4182 Altered mental status, unspecified: Secondary | ICD-10-CM | POA: Diagnosis not present

## 2017-11-16 DIAGNOSIS — A5219 Other symptomatic neurosyphilis: Secondary | ICD-10-CM | POA: Diagnosis not present

## 2017-11-16 DIAGNOSIS — R410 Disorientation, unspecified: Secondary | ICD-10-CM | POA: Diagnosis not present

## 2017-11-16 DIAGNOSIS — Z8619 Personal history of other infectious and parasitic diseases: Secondary | ICD-10-CM | POA: Diagnosis not present

## 2017-11-20 DIAGNOSIS — Z8619 Personal history of other infectious and parasitic diseases: Secondary | ICD-10-CM | POA: Diagnosis not present

## 2017-12-11 ENCOUNTER — Other Ambulatory Visit: Payer: Self-pay

## 2017-12-11 DIAGNOSIS — I10 Essential (primary) hypertension: Secondary | ICD-10-CM

## 2017-12-11 MED ORDER — LISINOPRIL 20 MG PO TABS: 20.0000 mg | ORAL_TABLET | Freq: Every day | ORAL | 1 refills | Status: DC

## 2017-12-15 ENCOUNTER — Telehealth: Payer: Self-pay

## 2017-12-15 NOTE — Telephone Encounter (Signed)
lmom to pt about lisinopril she is taking 20 mg or 40 mg

## 2017-12-16 ENCOUNTER — Other Ambulatory Visit: Payer: Self-pay

## 2017-12-16 ENCOUNTER — Telehealth: Payer: Self-pay

## 2017-12-16 NOTE — Telephone Encounter (Signed)
Spoke with pt daughter [facility] increase lisinopril  40 mg and also called [employer] and canceled lisinopril 20 mg and pt had appt next month for physical we change on system  Next month

## 2017-12-18 ENCOUNTER — Other Ambulatory Visit: Payer: Self-pay | Admitting: Physician Assistant

## 2017-12-18 DIAGNOSIS — H9190 Unspecified hearing loss, unspecified ear: Secondary | ICD-10-CM

## 2017-12-25 ENCOUNTER — Other Ambulatory Visit: Payer: Self-pay | Admitting: Family Medicine

## 2017-12-25 DIAGNOSIS — E2839 Other primary ovarian failure: Secondary | ICD-10-CM

## 2018-01-04 ENCOUNTER — Ambulatory Visit (INDEPENDENT_AMBULATORY_CARE_PROVIDER_SITE_OTHER): Payer: Medicare HMO | Admitting: Nurse Practitioner

## 2018-01-04 ENCOUNTER — Encounter: Payer: Self-pay | Admitting: Nurse Practitioner

## 2018-01-04 VITALS — BP 174/75 | HR 90 | Resp 16 | Ht 66.0 in | Wt 164.8 lb

## 2018-01-04 DIAGNOSIS — I10 Essential (primary) hypertension: Secondary | ICD-10-CM

## 2018-01-04 DIAGNOSIS — R300 Dysuria: Secondary | ICD-10-CM

## 2018-01-04 DIAGNOSIS — Z0001 Encounter for general adult medical examination with abnormal findings: Secondary | ICD-10-CM | POA: Diagnosis not present

## 2018-01-04 DIAGNOSIS — F10259 Alcohol dependence with alcohol-induced psychotic disorder, unspecified: Secondary | ICD-10-CM | POA: Diagnosis not present

## 2018-01-04 DIAGNOSIS — A523 Neurosyphilis, unspecified: Secondary | ICD-10-CM

## 2018-01-04 DIAGNOSIS — R0989 Other specified symptoms and signs involving the circulatory and respiratory systems: Secondary | ICD-10-CM

## 2018-01-04 MED ORDER — FOLIC ACID 1 MG PO TABS: 1.0000 mg | ORAL_TABLET | Freq: Every day | ORAL | 3 refills | Status: DC

## 2018-01-04 NOTE — Progress Notes (Signed)
[facility] [place], [place] [address]  Internal MEDICINE  Office Visit Note  Patient Name: Natalie Knox  [id number]  [id number]  Date of Service: 01/04/2018   Pt is here for routine health maintenance examination   Chief Complaint  Patient presents with  . Medicare Wellness    4 month well visit     The patient is hard of hearing. States that she is going to get her new hearing aid later this week *01/06/2018).  She is being treated at Infectious disease in [facility]. Continues to have blood monitoring and antibiotic treatment with penicllin injections. Lumbar punctures are also monitored every 6 months.  Blood pressure is elevated. She is taking lisinopril 40mg  and amlodipine 10 mg daily. Overall, doing well. Did have carotid dopplers ordered, but has not had this done yet.    Current Medication: Outpatient Encounter Medications as of 01/04/2018  Medication Sig  . amLODipine (NORVASC) 10 MG tablet Take 1 tablet (10 mg total) by mouth daily.  . folic acid (FOLVITE) 1 MG tablet Take 1 tablet (1 mg total) by mouth daily.  . hydrochlorothiazide (HYDRODIURIL) 12.5 MG tablet Take 12.5 mg by mouth daily.  [name] lisinopril (PRINIVIL,ZESTRIL) 20 MG tablet Take 1 tablet (20 mg total) by mouth daily.  [name] lisinopril (PRINIVIL,ZESTRIL) 40 MG tablet Take 40 mg by mouth daily. for high blood pressure  . nicotine (NICODERM CQ) 14 mg/24hr patch Apply one patch to skin QD prn smoking cessation.  . pravastatin (PRAVACHOL) 40 MG tablet Take 40 mg by mouth at bedtime.   . vitamin B-12 (CYANOCOBALAMIN) 1000 MCG tablet Take 1,000 mcg by mouth daily.  . [DISCONTINUED] folic acid (FOLVITE) 1 MG tablet Take 1 tablet (1 mg total) by mouth daily.  [name] docusate sodium (COLACE) 100 MG capsule Take 1 capsule (100 mg total) by mouth 2 (two) times daily as needed for mild constipation. (Patient not taking: Reported on 01/04/2018)  . lidocaine (XYLOCAINE) 2 % solution Apply small amount to  affected area QID prn pain (Patient not taking: Reported on 01/04/2018)  . Multiple Vitamins-Minerals (MULTIVITAMIN WITH MINERALS) tablet Take 1 tablet by mouth daily. (Patient not taking: Reported on 01/04/2018)  . naproxen (NAPROSYN) 500 MG tablet Take 500 mg by mouth 2 (two) times daily as needed.  . polyethylene glycol powder (GLYCOLAX/MIRALAX) powder Take 17 g by mouth daily. (Patient not taking: Reported on 01/04/2018)  . sulfamethoxazole-trimethoprim (BACTRIM DS,SEPTRA DS) 800-160 MG tablet Take 1 tablet by mouth 2 (two) times daily. (Patient not taking: Reported on 01/04/2018)  . thiamine (VITAMIN B-1) 100 MG tablet Take 1 tablet (100 mg total) by mouth daily.  . [DISCONTINUED] folic acid (FOLVITE) 1 MG tablet Take 1 tablet (1 mg total) by mouth daily.  . [DISCONTINUED] lisinopril (PRINIVIL,ZESTRIL) 20 MG tablet Take 1 tablet (20 mg total) by mouth daily.  . [DISCONTINUED] Multiple Vitamin (MULTI-VITAMINS) TABS Take by mouth.  . [DISCONTINUED] thiamine (VITAMIN B-1) 100 MG tablet Take 1 tablet (100 mg total) by mouth daily.   No facility-administered encounter medications on file as of 01/04/2018.     Surgical History: Past Surgical History:  Procedure Laterality Date  . UPPER GI ENDOSCOPY      Medical History: Past Medical History:  Diagnosis Date  . Allergic rhinitis   . Hyperlipidemia   . Hypertension     Family History: Family History  Problem Relation Age of Onset  . Breast cancer Sister       Review of Systems  Constitutional: Negative for activity change, appetite change, chills, fatigue and unexpected weight change.  HENT: Negative for congestion, postnasal drip, rhinorrhea, sneezing and sore throat.   Eyes: Negative.  Negative for redness.  Respiratory: Negative for cough, chest tightness, shortness of breath and wheezing.   Cardiovascular: Negative for chest pain and palpitations.       Blood pressure elevated today.   Gastrointestinal: Negative for  abdominal pain, constipation, diarrhea, nausea and vomiting.  Endocrine: Negative for cold intolerance, heat intolerance, polydipsia, polyphagia and polyuria.  Genitourinary: Negative for dysuria, frequency and vaginal pain.  Musculoskeletal: Negative for arthralgias, back pain, joint swelling and neck pain.  Skin: Negative for rash.  Allergic/Immunologic: Negative for environmental allergies.  Neurological: Positive for dizziness and headaches. Negative for tremors, weakness, light-headedness and numbness.       Intermittent.   Hematological: Negative for adenopathy. Does not bruise/bleed easily.  Psychiatric/Behavioral: Positive for behavioral problems (Depression), confusion, dysphoric mood and hallucinations. Negative for sleep disturbance and suicidal ideas. The patient is nervous/anxious.        Very improved since her last visit .     Vital Signs: BP (!) 174/75 (BP Location: Right Arm, Patient Position: Sitting, Cuff Size: Normal)   Pulse 90   Resp 16   Ht 5\' 6"  (1.676 m)   Wt 164 lb 12.8 oz (74.8 kg)   SpO2 100%   BMI 26.60 kg/m    Physical Exam  Constitutional: She is oriented to person, place, and time. She appears well-developed and well-nourished. No distress.  HENT:  Head: Normocephalic and atraumatic.  Nose: Nose normal.  Mouth/Throat: Oropharynx is clear and moist. No oropharyngeal exudate.  Eyes: Pupils are equal, round, and reactive to light. Conjunctivae and EOM are normal.  Neck: Normal range of motion. Neck supple. JVD present. Carotid bruit is present. No tracheal deviation present. No thyromegaly present.  Soft carotid bruit, bilaterally.   Cardiovascular: Normal rate, regular rhythm, normal heart sounds and intact distal pulses. Exam reveals no gallop and no friction rub.  No murmur heard. Pulmonary/Chest: Effort normal and breath sounds normal. No respiratory distress. She has no wheezes. She has no rales. She exhibits no tenderness. Right breast exhibits  no inverted nipple, no mass, no nipple discharge, no skin change and no tenderness. Left breast exhibits no inverted nipple, no mass, no nipple discharge, no skin change and no tenderness.  Abdominal: Soft. Bowel sounds are normal. There is no tenderness.  Musculoskeletal: Normal range of motion.  Lymphadenopathy:    She has no cervical adenopathy.  Neurological: She is alert and oriented to person, place, and time. No cranial nerve deficit.  Skin: Skin is warm and dry. Capillary refill takes less than 2 seconds. She is not diaphoretic.  Psychiatric: Her speech is normal and behavior is normal. Judgment and thought content normal. Her mood appears anxious.  Nursing note and vitals reviewed.  Depression screen [facility] 2/9 01/04/2018 08/31/2017  Decreased Interest 0 0  Down, Depressed, Hopeless 0 0  PHQ - 2 Score 0 0    Functional Status Survey: Is the patient deaf or have difficulty hearing?: Yes(pt getting hearing aids) Does the patient have difficulty seeing, even when wearing glasses/contacts?: No Does the patient have difficulty concentrating, remembering, or making decisions?: No Does the patient have difficulty walking or climbing stairs?: No Does the patient have difficulty dressing or bathing?: No Does the patient have difficulty doing errands alone such as visiting a doctor's office or shopping?: No  MMSE - Mini Mental  State Exam 01/04/2018  Orientation to time 5  Orientation to Place 5  Registration 3  Attention/ Calculation 5  Recall 3  Language- name 2 objects 2  Language- repeat 1  Language- follow 3 step command 3  Language- read & follow direction 1  Write a sentence 1  Copy design 1  Total score 30    Fall Risk  01/04/2018 08/31/2017  Falls in the past year? No No     Assessment/Plan:  1. Encounter for general adult medical examination with abnormal findings Annual health maintenance exam today  2. Essential hypertension No medication changes today. Continue  lisinopril, amlodipine, and HCTZ as prescribed.   3. Jugular venous distension Reorder carotid doppler for further evaluation.  - US Carotid Duplex Bilateral; Future  4. Alcohol dependence with alcohol-induced psychotic disorder with complication (HCC) - folic acid (FOLVITE) 1 MG tablet; Take 1 tablet (1 mg total) by mouth daily.  Dispense: 90 tablet; Refill: 3  5. Neurosyphilis in adult Continue visits with infectiuos disease as scheduled.   6. Dysuria - UA/M w/rflx Culture, Routine   General Counseling: Christabel verbalizes understanding of the findings of todays visit and agrees with plan of treatment. I have discussed any further diagnostic evaluation that may be needed or ordered today. We also reviewed her medications today. she has been encouraged to call the office with any questions or concerns that should arise related to todays visit.    Counseling:  Hypertension Counseling:   The following hypertensive lifestyle modification were recommended and discussed:  1. Limiting alcohol intake to less than 1 oz/day of ethanol:(24 oz of beer or 8 oz of wine or 2 oz of 100-proof whiskey). 2. Take baby ASA 81 mg daily. 3. Importance of regular aerobic exercise and losing weight. 4. Reduce dietary saturated fat and cholesterol intake for overall cardiovascular health. 5. Maintaining adequate dietary potassium, calcium, and magnesium intake. 6. Regular monitoring of the blood pressure. 7. Reduce sodium intake to less than 100 mmol/day (less than 2.3 gm of sodium or less than 6 gm of sodium choride)   This patient was seen by [place] with Dr Lavera Guise as a part of collaborative care agreement  Orders Placed This Encounter  Procedures  . US Carotid Duplex Bilateral  . UA/M w/rflx Culture, Routine    Meds ordered this encounter  Medications  . folic acid (FOLVITE) 1 MG tablet    Sig: Take 1 tablet (1 mg total) by mouth daily.    Dispense:  90 tablet     Refill:  3    Order Specific Question:   Supervising Provider    Answer:   Lavera Guise [[id number]]    Time spent: [place], MD  Internal Medicine

## 2018-01-05 LAB — UA/M W/RFLX CULTURE, ROUTINE
Bilirubin, UA: NEGATIVE
Glucose, UA: NEGATIVE
KETONES UA: NEGATIVE
Leukocytes, UA: NEGATIVE
NITRITE UA: NEGATIVE
PROTEIN UA: NEGATIVE
Specific Gravity, UA: 1.022 (ref 1.005–1.030)
UUROB: 0.2 mg/dL (ref 0.2–1.0)
pH, UA: 5 (ref 5.0–7.5)

## 2018-01-05 LAB — MICROSCOPIC EXAMINATION: Bacteria, UA: NONE SEEN

## 2018-01-06 ENCOUNTER — Ambulatory Visit: Payer: Medicare HMO

## 2018-01-13 ENCOUNTER — Other Ambulatory Visit: Payer: Self-pay

## 2018-01-13 DIAGNOSIS — F10259 Alcohol dependence with alcohol-induced psychotic disorder, unspecified: Secondary | ICD-10-CM

## 2018-01-13 MED ORDER — FOLIC ACID 1 MG PO TABS: 1.0000 mg | ORAL_TABLET | Freq: Every day | ORAL | 3 refills | Status: DC

## 2018-01-15 ENCOUNTER — Other Ambulatory Visit: Payer: Self-pay

## 2018-01-18 ENCOUNTER — Other Ambulatory Visit: Payer: Self-pay | Admitting: Nurse Practitioner

## 2018-01-18 DIAGNOSIS — I10 Essential (primary) hypertension: Secondary | ICD-10-CM

## 2018-01-18 MED ORDER — LISINOPRIL 20 MG PO TABS
20.0000 mg | ORAL_TABLET | Freq: Every day | ORAL | 1 refills | Status: DC
Start: 2018-01-18 — End: 2018-07-08

## 2018-01-18 NOTE — Progress Notes (Signed)
Sent new prescription for l[facility]opril 20mg  [facility] [place] per pharmacy request. Verified correct dosing.

## 2018-01-22 ENCOUNTER — Other Ambulatory Visit: Payer: Self-pay

## 2018-03-08 ENCOUNTER — Ambulatory Visit: Payer: Self-pay | Admitting: Nurse Practitioner

## 2018-06-29 DIAGNOSIS — I10 Essential (primary) hypertension: Secondary | ICD-10-CM | POA: Diagnosis not present

## 2018-07-02 DIAGNOSIS — I10 Essential (primary) hypertension: Secondary | ICD-10-CM | POA: Diagnosis not present

## 2018-07-08 ENCOUNTER — Other Ambulatory Visit: Payer: Self-pay

## 2018-07-08 ENCOUNTER — Ambulatory Visit (INDEPENDENT_AMBULATORY_CARE_PROVIDER_SITE_OTHER): Payer: Medicare Other | Admitting: Physician Assistant

## 2018-07-08 ENCOUNTER — Encounter: Payer: Self-pay | Admitting: Physician Assistant

## 2018-07-08 VITALS — BP 153/78 | HR 61 | Temp 97.8°F | Ht 66.0 in | Wt 176.8 lb

## 2018-07-08 DIAGNOSIS — Z1211 Encounter for screening for malignant neoplasm of colon: Secondary | ICD-10-CM

## 2018-07-08 DIAGNOSIS — Z114 Encounter for screening for human immunodeficiency virus [HIV]: Secondary | ICD-10-CM

## 2018-07-08 DIAGNOSIS — I10 Essential (primary) hypertension: Secondary | ICD-10-CM

## 2018-07-08 DIAGNOSIS — Z8619 Personal history of other infectious and parasitic diseases: Secondary | ICD-10-CM

## 2018-07-08 DIAGNOSIS — Z8661 Personal history of infections of the central nervous system: Secondary | ICD-10-CM

## 2018-07-08 DIAGNOSIS — Z1159 Encounter for screening for other viral diseases: Secondary | ICD-10-CM | POA: Diagnosis not present

## 2018-07-08 DIAGNOSIS — E785 Hyperlipidemia, unspecified: Secondary | ICD-10-CM | POA: Diagnosis not present

## 2018-07-08 DIAGNOSIS — F1011 Alcohol abuse, in remission: Secondary | ICD-10-CM

## 2018-07-08 MED ORDER — AMLODIPINE BESYLATE 5 MG PO TABS: 5.0000 mg | ORAL_TABLET | Freq: Every day | ORAL | 0 refills | Status: DC

## 2018-07-08 NOTE — Progress Notes (Signed)
Patient: Natalie Knox, Female    DOB: [birth year], 68 y.o.   MRN: [id number] Visit Date: 07/08/2018  Today's Provider: Trinna Post, PA-C   Chief Complaint  Patient presents with  . Establish Care   Subjective:     New Patient: Natalie Knox is a 68 y.o. female who presents today to Establish Care. Patient was previously seen by [facility] and Leretha Pol NP at [facility]. She feels well. She reports exercising lightly. She reports she is sleeping well.  Patient lives in [place] with her daughter and granddaughter. She was previously working at the [employer] doing [employer]. She has recently been laid off due to the coronavirus pandemic.   HTN: Patient brings her medications with her today. She is currently on lisinopril 40 mg daily and HCTZ 25 mg daily. There is a historical medication in her chart of 10 mg amlodipine daily which she reports her previous provider discontinued for unknown reasons. She reports she does not like HCTZ because it makes her go to the bathroom frequently and she is supposed to avoid sunlight with this.   HLD: She takes pravastatin 40 mg daily.   Neurosyphilis: Patient has a history of  Neurosyphilis diagnosed one year ago in 05/2017. She presented to the emergency room with confusion and paranoia largely directed towards her daughter. Her MRI brain was negative. RPR was positive on 05/30/2017 at 1:64. Lumbar puncture at the time revealed (+) VDRL and pleocytosis. Patient was started on IV PCN via PICC lin on 06/06/2017 and this was continued for 2 weeks. She was followed outpatient for this by Dr. Ola Spurr with [facility] Infectious disease. She last saw Dr. Verita Lamb around 07/2017. Apparently he had left the practice and she transferred her care to [facility]. In 10/2017 she underwent a second lumbar puncture due to re-emergence of neuropsychiatric symptoms which showed resolution of pleocytosis and 4 fold decrease in VDRL  from 1:16 to 1:4 to suggest no active neurosyphilis. Impression at that time was that persistent symptoms may be due to long term sequela of neurosyphilis or due to dementia from unrelated etiology. RPR was stable at 1:64, impression of this was expected to be decreasing but can take up to 2 years. She was instructed to follow up in 6 months from her most recent 11/2017 visit with [facility] to recheck RPR but so far has not done this.   The patient has also been seen by [facility] most recently on 05/17/2018 by Chipper Herb, NP. Per review of this note, patient reported that she felt her memory was stable and that any issue were caused by blood pressure medications. SLUMS score from that visit was 8/30. MMSE 27/30.   Alcohol Abuse: Patient reports she has use alcohol to excess in the past, including both liquor and beer. She reports she hasn't had a drink at all for one year. She had been treated with B12 and folate prior.  Collateral Information: Was able to talk to Nat Math on the phone today prior to this patient's visit. Daughter reports her mother lives with her and that initial neurosyphilis was diagnosed due to increasing confusion and paranoia. She reports prior to this illness, her mother had no memory or behavioral issues. She reports her mother currently works for [employer] and folding table cloths. She reports her mother will sometimes cook for herself, making sandwiches or bacon, but mostly she eats her daughter's cooking. Daughter reports  her mother drives and has not gotten lost. Daughter reports her mother completes all of her ADLs independently and manages her bills. She reports her mother thinks she is taking her mail and so has been hiding her mail in the car, at one point she got a PO box. Reports her mother does not wander off or get los, she recognizes her family members and she has normal sleeping patterns.   Daughter reports her mother is unable to  reliably understand medical terminology. She also reports her mother is hard of hearing and has a hearing aid that may not work well. She reports her mother is frequently trying to read lips and this might impact how she takes in information.   Maintenance: Mammogram normal last year. She is due for colon cancer screening, patient reports she hasn't had colonoscopy in 10+ years. Denies family history of colon cancer. She is also due for second pneumonia shot.  -----------------------------------------------------------------   Review of Systems  Constitutional: Negative.   HENT: Positive for dental problem, hearing loss and tinnitus.   Eyes: Negative.   Respiratory: Negative.   Cardiovascular: Negative.   Gastrointestinal: Negative.   Endocrine: Negative.   Genitourinary: Negative.   Musculoskeletal: Negative.   Skin: Negative.   Allergic/Immunologic: Negative.   Neurological: Negative.   Hematological: Negative.   Psychiatric/Behavioral: Negative.     Social History      She  reports that she quit smoking about 2 months ago. Her smoking use included cigarettes. She smoked 0.50 packs per day. She has never used smokeless tobacco. She reports previous alcohol use. She reports that she does not use drugs.       Social History   Socioeconomic History  . Marital status: Single    Spouse name: Not on file  . Number of children: Not on file  . Years of education: Not on file  . Highest education level: Not on file  Occupational History  . Not on file  Social Needs  . Financial resource strain: Not on file  . Food insecurity:    Worry: Not on file    Inability: Not on file  . Transportation needs:    Medical: Not on file    Non-medical: Not on file  Tobacco Use  . Smoking status: Former Smoker    Packs/day: 0.50    Types: Cigarettes    Last attempt to quit: 04/23/2018    Years since quitting: 0.2  . Smokeless tobacco: Never Used  Substance and Sexual Activity  . Alcohol  use: Not Currently    Frequency: Never    Comment: no drink since march  . Drug use: No  . Sexual activity: Not on file  Lifestyle  . Physical activity:    Days per week: Not on file    Minutes per session: Not on file  . Stress: Not on file  Relationships  . Social connections:    Talks on phone: Not on file    Gets together: Not on file    Attends religious service: Not on file    Active member of club or organization: Not on file    Attends meetings of clubs or organizations: Not on file    Relationship status: Not on file  Other Topics Concern  . Not on file  Social History Narrative  . Not on file    Past Medical History:  Diagnosis Date  . Allergic rhinitis   . Hyperlipidemia   . Hypertension  Patient Active Problem List   Diagnosis Date Noted  . Encounter for general adult medical examination with abnormal findings 01/04/2018  . Dysuria 01/04/2018  . Cellulitis and abscess of buttock 09/17/2017  . Constipation 08/19/2017  . Neurosyphilis in adult 08/19/2017  . History of neurosyphilis 07/14/2017  . Alcohol dependence with alcohol-induced psychotic disorder with complication ([place]) 94/70/9628  . Jugular venous distension 06/20/2017  . Essential hypertension 06/20/2017  . Tobacco dependence 06/20/2017  . Deficiency of other specified B group vitamins 06/17/2017  . Metabolic encephalopathy 36/62/9476  . Altered mental status     Past Surgical History:  Procedure Laterality Date  . UPPER GI ENDOSCOPY      Family History        Family Status  Relation Name Status  . Sister  (Not Specified)        Her family history includes Breast cancer in her sister.      No Known Allergies   Current Outpatient Medications:  .  amLODipine (NORVASC) 10 MG tablet, Take 1 tablet (10 mg total) by mouth daily., Disp: 90 tablet, Rfl: 4 .  folic acid (FOLVITE) 1 MG tablet, Take 1 tablet (1 mg total) by mouth daily., Disp: 90 tablet, Rfl: 3 .  hydrochlorothiazide  (HYDRODIURIL) 12.5 MG tablet, Take 12.5 mg by mouth daily., Disp: , Rfl:  .  lisinopril (PRINIVIL,ZESTRIL) 20 MG tablet, Take 1 tablet (20 mg total) by mouth daily., Disp: 90 tablet, Rfl: 1 .  naproxen (NAPROSYN) 500 MG tablet, Take 500 mg by mouth 2 (two) times daily as needed., Disp: , Rfl:  .  nicotine (NICODERM CQ) 14 mg/24hr patch, Apply one patch to skin QD prn smoking cessation. (Patient not taking: Reported on 07/08/2018), Disp: 28 patch, Rfl: 2 .  pravastatin (PRAVACHOL) 40 MG tablet, Take 40 mg by mouth at bedtime. , Disp: , Rfl:  .  thiamine (VITAMIN B-1) 100 MG tablet, Take 1 tablet (100 mg total) by mouth daily., Disp: 30 tablet, Rfl: 3 .  vitamin B-12 (CYANOCOBALAMIN) 1000 MCG tablet, Take 1,000 mcg by mouth daily., Disp: , Rfl:    Patient Care Team: Paulene Floor as PCP - General (Physician Assistant)    Objective:    Vitals: BP (!) 153/78 (BP Location: Right Arm, Patient Position: Sitting, Cuff Size: Normal)   Pulse 61   Temp 97.8 F (36.6 C) (Oral)   Ht 5\' 6"  (1.676 m)   Wt 176 lb 12.8 oz (80.2 kg)   SpO2 98%   BMI 28.54 kg/m    Vitals:   07/08/18 1009  BP: (!) 153/78  Pulse: 61  Temp: 97.8 F (36.6 C)  TempSrc: Oral  SpO2: 98%  Weight: 176 lb 12.8 oz (80.2 kg)  Height: 5\' 6"  (1.676 m)     Physical Exam Constitutional:      Appearance: Normal appearance.  Cardiovascular:     Rate and Rhythm: Normal rate and regular rhythm.     Heart sounds: Normal heart sounds.  Pulmonary:     Effort: Pulmonary effort is normal.     Breath sounds: Normal breath sounds.  Skin:    General: Skin is warm and dry.  Neurological:     Mental Status: She is alert. She is disoriented.     Comments: Repeatedly walks into staff office instead of through office door. Needs to be explicitly redirected. Some responses to direct questioning trail off into some tangential speech.       Depression Screen PHQ 2/9 Scores 07/08/2018  01/04/2018 08/31/2017  PHQ - 2 Score 4 0  0  PHQ- 9 Score 5 - -       Assessment & Plan:     Routine Health Maintenance and Physical Exam  Exercise Activities and Dietary recommendations Goals   None     Immunization History  Administered Date(s) Administered  . Influenza, High Dose Seasonal PF 05/17/2017  . Influenza-Unspecified 01/19/2017  . Pneumococcal Polysaccharide-23 05/30/2017    Health Maintenance  Topic Date Due  . Hepatitis C Screening  [birth year]  . TETANUS/TDAP  09/30/1967  . COLONOSCOPY  09/30/1998  . PNA vac Low Risk Adult (2 of 2 - PCV13) 05/31/2018  . INFLUENZA VACCINE  10/23/2018  . MAMMOGRAM  08/15/2019  . DEXA SCAN  Completed     Discussed health benefits of physical activity, and encouraged her to engage in regular exercise appropriate for her age and condition.    1. History of neurosyphilis  She was diagnosed with neurosyphilis one year ago. She wants to know how long she had this issue and I have advised her that unfortunately nobody can tell her that. Her last visit with [facility] was in 11/2017. Repeat lumbar puncture showed resolution of neurosyphilis and fourfold decrease of VDRL but stable RPR at 1:64. She was instructed to follow up in clinic in 6 months for visit and repeat labwork but has not done that so I will check RPR today. I do think she still needs to go back to infectious disease as retreatment might be considered pending labwork. I have advised her daughter of this through [facility].   Currently, patient displays some neurocognitive deficits. She is somewhat disoriented today in clinic, needing frequent redirection. She is able to answer some direct questioning, for instance she was able to answer why she does not like HCTZ as it makes her go to the bathroom. She is also able to She is unable to recall what blood pressure medications she had been on previously. She is being followed by neurology for this. There seems to be some question about whether this represents  deficits from neurosyphilis or dementia. During previous appointment with neurology, provider recommended Aricept which patient declined. She has a follow up appointment with neurology on 11/15/2018 to readdress this.    - RPR  2. Essential hypertension  Her blood pressure is uncontrolled today. We will discontinue the hydrochlorothiazide 25 mg daily and keep her on Lisinopril 40 mg daily. I will add amlodipine 5 mg daily to be taken with Lisinopril. #30 pills of amlodipine were sent to local pharmacy and a long term prescription was sent to mail order. We will see her in 3 months for this.   I am requesting records from [facility] and also Leretha Pol, NP whom she reports are her previous PCPs.   - CBC with Differential - Comprehensive Metabolic Panel (CMET) - TSH - amLODipine (NORVASC) 5 MG tablet; Take 1 tablet (5 mg total) by mouth daily.  Dispense: 90 tablet; Refill: 0 - amLODipine (NORVASC) 5 MG tablet; Take 1 tablet (5 mg total) by mouth daily.  Dispense: 30 tablet; Refill: 0  3. Hyperlipidemia, unspecified hyperlipidemia type  Cholesterol elevated. Will change pravastatin 40 mg to lipitor 20 mg daily for better control.  - Lipid Profile  4. Need for hepatitis C screening test  - Hepatitis C antibody  5. Encounter for screening for HIV  - HIV antibody (with reflex)  6. Colon cancer screening  - Cologuard  7. History of  alcohol abuse  Patient reports she has not drank in over 1 year.   The entirety of the information documented in the History of Present Illness, Review of Systems and Physical Exam were personally obtained by me. Portions of this information were initially documented by Idelle Jo, CMA and reviewed by me for thoroughness and accuracy.   F/u 3 months for HTN.   I have spent 60 minutes with this patient, >50% of which was spent on counseling and coordination of care.  --------------------------------------------------------------------     Trinna Post, PA-C  [place]

## 2018-07-09 NOTE — Patient Instructions (Signed)

## 2018-07-10 LAB — CBC WITH DIFFERENTIAL/PLATELET
Basophils Absolute: 0 10*3/uL (ref 0.0–0.2)
Basos: 1 %
EOS (ABSOLUTE): 0.2 10*3/uL (ref 0.0–0.4)
Eos: 4 %
Hematocrit: 37.1 % (ref 34.0–46.6)
Hemoglobin: 12.2 g/dL (ref 11.1–15.9)
Immature Grans (Abs): 0 10*3/uL (ref 0.0–0.1)
Immature Granulocytes: 0 %
Lymphocytes Absolute: 1.7 10*3/uL (ref 0.7–3.1)
Lymphs: 38 %
MCH: 29.9 pg (ref 26.6–33.0)
MCHC: 32.9 g/dL (ref 31.5–35.7)
MCV: 91 fL (ref 79–97)
Monocytes Absolute: 0.4 10*3/uL (ref 0.1–0.9)
Monocytes: 10 %
Neutrophils Absolute: 2.1 10*3/uL (ref 1.4–7.0)
Neutrophils: 47 %
Platelets: 298 10*3/uL (ref 150–450)
RBC: 4.08 x10E6/uL (ref 3.77–5.28)
RDW: 12.9 % (ref 11.7–15.4)
WBC: 4.4 10*3/uL (ref 3.4–10.8)

## 2018-07-10 LAB — LIPID PANEL
Chol/HDL Ratio: 2.6 ratio (ref 0.0–4.4)
Cholesterol, Total: 198 mg/dL (ref 100–199)
HDL: 75 mg/dL (ref >39)
LDL Calculated: 110 mg/dL — ABNORMAL HIGH (ref 0–99)
Triglycerides: 66 mg/dL (ref 0–149)
VLDL Cholesterol Cal: 13 mg/dL (ref 5–40)

## 2018-07-10 LAB — RPR, QUANT+TP ABS (REFLEX)
Rapid Plasma Reagin, Quant: 1:32 {titer} — ABNORMAL HIGH
T Pallidum Abs: REACTIVE — AB

## 2018-07-10 LAB — RPR: RPR Ser Ql: REACTIVE — AB

## 2018-07-10 LAB — COMPREHENSIVE METABOLIC PANEL
ALT: 18 IU/L (ref 0–32)
AST: 17 IU/L (ref 0–40)
Albumin/Globulin Ratio: 1.7 (ref 1.2–2.2)
Albumin: 4.4 g/dL (ref 3.8–4.8)
Alkaline Phosphatase: 83 IU/L (ref 39–117)
BUN/Creatinine Ratio: 34 — ABNORMAL HIGH (ref 12–28)
BUN: 33 mg/dL — ABNORMAL HIGH (ref 8–27)
Bilirubin Total: 0.3 mg/dL (ref 0.0–1.2)
CO2: 21 mmol/L (ref 20–29)
Calcium: 10.1 mg/dL (ref 8.7–10.3)
Chloride: 110 mmol/L — ABNORMAL HIGH (ref 96–106)
Creatinine, Ser: 0.96 mg/dL (ref 0.57–1.00)
GFR calc Af Amer: 70 mL/min/{1.73_m2} (ref >59)
GFR calc non Af Amer: 61 mL/min/{1.73_m2} (ref >59)
Globulin, Total: 2.6 g/dL (ref 1.5–4.5)
Glucose: 84 mg/dL (ref 65–99)
Potassium: 4.3 mmol/L (ref 3.5–5.2)
Sodium: 146 mmol/L — ABNORMAL HIGH (ref 134–144)
Total Protein: 7 g/dL (ref 6.0–8.5)

## 2018-07-10 LAB — TSH: TSH: 1.15 u[IU]/mL (ref 0.450–4.500)

## 2018-07-10 LAB — HEPATITIS C ANTIBODY: Hep C Virus Ab: <0.1 s/co ratio (ref 0.0–0.9)

## 2018-07-10 LAB — HIV ANTIBODY (ROUTINE TESTING W REFLEX): HIV Screen 4th Generation wRfx: NONREACTIVE

## 2018-07-12 ENCOUNTER — Encounter: Payer: Self-pay | Admitting: Physician Assistant

## 2018-07-12 ENCOUNTER — Telehealth: Payer: Self-pay

## 2018-07-12 DIAGNOSIS — E785 Hyperlipidemia, unspecified: Secondary | ICD-10-CM

## 2018-07-12 MED ORDER — ATORVASTATIN CALCIUM 20 MG PO TABS: 20.0000 mg | ORAL_TABLET | Freq: Every day | ORAL | 0 refills | Status: DC

## 2018-07-12 NOTE — Telephone Encounter (Signed)
Spoke to patient's daughter regarding results. She would like a 2 week prescription for the [facility] [address] and then the full prescription sent to [facility].

## 2018-07-12 NOTE — Telephone Encounter (Signed)
-----   Message from Trinna Post, [place] sent at 07/09/2018  3:33 PM EDT ----- Blood counts look OK, no anemia. Cholesterol high, would like her to take lipitor 20 mg instead of pravastatin. Can we send this in for HLD #90 with 0 refills. Thyroid hormone normal. Hep C negative. HIV negative. Syphilis testing is downtrending, would still like her to follow up with infectious disease. Sodium a little high would recommend increasing water intake. See her in follow up. Patient may have some trouble understanding these results, please also call her daughter who is listed on DPR.

## 2018-07-13 DIAGNOSIS — Z1211 Encounter for screening for malignant neoplasm of colon: Secondary | ICD-10-CM | POA: Diagnosis not present

## 2018-07-14 ENCOUNTER — Other Ambulatory Visit: Payer: Self-pay | Admitting: Physician Assistant

## 2018-07-14 ENCOUNTER — Encounter: Payer: Self-pay | Admitting: Physician Assistant

## 2018-07-14 DIAGNOSIS — E785 Hyperlipidemia, unspecified: Secondary | ICD-10-CM

## 2018-07-14 MED ORDER — ATORVASTATIN CALCIUM 20 MG PO TABS: 20.0000 mg | ORAL_TABLET | Freq: Every day | ORAL | 0 refills | Status: DC

## 2018-07-20 LAB — COLOGUARD: Cologuard: NEGATIVE

## 2018-07-21 ENCOUNTER — Other Ambulatory Visit: Payer: Self-pay | Admitting: Physician Assistant

## 2018-07-21 DIAGNOSIS — Z1231 Encounter for screening mammogram for malignant neoplasm of breast: Secondary | ICD-10-CM

## 2018-07-29 ENCOUNTER — Other Ambulatory Visit: Payer: Self-pay

## 2018-07-29 DIAGNOSIS — I10 Essential (primary) hypertension: Secondary | ICD-10-CM

## 2018-07-29 NOTE — Telephone Encounter (Signed)
Patient is requesting a refill on amLODipine (NORVASC) 5 MG tablet be sent to [facility].

## 2018-07-31 ENCOUNTER — Other Ambulatory Visit: Payer: Self-pay | Admitting: Physician Assistant

## 2018-07-31 DIAGNOSIS — I10 Essential (primary) hypertension: Secondary | ICD-10-CM

## 2018-08-02 MED ORDER — AMLODIPINE BESYLATE 5 MG PO TABS: 5.0000 mg | ORAL_TABLET | Freq: Every day | ORAL | 0 refills | Status: DC

## 2018-08-04 ENCOUNTER — Encounter: Payer: Self-pay | Admitting: Emergency Medicine

## 2018-08-04 ENCOUNTER — Emergency Department: Payer: Medicare Other

## 2018-08-04 ENCOUNTER — Emergency Department
Admission: EM | Admit: 2018-08-04 | Discharge: 2018-08-04 | Disposition: A | Discharge status: Home / Self Care | Payer: Medicare Other | Attending: Emergency Medicine | Admitting: Emergency Medicine

## 2018-08-04 DIAGNOSIS — I10 Essential (primary) hypertension: Secondary | ICD-10-CM | POA: Diagnosis not present

## 2018-08-04 DIAGNOSIS — Z79899 Other long term (current) drug therapy: Secondary | ICD-10-CM | POA: Diagnosis not present

## 2018-08-04 DIAGNOSIS — J984 Other disorders of lung: Secondary | ICD-10-CM | POA: Diagnosis not present

## 2018-08-04 DIAGNOSIS — Z87891 Personal history of nicotine dependence: Secondary | ICD-10-CM | POA: Diagnosis not present

## 2018-08-04 LAB — COMPREHENSIVE METABOLIC PANEL
ALT: 22 U/L (ref 0–44)
AST: 19 U/L (ref 15–41)
Albumin: 4 g/dL (ref 3.5–5.0)
Alkaline Phosphatase: 76 U/L (ref 38–126)
Anion gap: 9 (ref 5–15)
BUN: 17 mg/dL (ref 8–23)
CO2: 21 mmol/L — ABNORMAL LOW (ref 22–32)
Calcium: 9.7 mg/dL (ref 8.9–10.3)
Chloride: 112 mmol/L — ABNORMAL HIGH (ref 98–111)
Creatinine, Ser: 0.83 mg/dL (ref 0.44–1.00)
GFR calc Af Amer: >60 mL/min (ref >60)
GFR calc non Af Amer: >60 mL/min (ref >60)
Glucose, Bld: 84 mg/dL (ref 70–99)
Potassium: 3.6 mmol/L (ref 3.5–5.1)
Sodium: 142 mmol/L (ref 135–145)
Total Bilirubin: 0.6 mg/dL (ref 0.3–1.2)
Total Protein: 7.5 g/dL (ref 6.5–8.1)

## 2018-08-04 LAB — TROPONIN I: Troponin I: <0.03 ng/mL (ref <0.03)

## 2018-08-04 LAB — CBC WITH DIFFERENTIAL/PLATELET
Abs Immature Granulocytes: 0.02 10*3/uL (ref 0.00–0.07)
Basophils Absolute: 0 10*3/uL (ref 0.0–0.1)
Basophils Relative: 1 %
Eosinophils Absolute: 0.2 10*3/uL (ref 0.0–0.5)
Eosinophils Relative: 3 %
HCT: 37.8 % (ref 36.0–46.0)
Hemoglobin: 12.5 g/dL (ref 12.0–15.0)
Immature Granulocytes: 0 %
Lymphocytes Relative: 29 %
Lymphs Abs: 1.8 10*3/uL (ref 0.7–4.0)
MCH: 30.2 pg (ref 26.0–34.0)
MCHC: 33.1 g/dL (ref 30.0–36.0)
MCV: 91.3 fL (ref 80.0–100.0)
Monocytes Absolute: 0.5 10*3/uL (ref 0.1–1.0)
Monocytes Relative: 8 %
Neutro Abs: 3.6 10*3/uL (ref 1.7–7.7)
Neutrophils Relative %: 59 %
Platelets: 273 10*3/uL (ref 150–400)
RBC: 4.14 MIL/uL (ref 3.87–5.11)
RDW: 13.9 % (ref 11.5–15.5)
WBC: 6 10*3/uL (ref 4.0–10.5)
nRBC: 0 % (ref 0.0–0.2)

## 2018-08-04 MED ORDER — AMLODIPINE BESYLATE 5 MG PO TABS: 5.0000 mg | ORAL_TABLET | Freq: Every day | ORAL | 0 refills | Status: DC

## 2018-08-04 NOTE — Discharge Instructions (Signed)
Please follow up with your primary care provider for concerns.  Take your medications daily as prescribed. Return to the ER for symptoms of concern if unable to schedule an appointment.

## 2018-08-04 NOTE — ED Provider Notes (Signed)
[facility] Emergency Department Provider Note  ___________________________________________   None    (approximate)  I have reviewed the triage vital signs and the nursing notes.   HISTORY  Chief Complaint Hypertension   HPI Natalie Knox is a 68 y.o. female who presents to the emergency department for evaluation of hypertension.  Communication is difficult because the patient is not wearing her hearing aid and is very hard of hearing.  From what I understand, she had a nosebleed last night that lasted only a few minutes, which she has had in the past and has always been related to her blood pressure being elevated.  She went to the fire department this morning to have them check it and it was noted to be 200/100 and EMS was called.  Patient denies any chest pain, shortness of breath, headache, dizziness, or blurred vision.  She states that she has not been taking her medication regularly because she is running low and her doctor's office has not sent her refills to the mail order company so she has been trying to make them last as long as possible. She did take both her medications this morning.    EMS got a different report.  They were advised that she had been out of her blood pressure medications for some time and the primary care provider would not send in additional refills for her.    Past Medical History:  Diagnosis Date  . Allergic rhinitis   . Hyperlipidemia   . Hypertension     Patient Active Problem List   Diagnosis Date Noted  . Encounter for general adult medical examination with abnormal findings 01/04/2018  . Dysuria 01/04/2018  . Cellulitis and abscess of buttock 09/17/2017  . Constipation 08/19/2017  . Neurosyphilis in adult 08/19/2017  . History of neurosyphilis 07/14/2017  . Alcohol dependence with alcohol-induced psychotic disorder with complication ([place]) 97/98/9211  . Jugular venous distension 06/20/2017  . Essential hypertension  06/20/2017  . Tobacco dependence 06/20/2017  . Deficiency of other specified B group vitamins 06/17/2017  . Metabolic encephalopathy 94/17/4081  . Altered mental status     Past Surgical History:  Procedure Laterality Date  . UPPER GI ENDOSCOPY      Prior to Admission medications   Medication Sig Start Date End Date Taking? Authorizing Provider  amLODipine (NORVASC) 5 MG tablet Take 1 tablet (5 mg total) by mouth daily. 08/04/18   Trevia Nop B, FNP  atorvastatin (LIPITOR) 20 MG tablet Take 1 tablet (20 mg total) by mouth daily. 07/12/18   Trinna Post, PA-C  atorvastatin (LIPITOR) 20 MG tablet Take 1 tablet (20 mg total) by mouth daily. 07/14/18   Trinna Post, PA-C  folic acid (FOLVITE) 1 MG tablet Take 1 tablet (1 mg total) by mouth daily. Patient not taking: Reported on 07/08/2018 01/13/18   Ronnell Freshwater, NP  lisinopril (PRINIVIL,ZESTRIL) 40 MG tablet Take 40 mg by mouth daily.    [provider]  naproxen (NAPROSYN) 500 MG tablet Take 500 mg by mouth 2 (two) times daily as needed.    [provider]  pravastatin (PRAVACHOL) 40 MG tablet Take 40 mg by mouth at bedtime.  05/22/17   [provider]  vitamin B-12 (CYANOCOBALAMIN) 1000 MCG tablet Take 1,000 mcg by mouth daily. 11/16/17 11/16/18  [provider]    Allergies Patient has no known allergies.  Family History  Problem Relation Age of Onset  . Breast cancer Sister  Social History Social History   Tobacco Use  . Smoking status: Former Smoker    Packs/day: 0.50    Types: Cigarettes    Last attempt to quit: 04/23/2018    Years since quitting: 0.2  . Smokeless tobacco: Never Used  Substance Use Topics  . Alcohol use: Not Currently    Frequency: Never    Comment: no drink since march  . Drug use: No    Review of Systems  Constitutional: No fever/chills Eyes: No visual changes. ENT: No sore throat. Cardiovascular: Denies chest pain. Respiratory: Denies  shortness of breath. Gastrointestinal: No abdominal pain.  No nausea, no vomiting.  No diarrhea.  No constipation. Genitourinary: Negative for dysuria. Musculoskeletal: Negative for back pain. Skin: Negative for rash. Neurological: Negative for headaches, focal weakness or numbness. ____________________________________________   PHYSICAL EXAM:  VITAL SIGNS: ED Triage Vitals  Enc Vitals Group     BP 08/04/18 1242 (!) 187/73     Pulse Rate 08/04/18 1242 72     Resp 08/04/18 1242 15     Temp 08/04/18 1242 97.9 F (36.6 C)     Temp Source 08/04/18 1242 Oral     SpO2 08/04/18 1239 100 %     Weight 08/04/18 1245 180 lb (81.6 kg)     Height 08/04/18 1245 5\' 6"  (1.676 m)     Head Circumference --      Peak Flow --      Pain Score 08/04/18 1243 0     Pain Loc --      Pain Edu? --      Excl. in [place]? --    Constitutional: Alert and oriented. Well appearing and in no acute distress. Hard of hearing and not wearing hearing aid. Eyes: Conjunctivae are normal. PERRL. EOMI. Pupils 64mm Head: Atraumatic. Nose: No congestion/rhinnorhea. Mouth/Throat: Mucous membranes are moist.  Oropharynx non-erythematous. Neck: No stridor.   Cardiovascular: Normal rate, regular rhythm. Grossly normal heart sounds.  Good peripheral circulation. Respiratory: Normal respiratory effort.  No retractions. Lungs CTAB. Gastrointestinal: Soft and nontender. No distention. No abdominal bruits. Musculoskeletal: No lower extremity tenderness nor edema.  Neurologic:  Normal speech and language. No gross focal neurologic deficits are appreciated. No gait instability. Skin:  Skin is warm, dry and intact. No rash noted. Psychiatric: Mood and affect are normal. Speech and behavior are normal.  ____________________________________________   LABS (all labs ordered are listed, but only abnormal results are displayed)  Labs Reviewed  COMPREHENSIVE METABOLIC PANEL - Abnormal; Notable for the following components:       Result Value   Chloride 112 (*)    CO2 21 (*)    All other components within normal limits  CBC WITH DIFFERENTIAL/PLATELET  TROPONIN I   ____________________________________________  EKG  ED ECG REPORT I, Gaylan Fauver, FNP-BC personally viewed and interpreted this ECG.   Date: 08/04/2018  EKG Time: 1249  Rate: 67  Rhythm: normal EKG, normal sinus rhythm, unchanged from previous tracings, normal sinus rhythm  Axis: normal  Intervals:none  ST&T Change: No ST elevation  ____________________________________________  RADIOLOGY  ED MD interpretation: Chest x-ray negative for cardiopulmonary abnormality.  Official radiology report(s): Dg Chest 2 View  Result Date: 08/04/2018 CLINICAL DATA:  Hypertension EXAM: CHEST - 2 VIEW COMPARISON:  06/19/2017 FINDINGS: Heart size and vascularity normal. Mild scarring in the left lung base unchanged. No acute infiltrate or effusion. Lungs otherwise clear. IMPRESSION: No acute abnormality.  Mild left lower lobe scarring. Electronically Signed   By: Juanda Crumble  Carlis Abbott M.D.   On: 08/04/2018 13:55    ____________________________________________   PROCEDURES  Procedure(s) performed: None  Procedures  Critical Care performed: No  ____________________________________________   INITIAL IMPRESSION / ASSESSMENT AND PLAN / ED COURSE  68 year old female presenting to the emergency department for evaluation of hypertension.  She had an episode of nosebleed last night that lasted a very short period of time.  Because this is happened to her in the past when her blood pressure was elevated, she decided to go to the fire department this morning to have them check it.  She was noted to be hypertensive and was then brought to the emergency department.  On arrival here, her pressure was 187/73.  Patient states she did take her blood pressure medication this morning.  She is concerned because she only has 1 tablet left and no additional refills are available  at the pharmacy at this moment.  She is here mainly to request medication refill.  ----------------------------------------- 2:06 PM on 08/04/2018 -----------------------------------------  Labs, chest x-ray, and EKG are all reassuring.  Patient remained asymptomatic while here in the emergency department.  30-day prescription of Norvasc was submitted to [facility] per her request.  She states that she does not need a refill on her lisinopril.  She was encouraged to call her primary care provider for concerns or return to the emergency department if she is unable to schedule an appointment. ____________________________________________   FINAL CLINICAL IMPRESSION(S) / ED DIAGNOSES  Final diagnoses:  Hypertension, unspecified type     ED Discharge Orders         Ordered    amLODipine (NORVASC) 5 MG tablet  Daily     08/04/18 1405           Note:  This document was prepared using Dragon voice recognition software and may include unintentional dictation errors.    Victorino Dike, FNP 08/04/18 1407    Harvest Dark, MD 08/04/18 1517

## 2018-08-04 NOTE — ED Provider Notes (Signed)
-----------------------------------------   2:02 PM on 08/04/2018 -----------------------------------------  I have seen and evaluated the patient myself.  Patient appears well overall, no concerns at this time.  Blood pressure currently 150/58.  Reassuring medical work-up.  Patient is feeling much better now that her blood pressures come down.  We will discharge home with PCP follow-up.  Patient agreeable to plan of care.   Harvest Dark, MD 08/04/18 [phone]

## 2018-08-04 NOTE — ED Notes (Signed)
22g IV removed from right hand.  Catheter intact, bleeding controlled and dressing in place.

## 2018-08-04 NOTE — ED Triage Notes (Signed)
Pt to ED by EMS from home with c/o of high BP. Upon arrival BP 185/78 and 204/81 in route. Pt states nose bleed last night and has not been taking BP medication. Pt denies headache, chest pain and SOB.

## 2018-08-05 ENCOUNTER — Encounter: Payer: Self-pay | Admitting: Physician Assistant

## 2018-08-05 DIAGNOSIS — I10 Essential (primary) hypertension: Secondary | ICD-10-CM

## 2018-08-05 MED ORDER — AMLODIPINE BESYLATE 5 MG PO TABS: 5.0000 mg | ORAL_TABLET | Freq: Every day | ORAL | 0 refills | Status: DC

## 2018-08-05 NOTE — Telephone Encounter (Signed)
Can we schedule this patient a video visit for HTN follow up? Patient has some cognitive issues and you may need to call her daughter.

## 2018-08-06 ENCOUNTER — Other Ambulatory Visit: Payer: Self-pay | Admitting: Physician Assistant

## 2018-08-06 ENCOUNTER — Encounter: Payer: Self-pay | Admitting: Physician Assistant

## 2018-08-06 DIAGNOSIS — E785 Hyperlipidemia, unspecified: Secondary | ICD-10-CM

## 2018-08-06 MED ORDER — ATORVASTATIN CALCIUM 20 MG PO TABS: 20.0000 mg | ORAL_TABLET | Freq: Every day | ORAL | 1 refills | Status: DC

## 2018-08-06 NOTE — Telephone Encounter (Signed)
Please Review

## 2018-08-18 DIAGNOSIS — I10 Essential (primary) hypertension: Secondary | ICD-10-CM | POA: Diagnosis not present

## 2018-08-24 ENCOUNTER — Other Ambulatory Visit: Payer: Self-pay | Admitting: Physician Assistant

## 2018-08-24 DIAGNOSIS — E785 Hyperlipidemia, unspecified: Secondary | ICD-10-CM

## 2018-08-25 DIAGNOSIS — I10 Essential (primary) hypertension: Secondary | ICD-10-CM | POA: Diagnosis not present

## 2018-09-01 DIAGNOSIS — F17200 Nicotine dependence, unspecified, uncomplicated: Secondary | ICD-10-CM | POA: Diagnosis not present

## 2018-09-01 DIAGNOSIS — I10 Essential (primary) hypertension: Secondary | ICD-10-CM | POA: Diagnosis not present

## 2018-09-15 DIAGNOSIS — I10 Essential (primary) hypertension: Secondary | ICD-10-CM | POA: Diagnosis not present

## 2018-09-30 ENCOUNTER — Encounter: Payer: Self-pay | Admitting: Physician Assistant

## 2018-10-07 ENCOUNTER — Encounter: Payer: Medicare Other | Admitting: Physician Assistant

## 2018-10-14 ENCOUNTER — Ambulatory Visit
Admission: RE | Admit: 2018-10-14 | Discharge: 2018-10-14 | Disposition: A | Discharge status: Home / Self Care | Payer: Medicare HMO | Source: Ambulatory Visit | Attending: Physician Assistant | Admitting: Physician Assistant

## 2018-10-14 DIAGNOSIS — Z1231 Encounter for screening mammogram for malignant neoplasm of breast: Secondary | ICD-10-CM | POA: Diagnosis not present

## 2018-10-21 DIAGNOSIS — I10 Essential (primary) hypertension: Secondary | ICD-10-CM | POA: Diagnosis not present

## 2018-10-21 DIAGNOSIS — F17200 Nicotine dependence, unspecified, uncomplicated: Secondary | ICD-10-CM | POA: Diagnosis not present

## 2018-10-25 DIAGNOSIS — Z1389 Encounter for screening for other disorder: Secondary | ICD-10-CM | POA: Diagnosis not present

## 2018-10-25 DIAGNOSIS — Z0000 Encounter for general adult medical examination without abnormal findings: Secondary | ICD-10-CM | POA: Diagnosis not present

## 2018-10-25 DIAGNOSIS — I10 Essential (primary) hypertension: Secondary | ICD-10-CM | POA: Diagnosis not present

## 2018-10-27 DIAGNOSIS — R011 Cardiac murmur, unspecified: Secondary | ICD-10-CM | POA: Diagnosis not present

## 2018-11-08 DIAGNOSIS — Z1211 Encounter for screening for malignant neoplasm of colon: Secondary | ICD-10-CM | POA: Diagnosis not present

## 2018-11-08 DIAGNOSIS — I10 Essential (primary) hypertension: Secondary | ICD-10-CM | POA: Diagnosis not present

## 2018-11-08 DIAGNOSIS — R011 Cardiac murmur, unspecified: Secondary | ICD-10-CM | POA: Diagnosis not present

## 2018-11-08 DIAGNOSIS — Z0000 Encounter for general adult medical examination without abnormal findings: Secondary | ICD-10-CM | POA: Diagnosis not present

## 2018-11-08 DIAGNOSIS — Z8619 Personal history of other infectious and parasitic diseases: Secondary | ICD-10-CM | POA: Diagnosis not present

## 2018-11-08 DIAGNOSIS — R1084 Generalized abdominal pain: Secondary | ICD-10-CM | POA: Diagnosis not present

## 2018-11-17 ENCOUNTER — Encounter: Payer: Self-pay | Admitting: Internal Medicine

## 2018-11-17 ENCOUNTER — Ambulatory Visit (INDEPENDENT_AMBULATORY_CARE_PROVIDER_SITE_OTHER): Payer: Medicare HMO | Admitting: Internal Medicine

## 2018-11-17 ENCOUNTER — Other Ambulatory Visit: Payer: Self-pay

## 2018-11-17 VITALS — BP 124/58 | HR 69 | Ht 66.0 in | Wt 184.0 lb

## 2018-11-17 DIAGNOSIS — A539 Syphilis, unspecified: Secondary | ICD-10-CM

## 2018-11-17 DIAGNOSIS — R079 Chest pain, unspecified: Secondary | ICD-10-CM

## 2018-11-17 DIAGNOSIS — I10 Essential (primary) hypertension: Secondary | ICD-10-CM | POA: Diagnosis not present

## 2018-11-17 DIAGNOSIS — R0789 Other chest pain: Secondary | ICD-10-CM | POA: Diagnosis not present

## 2018-11-17 DIAGNOSIS — R011 Cardiac murmur, unspecified: Secondary | ICD-10-CM

## 2018-11-17 MED ORDER — ASPIRIN EC 81 MG PO TBEC: 81.0000 mg | DELAYED_RELEASE_TABLET | Freq: Every day | ORAL | 3 refills | Status: DC

## 2018-11-17 NOTE — Progress Notes (Signed)
New Outpatient Visit Date: 11/17/2018  Primary Care Provider: Theotis Burrow, [place] [id number]  Chief Complaint: Heart abnormality and high blood pressue  HPI:  Natalie Knox is a 68 y.o. female who is being seen today as a self-referral for the evaluation of hypertension and heart abnormality. She has a history of hypertension, hyperlipidemia, and neurosyphylis. She was seen in the [facility] emergency department in May for elevated blood pressure (reportedly up to 200/100).  This was preceded by a nosebleed.  Of note, she had been out of her normal blood pressure medications at that time.  No significant abnormalities were identified in the ED.  Today, Natalie Knox reports that she has been feeling well.  She recently established with her new PCP, who reportedly told her that she should see a cardiologist because of some sort of heart issue (no records are available).  She denies a history of heart disease.  She reports a single episode of sharp, left-sided chest pain that occurred about a month ago.  It only lasted for a second or two before resolving on its own.  She also noticed a twitching sensation in her arms last night when going to bed and wonders if that could be related to her heart.  She denies palpitations, lightheadedness, orthopnea, PND, and edema.  Her blood pressure at home has been under better control since carvedilol was added to her other medications.  She notes at home it usually runs in the 140s/90s.  Last year, Natalie Knox was noted to have increasing confusion with delusions.  This ultimately led to a diagnosis of neurosyphilis, which was treated with IV penicillin.  Her granddaughter, who accompanies her today, reports that Natalie Knox is back to her baseline in regards to cognitive function.  --------------------------------------------------------------------------------------------------  Cardiovascular History & Procedures:  Cardiovascular Problems:  Heart murmur  Risk Factors:  Hypertension, prior tobacco use, and age greater than 73  Cath/PCI:  None  CV Surgery:  None  EP Procedures and Devices:  None  Non-Invasive Evaluation(s):  None  Recent CV Pertinent Labs: Lab Results  Component Value Date   CHOL 198 07/08/2018   HDL 75 07/08/2018   LDLCALC 110 (H) 07/08/2018   TRIG 66 07/08/2018   CHOLHDL 2.6 07/08/2018   K 3.6 08/04/2018   BUN 17 08/04/2018   BUN 33 (H) 07/08/2018   CREATININE 0.83 08/04/2018    --------------------------------------------------------------------------------------------------  Past Medical History:  Diagnosis Date  . Allergic rhinitis   . Hyperlipidemia   . Hypertension   . Neurosyphilis     Past Surgical History:  Procedure Laterality Date  . UPPER GI ENDOSCOPY      Current Meds  Medication Sig  . amLODipine (NORVASC) 10 MG tablet Take 10 mg by mouth daily.  . carvedilol (COREG) 6.25 MG tablet Take 6.25 mg by mouth 2 (two) times daily with a meal.  . lisinopril (PRINIVIL,ZESTRIL) 40 MG tablet Take 40 mg by mouth daily.  . pravastatin (PRAVACHOL) 40 MG tablet Take 40 mg by mouth daily.  . vitamin B-12 (CYANOCOBALAMIN) 1000 MCG tablet Take 1,000 mcg by mouth daily.    Allergies: Patient has no known allergies.  Social History   Tobacco Use  . Smoking status: Former Smoker    Packs/day: 0.50    Years: 25.00    Pack years: 12.50    Types: Cigarettes    Quit date: 05/2018    Years since quitting: 0.4  . Smokeless tobacco: Never  Used  Substance Use Topics  . Alcohol use: Not Currently    Frequency: Never    Comment: no drink since march  . Drug use: No    Family History  Problem Relation Age of Onset  . Breast cancer Sister   . Heart attack Mother 70    Review of Systems: A 12-system review of systems was performed and was negative except as noted in the HPI.   --------------------------------------------------------------------------------------------------  Physical Exam: BP (!) 124/58 (BP Location: Right Arm, Patient Position: Sitting, Cuff Size: Normal)   Pulse 69   Ht 5\' 6"  (1.676 m)   Wt 184 lb (83.5 kg)   BMI 29.70 kg/m   General: NAD. HEENT: No conjunctival pallor or scleral icterus.  Facemask in place.  Neck: Supple without lymphadenopathy, thyromegaly, JVD, or HJR. No carotid bruit. Lungs: Normal work of breathing. Clear to auscultation bilaterally without wheezes or crackles. Heart: Regular rate and rhythm with 1/6 systolic murmur.  No rubs or gallops.  Nondisplaced PMI.  Abd: Bowel sounds present. Soft, NT/ND without hepatosplenomegaly Ext: No lower extremity edema. Radial, PT, and DP pulses are 2+ bilaterally Skin: Warm and dry without rash. Neuro: CNIII-XII intact. Strength and fine-touch sensation intact in upper and lower extremities bilaterally. Psych: Normal mood and affect.  EKG: Normal sinus rhythm with low voltage, poor R wave progression, and lateral T wave inversions.  No significant change from prior tracing on 08/04/2018.  Lab Results  Component Value Date   WBC 6.0 08/04/2018   HGB 12.5 08/04/2018   HCT 37.8 08/04/2018   MCV 91.3 08/04/2018   PLT 273 08/04/2018    Lab Results  Component Value Date   NA 142 08/04/2018   K 3.6 08/04/2018   CL 112 (H) 08/04/2018   CO2 21 (L) 08/04/2018   BUN 17 08/04/2018   CREATININE 0.83 08/04/2018   GLUCOSE 84 08/04/2018   ALT 22 08/04/2018    Lab Results  Component Value Date   CHOL 198 07/08/2018   HDL 75 07/08/2018   LDLCALC 110 (H) 07/08/2018   TRIG 66 07/08/2018   CHOLHDL 2.6 07/08/2018    --------------------------------------------------------------------------------------------------  ASSESSMENT AND PLAN: Hypertension: Blood pressure well controlled today on current regimen.  I do not recommend any medication changes at this time.  Ongoing management  per PCP.  Heart murmur and history of syphilis: Patient was noted to have neurosyphilis last year and completed a course of IV penicillin.  She has a subtle heart murmur on exam today.  EKG also shows poor R wave progression and lateral T wave inversions.  As tertiary syphilis can include cardiac involvement and aortopathy, I recommend obtaining a transthoracic echocardiogram.  Chest pain: Patient reports a single episode of sharp chest pain while in bed, which is not typical of coronary insufficiency.  EKG shows poor R wave progression and lateral T wave inversions.  We will begin with a transthoracic echocardiogram; based on results of the echocardiogram, we will readdress utility of ischemia evaluation.  In the meantime, I have recommended addition of aspirin 81 mg daily.  Follow-up: Return to clinic in 4 to 6 weeks.  [name], MD 11/18/2018 8:07 PM

## 2018-11-17 NOTE — Patient Instructions (Signed)
Medication Instructions:  Your physician has recommended you make the following change in your medication:  1- START Aspirin 81 mg by mouth once a day.  If you need a refill on your cardiac medications before your next appointment, please call your pharmacy.   Lab work: - None ordered.  If you have labs (blood work) drawn today and your tests are completely normal, you will receive your results only by: [name] MyChart Message (if you have MyChart) OR . A paper copy in the mail If you have any lab test that is abnormal or we need to change your treatment, we will call you to review the results.  Testing/Procedures: Your physician has requested that you have an echocardiogram. Echocardiography is a painless test that uses sound waves to create images of your heart. It provides your doctor with information about the size and shape of your heart and how well your heart's chambers and valves are working. This procedure takes approximately one hour. There are no restrictions for this procedure. You may get an IV, if needed, to receive an ultrasound enhancing agent through to better visualize your heart.    Follow-Up: At [facility], you and your health needs are our priority.  As part of our continuing mission to provide you with exceptional heart care, we have created designated Provider Care Teams.  These Care Teams include your primary Cardiologist (physician) and Advanced Practice Providers (APPs -  Physician Assistants and Nurse Practitioners) who all work together to provide you with the care you need, when you need it. You will need a follow up appointment in 4-6 weeks.   You may see DR Harrell Gave END or one of the following Advanced Practice Providers on your designated Care Team:   [name], NP [name], PA-C . [name], PA-C     Echocardiogram An echocardiogram is a procedure that uses painless sound waves (ultrasound) to produce an image of the heart. Images from  an echocardiogram can provide important information about:  Signs of coronary artery disease (CAD).  Aneurysm detection. An aneurysm is a weak or damaged part of an artery wall that bulges out from the normal force of blood pumping through the body.  Heart size and shape. Changes in the size or shape of the heart can be associated with certain conditions, including heart failure, aneurysm, and CAD.  Heart muscle function.  Heart valve function.  Signs of a past heart attack.  Fluid buildup around the heart.  Thickening of the heart muscle.  A tumor or infectious growth around the heart valves. Tell a health care provider about:  Any allergies you have.  All medicines you are taking, including vitamins, herbs, eye drops, creams, and over-the-counter medicines.  Any blood disorders you have.  Any surgeries you have had.  Any medical conditions you have.  Whether you are pregnant or may be pregnant. What are the risks? Generally, this is a safe procedure. However, problems may occur, including:  Allergic reaction to dye (contrast) that may be used during the procedure. What happens before the procedure? No specific preparation is needed. You may eat and drink normally. What happens during the procedure?   An IV tube may be inserted into one of your veins.  You may receive contrast through this tube. A contrast is an injection that improves the quality of the pictures from your heart.  A gel will be applied to your chest.  A wand-like tool (transducer) will be moved over your chest. The  gel will help to transmit the sound waves from the transducer.  The sound waves will harmlessly bounce off of your heart to allow the heart images to be captured in real-time motion. The images will be recorded on a computer. The procedure may vary among health care providers and hospitals. What happens after the procedure?  You may return to your normal, everyday life, including diet,  activities, and medicines, unless your health care provider tells you not to do that. Summary  An echocardiogram is a procedure that uses painless sound waves (ultrasound) to produce an image of the heart.  Images from an echocardiogram can provide important information about the size and shape of your heart, heart muscle function, heart valve function, and fluid buildup around your heart.  You do not need to do anything to prepare before this procedure. You may eat and drink normally.  After the echocardiogram is completed, you may return to your normal, everyday life, unless your health care provider tells you not to do that. This information is not intended to replace advice given to you by your health care provider. Make sure you discuss any questions you have with your health care provider. Document Released: 03/07/2000 Document Revised: 07/01/2018 Document Reviewed: 04/12/2016 Elsevier Patient Education  2020 [employer].

## 2018-11-18 ENCOUNTER — Encounter: Payer: Self-pay | Admitting: Internal Medicine

## 2018-11-18 DIAGNOSIS — R0789 Other chest pain: Secondary | ICD-10-CM | POA: Insufficient documentation

## 2018-11-18 DIAGNOSIS — R011 Cardiac murmur, unspecified: Secondary | ICD-10-CM | POA: Insufficient documentation

## 2018-12-02 ENCOUNTER — Emergency Department
Admission: EM | Admit: 2018-12-02 | Discharge: 2018-12-02 | Disposition: A | Discharge status: Home / Self Care | Payer: Medicare HMO | Attending: Emergency Medicine | Admitting: Emergency Medicine

## 2018-12-02 ENCOUNTER — Other Ambulatory Visit: Payer: Self-pay

## 2018-12-02 ENCOUNTER — Emergency Department: Payer: Medicare HMO

## 2018-12-02 ENCOUNTER — Encounter: Payer: Self-pay | Admitting: Emergency Medicine

## 2018-12-02 DIAGNOSIS — Z79899 Other long term (current) drug therapy: Secondary | ICD-10-CM | POA: Insufficient documentation

## 2018-12-02 DIAGNOSIS — I700 Atherosclerosis of aorta: Secondary | ICD-10-CM | POA: Diagnosis not present

## 2018-12-02 DIAGNOSIS — R109 Unspecified abdominal pain: Secondary | ICD-10-CM

## 2018-12-02 DIAGNOSIS — D251 Intramural leiomyoma of uterus: Secondary | ICD-10-CM | POA: Diagnosis not present

## 2018-12-02 DIAGNOSIS — Z87891 Personal history of nicotine dependence: Secondary | ICD-10-CM | POA: Diagnosis not present

## 2018-12-02 DIAGNOSIS — I10 Essential (primary) hypertension: Secondary | ICD-10-CM | POA: Diagnosis not present

## 2018-12-02 DIAGNOSIS — R1010 Upper abdominal pain, unspecified: Secondary | ICD-10-CM | POA: Diagnosis not present

## 2018-12-02 DIAGNOSIS — R001 Bradycardia, unspecified: Secondary | ICD-10-CM | POA: Diagnosis not present

## 2018-12-02 DIAGNOSIS — Z7982 Long term (current) use of aspirin: Secondary | ICD-10-CM | POA: Diagnosis not present

## 2018-12-02 LAB — URINALYSIS, COMPLETE (UACMP) WITH MICROSCOPIC
Bilirubin Urine: NEGATIVE
Glucose, UA: NEGATIVE mg/dL
Hgb urine dipstick: NEGATIVE
Ketones, ur: NEGATIVE mg/dL
Nitrite: NEGATIVE
Protein, ur: NEGATIVE mg/dL
Specific Gravity, Urine: 1.021 (ref 1.005–1.030)
pH: 5 (ref 5.0–8.0)

## 2018-12-02 LAB — COMPREHENSIVE METABOLIC PANEL
ALT: 19 U/L (ref 0–44)
AST: 21 U/L (ref 15–41)
Albumin: 4 g/dL (ref 3.5–5.0)
Alkaline Phosphatase: 75 U/L (ref 38–126)
Anion gap: 9 (ref 5–15)
BUN: 21 mg/dL (ref 8–23)
CO2: 22 mmol/L (ref 22–32)
Calcium: 9.6 mg/dL (ref 8.9–10.3)
Chloride: 111 mmol/L (ref 98–111)
Creatinine, Ser: 0.91 mg/dL (ref 0.44–1.00)
GFR calc Af Amer: >60 mL/min (ref >60)
GFR calc non Af Amer: >60 mL/min (ref >60)
Glucose, Bld: 118 mg/dL — ABNORMAL HIGH (ref 70–99)
Potassium: 4 mmol/L (ref 3.5–5.1)
Sodium: 142 mmol/L (ref 135–145)
Total Bilirubin: 0.5 mg/dL (ref 0.3–1.2)
Total Protein: 7 g/dL (ref 6.5–8.1)

## 2018-12-02 LAB — CBC
HCT: 36.1 % (ref 36.0–46.0)
Hemoglobin: 11.9 g/dL — ABNORMAL LOW (ref 12.0–15.0)
MCH: 29.3 pg (ref 26.0–34.0)
MCHC: 33 g/dL (ref 30.0–36.0)
MCV: 88.9 fL (ref 80.0–100.0)
Platelets: 284 10*3/uL (ref 150–400)
RBC: 4.06 MIL/uL (ref 3.87–5.11)
RDW: 13.9 % (ref 11.5–15.5)
WBC: 5 10*3/uL (ref 4.0–10.5)
nRBC: 0 % (ref 0.0–0.2)

## 2018-12-02 LAB — TROPONIN I (HIGH SENSITIVITY): Troponin I (High Sensitivity): 5 ng/L (ref <18)

## 2018-12-02 LAB — LIPASE, BLOOD: Lipase: 24 U/L (ref 11–51)

## 2018-12-02 MED ORDER — DICYCLOMINE HCL 10 MG PO CAPS: 10.0000 mg | ORAL_CAPSULE | Freq: Four times a day (QID) | ORAL | 0 refills | Status: DC

## 2018-12-02 MED ORDER — SODIUM CHLORIDE 0.9% FLUSH: 3.0000 mL | Freq: Once | INTRAVENOUS | Status: DC

## 2018-12-02 MED ORDER — IOHEXOL 9 MG/ML PO SOLN
500.0000 mL | ORAL | Status: AC
  Administered 2018-12-02 (×2): 500 mL via ORAL

## 2018-12-02 MED ORDER — IOHEXOL 300 MG/ML  SOLN
100.0000 mL | Freq: Once | INTRAMUSCULAR | Status: AC | PRN
  Administered 2018-12-02: 12:00:00 100 mL via INTRAVENOUS

## 2018-12-02 NOTE — ED Notes (Signed)
CT informed that pt finished drinking contrast.

## 2018-12-02 NOTE — ED Notes (Signed)
Pt very HOH. 

## 2018-12-02 NOTE — ED Notes (Signed)
Pt provided cup of water. Informed of waiting for blood work before DC.

## 2018-12-02 NOTE — ED Notes (Signed)
Pt finished first bottle of contrast and has begun on second.

## 2018-12-02 NOTE — ED Notes (Signed)
Called lab to add on troponin  

## 2018-12-02 NOTE — ED Notes (Signed)
Pt drinking oral contrast at this time. Sat up to drink.

## 2018-12-02 NOTE — ED Notes (Signed)
Pt returned from CT °

## 2018-12-02 NOTE — Discharge Instructions (Addendum)
Please return for worse pain, fever or vomiting.  Please take the Bentyl as directed this may help.  Please follow-up with your regular doctor and return here if you not any better in 2 or 3 days.  The CT scan and blood work all looked okay.  It was a small amount of blood in the urine which will need to be followed up by your regular doctor.

## 2018-12-02 NOTE — ED Notes (Signed)
Pt came to door, had taken self off monitor. IV still in L arm. Pt stated "are y'all done with me?" Informed pt that she is not up for DC just yet. Encouraged to lay back on stretcher. Placed BP cuff and pulse ox back on pt. Pt had dressed self. Did not undress pt again.

## 2018-12-02 NOTE — ED Triage Notes (Signed)
Pt via pov from home with lower abdominal pain x 2-3 days. Pt states her bowel movements have been normal; that her urination pattern is also normal. Pt states that her "stomach is rolling." pt alert & oriented with NAD noted.

## 2018-12-02 NOTE — ED Provider Notes (Addendum)
[facility] Emergency Department Provider Note   ____________________________________________   First MD Initiated Contact with Patient 12/02/18 0930     (approximate)  I have reviewed the triage vital signs and the nursing notes.   HISTORY  Chief Complaint Abdominal Pain    HPI Natalie Knox is a 68 y.o. female who complains of 2 or 3 days of stomach pain which she reports feels like her intestines are "rolling".  She has no nausea vomiting or diarrhea.  Her stools are normal.  Her urination is normal.  Is not running a fever.  Does not appear to be coughing.         Past Medical History:  Diagnosis Date  . Allergic rhinitis   . Hyperlipidemia   . Hypertension   . Neurosyphilis     Patient Active Problem List   Diagnosis Date Noted  . Heart murmur 11/18/2018  . Chest pain of uncertain etiology [id number]  . Encounter for general adult medical examination with abnormal findings 01/04/2018  . Dysuria 01/04/2018  . Cellulitis and abscess of buttock 09/17/2017  . Constipation 08/19/2017  . Syphilis 08/19/2017  . History of neurosyphilis 07/14/2017  . Alcohol dependence with alcohol-induced psychotic disorder with complication ([place]) [id number]  . Jugular venous distension 06/20/2017  . Essential hypertension 06/20/2017  . Tobacco dependence 06/20/2017  . Deficiency of other specified B group vitamins 06/17/2017  . Metabolic encephalopathy [id number]  . Altered mental status     Past Surgical History:  Procedure Laterality Date  . UPPER GI ENDOSCOPY      Prior to Admission medications   Medication Sig Start Date End Date Taking? Authorizing Provider  amLODipine (NORVASC) 10 MG tablet Take 10 mg by mouth daily.    [provider]  aspirin EC 81 MG tablet Take 1 tablet (81 mg total) by mouth daily. 11/17/18   End, Harrell Gave, MD  carvedilol (COREG) 6.25 MG tablet Take 6.25 mg by mouth 2 (two) times daily with a meal.     [provider]  dicyclomine (BENTYL) 10 MG capsule Take 1 capsule (10 mg total) by mouth 4 (four) times daily for 14 days. 12/02/18 12/16/18  Nena Polio, MD  lisinopril (PRINIVIL,ZESTRIL) 40 MG tablet Take 40 mg by mouth daily.    [provider]  pravastatin (PRAVACHOL) 40 MG tablet Take 40 mg by mouth daily.    [provider]  vitamin B-12 (CYANOCOBALAMIN) 1000 MCG tablet Take 1,000 mcg by mouth daily.    [provider]    Allergies Patient has no known allergies.  Family History  Problem Relation Age of Onset  . Breast cancer Sister   . Heart attack Mother 47    Social History Social History   Tobacco Use  . Smoking status: Former Smoker    Packs/day: 0.50    Years: 25.00    Pack years: 12.50    Types: Cigarettes    Quit date: 05/2018    Years since quitting: 0.5  . Smokeless tobacco: Never Used  Substance Use Topics  . Alcohol use: Not Currently    Frequency: Never    Comment: no drink since march  . Drug use: No    Review of Systems  Constitutional: No fever/chills Eyes: No visual changes. ENT: No sore throat. Cardiovascular: Denies chest pain. Respiratory: Denies shortness of breath. Gastrointestinal:abdominal pain.  No nausea, no vomiting.  No diarrhea.  No constipation. Genitourinary: Negative for dysuria. Musculoskeletal: Negative for back pain. Skin: Negative  for rash. Neurological: Negative for headaches, focal weakness   ____________________________________________   PHYSICAL EXAM:  VITAL SIGNS: ED Triage Vitals  Enc Vitals Group     BP 12/02/18 0904 (!) 159/87     Pulse Rate 12/02/18 0904 60     Resp 12/02/18 0904 18     Temp 12/02/18 0904 (!) 97.5 F (36.4 C)     Temp Source 12/02/18 0904 Oral     SpO2 12/02/18 0904 98 %     Weight 12/02/18 0906 180 lb (81.6 kg)     Height 12/02/18 0906 5\' 6"  (1.676 m)     Head Circumference --      Peak Flow --      Pain Score 12/02/18 0905 0     Pain Loc --       Pain Edu? --      Excl. in [place]? --     Constitutional: Alert and oriented. Well appearing and in no acute distress. Eyes: Conjunctivae are normal.  Head: Atraumatic. Nose: No congestion/rhinnorhea. Mouth/Throat: Mucous membranes are moist.  Oropharynx non-erythematous. Neck: No stridor.  Cardiovascular: Normal rate, regular rhythm. Grossly normal heart sounds.  Good peripheral circulation. Respiratory: Normal respiratory effort.  No retractions. Lungs CTAB. Gastrointestinal: Soft minimal tenderness in the upper abdomen. No distention. No abdominal bruits. No CVA tenderness. Musculoskeletal: No lower extremity tenderness nor edema.   Neurologic:  Normal speech and language. No gross focal neurologic deficits are appreciated. No gait instability. Skin:  Skin is warm, dry and intact.    ____________________________________________   LABS (all labs ordered are listed, but only abnormal results are displayed)  Labs Reviewed  COMPREHENSIVE METABOLIC PANEL - Abnormal; Notable for the following components:      Result Value   Glucose, Bld 118 (*)    All other components within normal limits  CBC - Abnormal; Notable for the following components:   Hemoglobin 11.9 (*)    All other components within normal limits  URINALYSIS, COMPLETE (UACMP) WITH MICROSCOPIC - Abnormal; Notable for the following components:   Color, Urine YELLOW (*)    APPearance HAZY (*)    Leukocytes,Ua MODERATE (*)    Bacteria, UA RARE (*)    All other components within normal limits  LIPASE, BLOOD  HEMOGLOBIN A1C  TROPONIN I (HIGH SENSITIVITY)   ____________________________________________  EKG EKG read interpreted by me shows sinus bradycardia rate of 49 flipped T's in 1 and L which are old some minor T wave changes in V5 and V6 as well.  ____________________________________________  RADIOLOGY  ED MD interpretation: CT read by radiology reviewed by me shows no acute disease  Official radiology  report(s): Ct Abdomen Pelvis W Contrast  Result Date: 12/02/2018 CLINICAL DATA:  Acute generalized abdominal pain. EXAM: CT ABDOMEN AND PELVIS WITH CONTRAST TECHNIQUE: Multidetector CT imaging of the abdomen and pelvis was performed using the standard protocol following bolus administration of intravenous contrast. CONTRAST:  120mL OMNIPAQUE IOHEXOL 300 MG/ML  SOLN COMPARISON:  None. FINDINGS: Lower chest: Small pericardial effusion. Advanced calcific atherosclerotic disease of the coronary arteries. Tortuosity and calcific atherosclerotic disease of the aorta. Hepatobiliary: No focal liver abnormality is seen. No gallstones, gallbladder wall thickening, or biliary dilatation. Pancreas: Unremarkable. No pancreatic ductal dilatation or surrounding inflammatory changes. Spleen: Normal in size without focal abnormality. Adrenals/Urinary Tract: Normal appearance of the adrenal glands. Bilateral renal cysts. Normal appearance of the ureters and urinary bladder. Stomach/Bowel: Stomach is within normal limits. Appendix appears normal. No evidence of bowel wall thickening,  distention, or inflammatory changes. Diffuse sigmoid diverticulosis without evidence of diverticulitis. Vascular/Lymphatic: Aortic atherosclerosis. No enlarged abdominal or pelvic lymph nodes. Reproductive: Leiomyomatous uterus. No adnexal masses seen. Other: No abdominal wall hernia or abnormality. No abdominopelvic ascites. Musculoskeletal: No acute or significant osseous findings. IMPRESSION: 1. No acute abnormalities to explain patient's abdominal pain. 2. Small pericardial effusion. 3. Advanced calcific atherosclerotic disease of the coronary arteries and aorta. 4. Diffuse sigmoid diverticulosis without evidence of diverticulitis. 5. Leiomyomatous uterus. Electronically Signed   By: Fidela Salisbury M.D.   On: 12/02/2018 12:41    ____________________________________________   PROCEDURES  Procedure(s) performed (including Critical Care):   Procedures   ____________________________________________   INITIAL IMPRESSION / ASSESSMENT AND PLAN / ED COURSE  Patient CT and labs are all within normal limits except for small amount of hematuria.  Not quite sure what is making the patient's intestines feel like the rolling but everything looks okay.              ____________________________________________   FINAL CLINICAL IMPRESSION(S) / ED DIAGNOSES  Final diagnoses:  Abdominal pain, unspecified abdominal location     ED Discharge Orders         Ordered    dicyclomine (BENTYL) 10 MG capsule  4 times daily     12/02/18 1442           Note:  This document was prepared using Dragon voice recognition software and may include unintentional dictation errors.    Nena Polio, MD 12/02/18 1447    Nena Polio, MD 12/17/18 [phone]

## 2018-12-02 NOTE — ED Triage Notes (Signed)
Pt endorses nausea, denies emesis, other than "spitting."

## 2018-12-03 LAB — HEMOGLOBIN A1C
Hgb A1c MFr Bld: 5.3 % (ref 4.8–5.6)
Mean Plasma Glucose: 105 mg/dL

## 2018-12-08 DIAGNOSIS — R413 Other amnesia: Secondary | ICD-10-CM | POA: Diagnosis not present

## 2018-12-08 DIAGNOSIS — E538 Deficiency of other specified B group vitamins: Secondary | ICD-10-CM | POA: Diagnosis not present

## 2018-12-08 DIAGNOSIS — Z8661 Personal history of infections of the central nervous system: Secondary | ICD-10-CM | POA: Diagnosis not present

## 2018-12-09 ENCOUNTER — Other Ambulatory Visit: Payer: Self-pay

## 2018-12-09 ENCOUNTER — Ambulatory Visit (INDEPENDENT_AMBULATORY_CARE_PROVIDER_SITE_OTHER): Payer: Medicare HMO

## 2018-12-09 DIAGNOSIS — R011 Cardiac murmur, unspecified: Secondary | ICD-10-CM | POA: Diagnosis not present

## 2018-12-15 ENCOUNTER — Other Ambulatory Visit: Payer: Self-pay

## 2018-12-15 ENCOUNTER — Ambulatory Visit (INDEPENDENT_AMBULATORY_CARE_PROVIDER_SITE_OTHER): Payer: Medicare HMO | Admitting: Internal Medicine

## 2018-12-15 ENCOUNTER — Encounter: Payer: Self-pay | Admitting: Internal Medicine

## 2018-12-15 VITALS — BP 120/60 | HR 62 | Ht 66.0 in | Wt 183.5 lb

## 2018-12-15 DIAGNOSIS — I10 Essential (primary) hypertension: Secondary | ICD-10-CM | POA: Diagnosis not present

## 2018-12-15 DIAGNOSIS — R0789 Other chest pain: Secondary | ICD-10-CM | POA: Diagnosis not present

## 2018-12-15 DIAGNOSIS — I517 Cardiomegaly: Secondary | ICD-10-CM | POA: Diagnosis not present

## 2018-12-15 DIAGNOSIS — R9431 Abnormal electrocardiogram [ECG] [EKG]: Secondary | ICD-10-CM

## 2018-12-15 DIAGNOSIS — R079 Chest pain, unspecified: Secondary | ICD-10-CM

## 2018-12-15 NOTE — Patient Instructions (Signed)
Medication Instructions:  Your physician recommends that you continue on your current medications as directed. Please refer to the Current Medication list given to you today.  If you need a refill on your cardiac medications before your next appointment, please call your pharmacy.   Lab work: Your physician recommends that you return for lab work in: TODAY (ANA, Rheumatoid Factor, ESR, CRP).  If you have labs (blood work) drawn today and your tests are completely normal, you will receive your results only by: [name] MyChart Message (if you have MyChart) OR . A paper copy in the mail If you have any lab test that is abnormal or we need to change your treatment, we will call you to review the results.  Testing/Procedures: Your physician has requested that you have a lexiscan myoview. For further information please visit [email or web address]. Please follow instruction sheet, as given.  Natalie Knox  Your caregiver has ordered a Stress Test with nuclear imaging. The purpose of this test is to evaluate the blood supply to your heart muscle. This procedure is referred to as a "Non-Invasive Stress Test." This is because other than having an IV started in your vein, nothing is inserted or "invades" your body. Cardiac stress tests are done to find areas of poor blood flow to the heart by determining the extent of coronary artery disease (CAD). Some patients exercise on a treadmill, which naturally increases the blood flow to your heart, while others who are  unable to walk on a treadmill due to physical limitations have a pharmacologic/chemical stress agent called Lexiscan . This medicine will mimic walking on a treadmill by temporarily increasing your coronary blood flow.   Please note: these test may take anywhere between 2-4 hours to complete  PLEASE REPORT TO [facility] AT THE FIRST DESK WILL DIRECT YOU WHERE TO GO  Date of Procedure:_____________________________________   Arrival Time for Procedure:______________________________   PLEASE NOTIFY THE OFFICE AT LEAST 24 HOURS IN ADVANCE IF YOU ARE UNABLE TO KEEP YOUR APPOINTMENT.  [phone] AND  PLEASE NOTIFY NUCLEAR MEDICINE AT [facility] AT LEAST 24 HOURS IN ADVANCE IF YOU ARE UNABLE TO KEEP YOUR APPOINTMENT. [phone]  How to prepare for your Myoview test:  1. Do not eat or drink after midnight 2. No caffeine for 24 hours prior to test 3. No smoking 24 hours prior to test. 4. Your medication may be taken with water.  If your doctor stopped a medication because of this test, do not take that medication. 5. Ladies, please do not wear dresses.  Skirts or pants are appropriate. Please wear a short sleeve shirt. 6. No perfume, cologne or lotion. 7. Wear comfortable walking shoes. No heels!   Follow-Up: At [facility], you and your health needs are our priority.  As part of our continuing mission to provide you with exceptional heart care, we have created designated Provider Care Teams.  These Care Teams include your primary Cardiologist (physician) and Advanced Practice Providers (APPs -  Physician Assistants and Nurse Practitioners) who all work together to provide you with the care you need, when you need it. You will need a follow up appointment in 3 months.  You may see DR Harrell Gave END or one of the following Advanced Practice Providers on your designated Care Team:   [name], NP [name], PA-C . [name], PA-C   Cardiac Nuclear Scan A cardiac nuclear scan is a test that measures blood flow to the heart when a person  is resting and when he or she is exercising. The test looks for problems such as:  Not enough blood reaching a portion of the heart.  The heart muscle not working normally. You may need this test if:  You have heart disease.  You have had abnormal lab results.  You have had heart surgery or a balloon procedure to open up blocked arteries (angioplasty).   You have chest pain.  You have shortness of breath. In this test, a radioactive dye (tracer) is injected into your bloodstream. After the tracer has traveled to your heart, an imaging device is used to measure how much of the tracer is absorbed by or distributed to various areas of your heart. This procedure is usually done at a hospital and takes 2-4 hours. Tell a health care provider about:  Any allergies you have.  All medicines you are taking, including vitamins, herbs, eye drops, creams, and over-the-counter medicines.  Any problems you or family members have had with anesthetic medicines.  Any blood disorders you have.  Any surgeries you have had.  Any medical conditions you have.  Whether you are pregnant or may be pregnant. What are the risks? Generally, this is a safe procedure. However, problems may occur, including:  Serious chest pain and heart attack. This is only a risk if the stress portion of the test is done.  Rapid heartbeat.  Sensation of warmth in your chest. This usually passes quickly.  Allergic reaction to the tracer. What happens before the procedure?  Ask your health care provider about changing or stopping your regular medicines. This is especially important if you are taking diabetes medicines or blood thinners.  Follow instructions from your health care provider about eating or drinking restrictions.  Remove your jewelry on the day of the procedure. What happens during the procedure?  An IV will be inserted into one of your veins.  Your health care provider will inject a small amount of radioactive tracer through the IV.  You will wait for 20-40 minutes while the tracer travels through your bloodstream.  Your heart activity will be monitored with an electrocardiogram (ECG).  You will lie down on an exam table.  Images of your heart will be taken for about 15-20 minutes.  You may also have a stress test. For this test, one of the following  may be done: ? You will exercise on a treadmill or stationary bike. While you exercise, your heart's activity will be monitored with an ECG, and your blood pressure will be checked. ? You will be given medicines that will increase blood flow to parts of your heart. This is done if you are unable to exercise.  When blood flow to your heart has peaked, a tracer will again be injected through the IV.  After 20-40 minutes, you will get back on the exam table and have more images taken of your heart.  Depending on the type of tracer used, scans may need to be repeated 3-4 hours later.  Your IV line will be removed when the procedure is over. The procedure may vary among health care providers and hospitals. What happens after the procedure?  Unless your health care provider tells you otherwise, you may return to your normal schedule, including diet, activities, and medicines.  Unless your health care provider tells you otherwise, you may increase your fluid intake. This will help to flush the contrast dye from your body. Drink enough fluid to keep your urine pale yellow.  Ask  your health care provider, or the department that is doing the test: ? When will my results be ready? ? How will I get my results? Summary  A cardiac nuclear scan measures the blood flow to the heart when a person is resting and when he or she is exercising.  Tell your health care provider if you are pregnant.  Before the procedure, ask your health care provider about changing or stopping your regular medicines. This is especially important if you are taking diabetes medicines or blood thinners.  After the procedure, unless your health care provider tells you otherwise, increase your fluid intake. This will help flush the contrast dye from your body.  After the procedure, unless your health care provider tells you otherwise, you may return to your normal schedule, including diet, activities, and medicines. This  information is not intended to replace advice given to you by your health care provider. Make sure you discuss any questions you have with your health care provider. Document Released: 04/04/2004 Document Revised: 08/24/2017 Document Reviewed: 08/24/2017 Elsevier Patient Education  2020 [employer].

## 2018-12-15 NOTE — Progress Notes (Signed)
Follow-up Outpatient Visit Date: 12/15/2018  Primary Care Provider: Theotis Burrow, [place] [address]  Chief Complaint: Chest pain  HPI:  Natalie Knox is a 68 y.o. year-old female with history of hypertension, hyperlipidemia, and neurosyphilis, who presents for follow-up of hypertension.  I met her in late August, at which time she felt well.  She had been referred to [place] because of a "heart issue" though the patient denied knowing what this might be.  She endorsed only a single episode of sharp left-sided chest pain that occurred a month earlier.  Given history of tertiary syphilis, we agreed to perform an echocardiogram to evaluate for aortopathy and other structural heart abnormalities.  The study was technically challenging and notable for moderate to severe LVH with vigorous LV function.  The visualized aortic root and ascending aorta were not dilated.  A small pericardial effusion was present.  [name] is doing well today.  She notes a episodes of right-sided chest pain the last 2 nights while in bed.  Both were sharp and only lasted a second or two while lying down.  There were no associated symptoms.  She denies exertional chest pain as well as shortness of breath, palpitations, lightheadedness, and edema.  Home BP this morning was 135/76  Ms. Wanek was seen in the ED on 9/10 for abdominal pain.  She say it has been coming and going, feeling as though she needs to use the bathroom.  Workup at that time did not reveal any acute abnormalities, though small pericardial effusion and advanced coronary artery calcification were incidentally noted.  --------------------------------------------------------------------------------------------------  Cardiovascular History & Procedures: Cardiovascular Problems:  Heart murmur  Pericardial effusion  Coronary artery calcification  Risk Factors:  Hypertension, prior tobacco use, and age greater than 61   Cath/PCI:  None  CV Surgery:  None  EP Procedures and Devices:  None  Non-Invasive Evaluation(s):  TTE (12/09/2018): Technically difficult study.  Normal LV size with moderate severe LVH.  LVEF 60-65%.  Grade 1 diastolic dysfunction.  Normal live RV size and wall thickness.  Mild pulmonary hypertension.  Small pericardial effusion.  Recent CV Pertinent Labs: Lab Results  Component Value Date   CHOL 198 07/08/2018   HDL 75 07/08/2018   LDLCALC 110 (H) 07/08/2018   TRIG 66 07/08/2018   CHOLHDL 2.6 07/08/2018   K 4.0 12/02/2018   BUN 21 12/02/2018   BUN 33 (H) 07/08/2018   CREATININE 0.91 12/02/2018    Past medical and surgical history were reviewed and updated in EPIC.  Current Meds  Medication Sig  . amLODipine (NORVASC) 10 MG tablet Take 10 mg by mouth daily.  [name] aspirin EC 81 MG tablet Take 1 tablet (81 mg total) by mouth daily.  . carvedilol (COREG) 6.25 MG tablet Take 6.25 mg by mouth 2 (two) times daily with a meal.  . lisinopril (PRINIVIL,ZESTRIL) 40 MG tablet Take 40 mg by mouth daily.  . pravastatin (PRAVACHOL) 40 MG tablet Take 40 mg by mouth daily.  . vitamin B-12 (CYANOCOBALAMIN) 1000 MCG tablet Take 1,000 mcg by mouth daily.    Allergies: Patient has no known allergies.  Social History   Tobacco Use  . Smoking status: Former Smoker    Packs/day: 0.50    Years: 25.00    Pack years: 12.50    Types: Cigarettes    Quit date: 05/2018    Years since quitting: 0.5  . Smokeless tobacco: Never Used  Substance Use Topics  .  Alcohol use: Not Currently    Frequency: Never    Comment: no drink since march  . Drug use: No    Family History  Problem Relation Age of Onset  . Breast cancer Sister   . Heart attack Mother 47    Review of Systems: A 12-system review of systems was performed and was negative except as noted in the HPI.  --------------------------------------------------------------------------------------------------  Physical Exam:  BP 120/60 (BP Location: Left Arm, Patient Position: Sitting, Cuff Size: Normal)   Pulse 62   Ht 5' 6"  (1.676 m)   Wt 183 lb 8 oz (83.2 kg)   SpO2 99%   BMI 29.62 kg/m   General:  NAD. HEENT: No conjunctival pallor or scleral icterus. Moist mucous membranes.  OP clear. Neck: Supple without lymphadenopathy, thyromegaly, JVD, or HJR. Lungs: Normal work of breathing. Clear to auscultation bilaterally without wheezes or crackles. Heart: Regular rate and rhythm with 1/6 systolic murmur.  No rubs or gallops. Non-displaced PMI. Abd: Bowel sounds present. Soft, NT/ND without hepatosplenomegaly Ext: No lower extremity edema. Radial, PT, and DP pulses are 2+ bilaterally. Skin: Warm and dry without rash.  EKG:  NSR with lateral T-wave inversions and non-specific ST change as well as low voltage.  No significant change since 12/02/2018.  Lab Results  Component Value Date   WBC 5.0 12/02/2018   HGB 11.9 (L) 12/02/2018   HCT 36.1 12/02/2018   MCV 88.9 12/02/2018   PLT 284 12/02/2018    Lab Results  Component Value Date   NA 142 12/02/2018   K 4.0 12/02/2018   CL 111 12/02/2018   CO2 22 12/02/2018   BUN 21 12/02/2018   CREATININE 0.91 12/02/2018   GLUCOSE 118 (H) 12/02/2018   ALT 19 12/02/2018    Lab Results  Component Value Date   CHOL 198 07/08/2018   HDL 75 07/08/2018   LDLCALC 110 (H) 07/08/2018   TRIG 66 07/08/2018   CHOLHDL 2.6 07/08/2018    --------------------------------------------------------------------------------------------------  ASSESSMENT AND PLAN: Hypertension: BP well-controlled today.  No medication changes today.  Atypical chest pain and abnormal EKG: Chest pain the last two nights is not consistent with angina.  However, Ms. Pearman has multiple cardiac risk factors and was recently noted to have advanced coronary artery calcification on abdominal CT.  Small pericardial effusion on on CT and recent echo, which could reflect pericarditis.  No friction rub  is noted on exam today nor is the EKG suggestive of acute pericarditis.  Tracing shows persistent lateral T-wave inversions and non-specific ST segment changes.  I have recommended pharmacologic myocardial perfusion stress test, as well as labs to exclude autoimmune process underlying small effusion (ESR, CRP, ANA, and RF).  LVH: It is interesting to note that echo showed moderate to severe LVH though EKG demonstrates low voltage.  I do not believe that her small pericardial effusion explains this.  Infiltrative cardiomyopathy is a consideration and may need to be evaluated further based on results of myocardial perfusion stress test.  Follow-up: RTC 3 months.  [name], MD 12/15/2018 3:07 PM

## 2018-12-16 ENCOUNTER — Encounter: Payer: Self-pay | Admitting: Internal Medicine

## 2018-12-16 DIAGNOSIS — I10 Essential (primary) hypertension: Secondary | ICD-10-CM | POA: Diagnosis not present

## 2018-12-16 DIAGNOSIS — I517 Cardiomegaly: Secondary | ICD-10-CM | POA: Insufficient documentation

## 2018-12-16 DIAGNOSIS — R9431 Abnormal electrocardiogram [ECG] [EKG]: Secondary | ICD-10-CM | POA: Insufficient documentation

## 2018-12-16 LAB — ANA: Anti Nuclear Antibody (ANA): NEGATIVE

## 2018-12-16 LAB — SEDIMENTATION RATE: Sed Rate: 24 mm/hr (ref 0–40)

## 2018-12-16 LAB — C-REACTIVE PROTEIN: CRP: 2 mg/L (ref 0–10)

## 2018-12-16 LAB — RHEUMATOID FACTOR: Rheumatoid fact SerPl-aCnc: 11.3 IU/mL (ref 0.0–13.9)

## 2018-12-20 DIAGNOSIS — Z0000 Encounter for general adult medical examination without abnormal findings: Secondary | ICD-10-CM | POA: Diagnosis not present

## 2018-12-20 DIAGNOSIS — I10 Essential (primary) hypertension: Secondary | ICD-10-CM | POA: Diagnosis not present

## 2018-12-20 DIAGNOSIS — Z8619 Personal history of other infectious and parasitic diseases: Secondary | ICD-10-CM | POA: Diagnosis not present

## 2018-12-20 DIAGNOSIS — I517 Cardiomegaly: Secondary | ICD-10-CM | POA: Diagnosis not present

## 2018-12-28 ENCOUNTER — Encounter
Admission: RE | Admit: 2018-12-28 | Discharge: 2018-12-28 | Disposition: A | Discharge status: Home / Self Care | Payer: Medicare HMO | Source: Ambulatory Visit | Attending: Internal Medicine | Admitting: Internal Medicine

## 2018-12-28 ENCOUNTER — Other Ambulatory Visit: Payer: Self-pay

## 2018-12-28 DIAGNOSIS — R0789 Other chest pain: Secondary | ICD-10-CM | POA: Diagnosis not present

## 2018-12-28 DIAGNOSIS — R9431 Abnormal electrocardiogram [ECG] [EKG]: Secondary | ICD-10-CM | POA: Insufficient documentation

## 2018-12-28 LAB — NM MYOCAR MULTI W/SPECT W/WALL MOTION / EF
LV dias vol: 59 mL (ref 46–106)
LV sys vol: 27 mL
MPHR: 150 {beats}/min
Peak HR: 85 {beats}/min
Percent HR: 56 %
Rest HR: 54 {beats}/min
TID: 0.8

## 2018-12-28 MED ORDER — TECHNETIUM TC 99M TETROFOSMIN IV KIT
10.0000 | PACK | Freq: Once | INTRAVENOUS | Status: AC | PRN
  Administered 2018-12-28: 10.52 via INTRAVENOUS

## 2018-12-28 MED ORDER — TECHNETIUM TC 99M TETROFOSMIN IV KIT
30.0000 | PACK | Freq: Once | INTRAVENOUS | Status: AC | PRN
  Administered 2018-12-28: 31.04 via INTRAVENOUS

## 2018-12-28 MED ORDER — REGADENOSON 0.4 MG/5ML IV SOLN
0.4000 mg | Freq: Once | INTRAVENOUS | Status: AC
  Administered 2018-12-28: 09:00:00 0.4 mg via INTRAVENOUS
  Filled 2018-12-28: qty 5

## 2019-01-03 DIAGNOSIS — I10 Essential (primary) hypertension: Secondary | ICD-10-CM | POA: Diagnosis not present

## 2019-01-03 DIAGNOSIS — F17200 Nicotine dependence, unspecified, uncomplicated: Secondary | ICD-10-CM | POA: Diagnosis not present

## 2019-01-04 ENCOUNTER — Telehealth: Payer: Self-pay | Admitting: *Deleted

## 2019-01-04 NOTE — Telephone Encounter (Signed)
Called patient and gave her results of echo, lexiscan and lab work. She verbalized understanding. Went ahead and scheduled her 3 month follow up for December. She is aware of appointment date and time.

## 2019-01-11 DIAGNOSIS — A523 Neurosyphilis, unspecified: Secondary | ICD-10-CM | POA: Diagnosis not present

## 2019-01-27 DIAGNOSIS — F17200 Nicotine dependence, unspecified, uncomplicated: Secondary | ICD-10-CM | POA: Diagnosis not present

## 2019-01-27 DIAGNOSIS — I10 Essential (primary) hypertension: Secondary | ICD-10-CM | POA: Diagnosis not present

## 2019-03-22 NOTE — Progress Notes (Signed)
Follow-up Outpatient Visit Date: 03/23/2019  Primary Care Provider: Theotis Burrow, [place] [address]  Chief Complaint: Follow-up chest pain and pericardial effusion  HPI:  Natalie Knox is a 68 y.o. female with history of hypertension, hyperlipidemia, and neurosyphilis, who presents for follow-up of hypertension.  I last saw her in late September, at which time she was doing well.  She reported two episodes of right sided chest pain that occurred while lying in bed.  In both instances, the pain was sharp and only lasted a second or two.  Blood pressure was well-controlled at that time.  Given chest pain in the setting of multiple cardiac risk factors as well as small pericardial effusion on echo, myocardial perfusion stress test and inflammatory markers were recommended.  Stress test was low risk.  ESR, CRP, RF, and ANA were negative.  Today, [name] reports feeling well.  She has not had any further chest pain.  She also denies palpitations, lightheadedness, shortness of breath, and edema.  She has noticed that her blood pressure is elevated at home at times.  She reports that she is taking carvedilol 12.5 mg in the mornings rather than 6.25 mg BID.  --------------------------------------------------------------------------------------------------  Cardiovascular History & Procedures: Cardiovascular Problems:  Heart murmur  Pericardial effusion  Coronary artery calcification  Risk Factors:  Hypertension, prior tobacco use,and age greater than 36  Cath/PCI:  None  CV Surgery:  None  EP Procedures and Devices:  None  Non-Invasive Evaluation(s):  Pharmacologic MPI (12/28/2018): Normal study without ischemia or scar.  LVEF 55-65%.  Coronary artery calcification and aortic atherosclerosis noted.  TTE (12/09/2018): Technically difficult study.  Normal LV size with moderate severe LVH.  LVEF 60-65%.  Grade 1 diastolic dysfunction.   Normal live RV size and wall thickness.  Mild pulmonary hypertension.  Small pericardial effusion.  Recent CV Pertinent Labs: Lab Results  Component Value Date   CHOL 198 07/08/2018   HDL 75 07/08/2018   LDLCALC 110 (H) 07/08/2018   TRIG 66 07/08/2018   CHOLHDL 2.6 07/08/2018   K 4.0 12/02/2018   BUN 21 12/02/2018   BUN 33 (H) 07/08/2018   CREATININE 0.91 12/02/2018    Past medical and surgical history were reviewed and updated in EPIC.  Current Meds  Medication Sig  . amLODipine (NORVASC) 10 MG tablet Take 10 mg by mouth daily.  [name] aspirin EC 81 MG tablet Take 1 tablet (81 mg total) by mouth daily.  . carvedilol (COREG) 6.25 MG tablet Take 6.25 mg by mouth 2 (two) times daily with a meal.  . lisinopril (PRINIVIL,ZESTRIL) 40 MG tablet Take 40 mg by mouth daily.  . pravastatin (PRAVACHOL) 40 MG tablet Take 40 mg by mouth daily.  . vitamin B-12 (CYANOCOBALAMIN) 1000 MCG tablet Take 1,000 mcg by mouth daily.    Allergies: Patient has no known allergies.  Social History   Tobacco Use  . Smoking status: Former Smoker    Packs/day: 0.50    Years: 25.00    Pack years: 12.50    Types: Cigarettes    Quit date: 05/2018    Years since quitting: 0.8  . Smokeless tobacco: Never Used  Substance Use Topics  . Alcohol use: Not Currently    Comment: no drink since march  . Drug use: No    Family History  Problem Relation Age of Onset  . Breast cancer Sister   . Heart attack Mother 54    Review of Systems:  A 12-system review of systems was performed and was negative except as noted in the HPI.  --------------------------------------------------------------------------------------------------  Physical Exam: BP (!) 130/58 (BP Location: Left Arm, Patient Position: Sitting, Cuff Size: Normal)   Pulse 69   Ht 5' 6"  (1.676 m)   Wt 185 lb 12.8 oz (84.3 kg)   SpO2 98%   BMI 29.99 kg/m   General:  NAD. HEENT: No conjunctival pallor or scleral icterus. Facemask in  place. Neck: Supple without lymphadenopathy, thyromegaly, JVD, or HJR. Lungs: Normal work of breathing. Clear to auscultation bilaterally without wheezes or crackles. Heart: Regular rate and rhythm without murmurs, rubs, or gallops. Non-displaced PMI. Abd: Bowel sounds present. Soft, NT/ND without hepatosplenomegaly Ext: No lower extremity edema. Radial, PT, and DP pulses are 2+ bilaterally. Skin: Warm and dry without rash.  EKG:  Normal sinus rhythm with poor R wave progression, lateral T wave inversions, and low voltage.  Lab Results  Component Value Date   WBC 5.0 12/02/2018   HGB 11.9 (L) 12/02/2018   HCT 36.1 12/02/2018   MCV 88.9 12/02/2018   PLT 284 12/02/2018    Lab Results  Component Value Date   NA 142 12/02/2018   K 4.0 12/02/2018   CL 111 12/02/2018   CO2 22 12/02/2018   BUN 21 12/02/2018   CREATININE 0.91 12/02/2018   GLUCOSE 118 (H) 12/02/2018   ALT 19 12/02/2018    Lab Results  Component Value Date   CHOL 198 07/08/2018   HDL 75 07/08/2018   LDLCALC 110 (H) 07/08/2018   TRIG 66 07/08/2018   CHOLHDL 2.6 07/08/2018    --------------------------------------------------------------------------------------------------  ASSESSMENT AND PLAN: Hypertension: BP reasonable today but intermittently high at home.  I have recommended that she take carvedilol 6.25 mg BID rather than 12.5 mg QAM.  No other medication changes at this time.  LVH: No signs or symptoms of heart failure.  EKG today again shoes low voltage in spite of moderat eto severe LVH on echo in September.  I suspect lateral T-wave changes are due to abnormal repolarization, given non-ischemic MPI.  I think it would be useful to perform a cardiac MRI for further evaluation.  History of neurosyphilis: Thus far, no evidence of cardiac involvement.  Patient has completed antibiotics per [facility] ID.  Follow-up: Return to clinic in 6 months.  [name], MD 03/24/2019 10:48 AM

## 2019-03-23 ENCOUNTER — Ambulatory Visit (INDEPENDENT_AMBULATORY_CARE_PROVIDER_SITE_OTHER): Payer: Medicare HMO | Admitting: Internal Medicine

## 2019-03-23 ENCOUNTER — Encounter: Payer: Self-pay | Admitting: Internal Medicine

## 2019-03-23 ENCOUNTER — Other Ambulatory Visit: Payer: Self-pay

## 2019-03-23 VITALS — BP 130/58 | HR 69 | Ht 66.0 in | Wt 185.8 lb

## 2019-03-23 DIAGNOSIS — Z8619 Personal history of other infectious and parasitic diseases: Secondary | ICD-10-CM

## 2019-03-23 DIAGNOSIS — R011 Cardiac murmur, unspecified: Secondary | ICD-10-CM | POA: Diagnosis not present

## 2019-03-23 DIAGNOSIS — I10 Essential (primary) hypertension: Secondary | ICD-10-CM | POA: Diagnosis not present

## 2019-03-23 DIAGNOSIS — Z8661 Personal history of infections of the central nervous system: Secondary | ICD-10-CM

## 2019-03-23 DIAGNOSIS — I517 Cardiomegaly: Secondary | ICD-10-CM

## 2019-03-23 NOTE — Patient Instructions (Signed)
Medication Instructions:  Change your Carvedilol to 6.25mg  twice daily.  *If you need a refill on your cardiac medications before your next appointment, please call your pharmacy*  Lab Work: None ordered  If you have labs (blood work) drawn today and your tests are completely normal, you will receive your results only by: [name] MyChart Message (if you have MyChart) OR . A paper copy in the mail If you have any lab test that is abnormal or we need to change your treatment, we will call you to review the results.  Testing/Procedures: None ordered  Follow-Up: At [facility], you and your health needs are our priority.  As part of our continuing mission to provide you with exceptional heart care, we have created designated Provider Care Teams.  These Care Teams include your primary Cardiologist (physician) and Advanced Practice Providers (APPs -  Physician Assistants and Nurse Practitioners) who all work together to provide you with the care you need, when you need it.  Your next appointment:   6 months  The format for your next appointment:   In person  Provider:    You may see Dr. Harrell Gave End or one of the following Advanced Practice Providers on your designated Care Team:    [name], NP  [name], PA-C  [name], PA-C   Other Instructions none

## 2019-03-24 ENCOUNTER — Encounter: Payer: Self-pay | Admitting: Internal Medicine

## 2019-04-19 DIAGNOSIS — F17200 Nicotine dependence, unspecified, uncomplicated: Secondary | ICD-10-CM | POA: Diagnosis not present

## 2019-04-19 DIAGNOSIS — I10 Essential (primary) hypertension: Secondary | ICD-10-CM | POA: Diagnosis not present

## 2019-04-20 DIAGNOSIS — Z23 Encounter for immunization: Secondary | ICD-10-CM | POA: Diagnosis not present

## 2019-05-18 ENCOUNTER — Other Ambulatory Visit: Payer: Self-pay | Admitting: *Deleted

## 2019-05-18 MED ORDER — ASPIRIN EC 81 MG PO TBEC: 81.0000 mg | DELAYED_RELEASE_TABLET | Freq: Every day | ORAL | 1 refills | Status: DC

## 2019-05-18 NOTE — Telephone Encounter (Signed)
Requested Prescriptions   Signed Prescriptions Disp Refills  . aspirin EC 81 MG tablet 90 tablet 1    Sig: Take 1 tablet (81 mg total) by mouth daily.    Authorizing Provider: END, CHRISTOPHER    Ordering User: Britt Bottom

## 2019-05-24 DIAGNOSIS — H35033 Hypertensive retinopathy, bilateral: Secondary | ICD-10-CM | POA: Diagnosis not present

## 2019-05-24 DIAGNOSIS — H18413 Arcus senilis, bilateral: Secondary | ICD-10-CM | POA: Diagnosis not present

## 2019-05-24 DIAGNOSIS — Z135 Encounter for screening for eye and ear disorders: Secondary | ICD-10-CM | POA: Diagnosis not present

## 2019-05-24 DIAGNOSIS — Z0100 Encounter for examination of eyes and vision without abnormal findings: Secondary | ICD-10-CM | POA: Diagnosis not present

## 2019-05-24 DIAGNOSIS — H524 Presbyopia: Secondary | ICD-10-CM | POA: Diagnosis not present

## 2019-05-24 DIAGNOSIS — E7800 Pure hypercholesterolemia, unspecified: Secondary | ICD-10-CM | POA: Diagnosis not present

## 2019-05-24 DIAGNOSIS — I10 Essential (primary) hypertension: Secondary | ICD-10-CM | POA: Diagnosis not present

## 2019-05-24 DIAGNOSIS — H25813 Combined forms of age-related cataract, bilateral: Secondary | ICD-10-CM | POA: Diagnosis not present

## 2019-05-26 ENCOUNTER — Other Ambulatory Visit: Payer: Self-pay

## 2019-05-26 MED ORDER — ASPIRIN EC 81 MG PO TBEC: 81.0000 mg | DELAYED_RELEASE_TABLET | Freq: Every day | ORAL | 1 refills | Status: DC

## 2019-06-16 DIAGNOSIS — F17200 Nicotine dependence, unspecified, uncomplicated: Secondary | ICD-10-CM | POA: Diagnosis not present

## 2019-06-16 DIAGNOSIS — I10 Essential (primary) hypertension: Secondary | ICD-10-CM | POA: Diagnosis not present

## 2019-06-23 DIAGNOSIS — Z1331 Encounter for screening for depression: Secondary | ICD-10-CM | POA: Diagnosis not present

## 2019-06-23 DIAGNOSIS — Z87891 Personal history of nicotine dependence: Secondary | ICD-10-CM | POA: Diagnosis not present

## 2019-06-23 DIAGNOSIS — Z79899 Other long term (current) drug therapy: Secondary | ICD-10-CM | POA: Diagnosis not present

## 2019-06-23 DIAGNOSIS — E7800 Pure hypercholesterolemia, unspecified: Secondary | ICD-10-CM | POA: Diagnosis not present

## 2019-06-23 DIAGNOSIS — Z0000 Encounter for general adult medical examination without abnormal findings: Secondary | ICD-10-CM | POA: Diagnosis not present

## 2019-06-23 DIAGNOSIS — Z974 Presence of external hearing-aid: Secondary | ICD-10-CM | POA: Diagnosis not present

## 2019-06-23 DIAGNOSIS — R739 Hyperglycemia, unspecified: Secondary | ICD-10-CM | POA: Diagnosis not present

## 2019-07-04 DIAGNOSIS — R413 Other amnesia: Secondary | ICD-10-CM | POA: Diagnosis not present

## 2019-07-04 DIAGNOSIS — Z8661 Personal history of infections of the central nervous system: Secondary | ICD-10-CM | POA: Diagnosis not present

## 2019-07-04 DIAGNOSIS — E538 Deficiency of other specified B group vitamins: Secondary | ICD-10-CM | POA: Diagnosis not present

## 2019-07-04 DIAGNOSIS — R4182 Altered mental status, unspecified: Secondary | ICD-10-CM | POA: Diagnosis not present

## 2019-07-12 DIAGNOSIS — Z8619 Personal history of other infectious and parasitic diseases: Secondary | ICD-10-CM | POA: Diagnosis not present

## 2019-08-18 DIAGNOSIS — I10 Essential (primary) hypertension: Secondary | ICD-10-CM | POA: Diagnosis not present

## 2019-08-18 DIAGNOSIS — F17200 Nicotine dependence, unspecified, uncomplicated: Secondary | ICD-10-CM | POA: Diagnosis not present

## 2019-08-23 DIAGNOSIS — I10 Essential (primary) hypertension: Secondary | ICD-10-CM | POA: Diagnosis not present

## 2019-08-23 DIAGNOSIS — F17200 Nicotine dependence, unspecified, uncomplicated: Secondary | ICD-10-CM | POA: Diagnosis not present

## 2019-09-12 ENCOUNTER — Other Ambulatory Visit: Payer: Self-pay | Admitting: Family Medicine

## 2019-09-12 DIAGNOSIS — Z1231 Encounter for screening mammogram for malignant neoplasm of breast: Secondary | ICD-10-CM

## 2019-09-21 ENCOUNTER — Other Ambulatory Visit: Payer: Self-pay

## 2019-09-21 ENCOUNTER — Encounter: Payer: Self-pay | Admitting: Internal Medicine

## 2019-09-21 ENCOUNTER — Ambulatory Visit (INDEPENDENT_AMBULATORY_CARE_PROVIDER_SITE_OTHER): Payer: Medicare HMO | Admitting: Internal Medicine

## 2019-09-21 VITALS — BP 148/70 | HR 67 | Ht 66.0 in | Wt 185.1 lb

## 2019-09-21 DIAGNOSIS — R0989 Other specified symptoms and signs involving the circulatory and respiratory systems: Secondary | ICD-10-CM | POA: Diagnosis not present

## 2019-09-21 DIAGNOSIS — R9431 Abnormal electrocardiogram [ECG] [EKG]: Secondary | ICD-10-CM | POA: Diagnosis not present

## 2019-09-21 DIAGNOSIS — I10 Essential (primary) hypertension: Secondary | ICD-10-CM | POA: Diagnosis not present

## 2019-09-21 MED ORDER — HYDROCHLOROTHIAZIDE 12.5 MG PO CAPS
12.5000 mg | ORAL_CAPSULE | Freq: Every day | ORAL | 0 refills | Status: DC
Start: 2019-09-21 — End: 2020-02-08

## 2019-09-21 MED ORDER — HYDROCHLOROTHIAZIDE 12.5 MG PO CAPS
12.5000 mg | ORAL_CAPSULE | Freq: Every day | ORAL | 1 refills | Status: DC
Start: 2019-09-21 — End: 2019-12-22

## 2019-09-21 NOTE — Patient Instructions (Signed)
Medication Instructions:  Your physician has recommended you make the following change in your medication:  1- START Hydrochlorothiazide 12.5 mg by mouth once a day.  *If you need a refill on your cardiac medications before your next appointment, please call your pharmacy*   Lab Work: Your physician recommends that you return for lab work in: 1 month for BMET at [facility]. - Please go to the [facility]. You will check in at the front desk to the right as you walk into the atrium. Valet Parking is offered if needed. - No appointment needed. You may go any day between 7 am and 6 pm.  If you have labs (blood work) drawn today and your tests are completely normal, you will receive your results only by: [name] MyChart Message (if you have MyChart) OR . A paper copy in the mail If you have any lab test that is abnormal or we need to change your treatment, we will call you to review the results.   Testing/Procedures: Your physician has requested that you have a carotid duplex. This test is an ultrasound of the carotid arteries in your neck. It looks at blood flow through these arteries that supply the brain with blood. Allow one hour for this exam. There are no restrictions or special instructions. Can wait 1 month so that lab work can be done on the same way. Lab work needs to be in 1 month.  Follow-Up: At [facility], you and your health needs are our priority.  As part of our continuing mission to provide you with exceptional heart care, we have created designated Provider Care Teams.  These Care Teams include your primary Cardiologist (physician) and Advanced Practice Providers (APPs -  Physician Assistants and Nurse Practitioners) who all work together to provide you with the care you need, when you need it.  We recommend signing up for the patient portal called "MyChart".  Sign up information is provided on this After Visit Summary.  MyChart is used to connect with patients for  Virtual Visits (Telemedicine).  Patients are able to view lab/test results, encounter notes, upcoming appointments, etc.  Non-urgent messages can be sent to your provider as well.   To learn more about what you can do with MyChart, go to [email or web address].    Your next appointment:   4 month(s)  The format for your next appointment:   In Person  Provider:    You may see DR Harrell Gave END or one of the following Advanced Practice Providers on your designated Care Team:    [name], NP  [name], PA-C  [name], PA-C

## 2019-09-21 NOTE — Progress Notes (Signed)
Follow-up Outpatient Visit Date: 09/21/2019  Primary Care Provider: Derinda Late, MD [phone] [facility] [place] [address]  Chief Complaint: Hypertension  HPI:  Natalie Knox is a 69 y.o. female with history of hypertension, hyperlipidemia, and neurosyphilis, who presents for follow-up of hypertension.  I last saw Natalie Knox in 02/2019, at which time she was feeling well.  She has been taking carvedilol 12.5 mg every morning rather than 6.25 mg twice daily.  I suggested that she try twice daily dosing for more sustained effect.  Otherwise did not make any medication changes or pursue additional testing.  Today, Natalie Knox reports she has been feeling well, denying chest pain, shortness of breath, palpitations, lightheadedness, and edema.  She notes that her blood pressure is typically upper normal to mildly elevated, with systolic readings from 035 to the upper 140s.  She reports being compliant with her medications.  She has been trying to limit her sodium intake.  --------------------------------------------------------------------------------------------------  Cardiovascular History & Procedures: Cardiovascular Problems:  Heart murmur  Pericardial effusion  Coronary artery calcification  Risk Factors:  Hypertension, prior tobacco use,and age greater than 41  Cath/PCI:  None  CV Surgery:  None  EP Procedures and Devices:  None  Non-Invasive Evaluation(s):  Pharmacologic MPI (12/28/2018): Normal study without ischemia or scar.  LVEF 55-65%.  Coronary artery calcification and aortic atherosclerosis noted.  TTE (12/09/2018): Technically difficult study. Normal LV size with moderate-severe LVH. LVEF 60-65%. Grade 1 diastolic dysfunction. Normal live RV size and wall thickness. Mild pulmonary hypertension. Small pericardial effusion.  Recent CV Pertinent Labs: Lab Results  Component Value Date   CHOL 198  07/08/2018   HDL 75 07/08/2018   LDLCALC 110 (H) 07/08/2018   TRIG 66 07/08/2018   CHOLHDL 2.6 07/08/2018   K 4.0 12/02/2018   BUN 21 12/02/2018   BUN 33 (H) 07/08/2018   CREATININE 0.91 12/02/2018    Past medical and surgical history were reviewed and updated in EPIC.  Current Meds  Medication Sig  . amLODipine (NORVASC) 10 MG tablet Take 10 mg by mouth daily.  [name] aspirin EC 81 MG tablet Take 1 tablet (81 mg total) by mouth daily.  . carvedilol (COREG) 6.25 MG tablet Take 6.25 mg by mouth 2 (two) times daily with a meal.  . lisinopril (PRINIVIL,ZESTRIL) 40 MG tablet Take 40 mg by mouth daily.  . pravastatin (PRAVACHOL) 40 MG tablet Take 40 mg by mouth daily.  . vitamin B-12 (CYANOCOBALAMIN) 1000 MCG tablet Take 1,000 mcg by mouth daily.    Allergies: Patient has no known allergies.  Social History   Tobacco Use  . Smoking status: Former Smoker    Packs/day: 0.50    Years: 25.00    Pack years: 12.50    Types: Cigarettes    Quit date: 05/2018    Years since quitting: 1.3  . Smokeless tobacco: Never Used  Vaping Use  . Vaping Use: Never used  Substance Use Topics  . Alcohol use: Not Currently    Comment: no drink since march  . Drug use: No    Family History  Problem Relation Age of Onset  . Breast cancer Sister   . Heart attack Mother 17    Review of Systems: A 12-system review of systems was performed and was negative except as noted in the HPI.  --------------------------------------------------------------------------------------------------  Physical Exam: BP (!) 148/70 (BP Location: Left Arm, Patient Position: Sitting, Cuff Size: Normal)  Pulse 67   Ht 5\' 6"  (1.676 m)   Wt 185 lb 2 oz (84 kg)   SpO2 99%   BMI 29.88 kg/m   General: NAD. Neck: No JVD or HJR.  Bilateral carotid bruits noted. Heart: Regular rate and rhythm with 2/6 systolic murmur. Lungs: Clear to auscultation without wheezes or crackles. Abdomen: Soft, nontender,  nondistended. Extremities: No lower extremity edema.  EKG: Normal sinus rhythm with PACs, low voltage, and lateral T wave inversions.  Lab Results  Component Value Date   WBC 5.0 12/02/2018   HGB 11.9 (L) 12/02/2018   HCT 36.1 12/02/2018   MCV 88.9 12/02/2018   PLT 284 12/02/2018    Lab Results  Component Value Date   NA 142 12/02/2018   K 4.0 12/02/2018   CL 111 12/02/2018   CO2 22 12/02/2018   BUN 21 12/02/2018   CREATININE 0.91 12/02/2018   GLUCOSE 118 (H) 12/02/2018   ALT 19 12/02/2018    Lab Results  Component Value Date   CHOL 198 07/08/2018   HDL 75 07/08/2018   LDLCALC 110 (H) 07/08/2018   TRIG 66 07/08/2018   CHOLHDL 2.6 07/08/2018    --------------------------------------------------------------------------------------------------  ASSESSMENT AND PLAN: Hypertension: Blood pressure mildly elevated today, with home readings also upper normal to mildly elevated.  I have recommended adding HCTZ 12.5 mg daily to her current regimen of amlodipine, carvedilol, and lisinopril.  Given hyperdynamic left ventricle, we will need to be judicious with preload reduction.  We will plan to repeat a basic metabolic panel in about a month to ensure stable renal function and electrolytes.  Carotid bruit: Bilateral carotid bruits noted on exam today, though these could reflect radiation from cardiac murmur.  We will obtain carotid Dopplers at the patient's convenience.  Abnormal EKG: EKG shows persistent  lateral T wave inversions and low voltage, unchanged from prior tracings.  Echocardiogram in 11/2018 showed normal LVEF with significant LVH, likely contributing to her repolarization abnormalities.  It is notable that she has low voltage on EKG, which could suggest an infiltrative process.  Given that she is asymptomatic, we will defer further intervention at this time, though cardiac MRI should be considered in the future if there is evidence of heart failure as the patient would  be at risk for restrictive cardiomyopathy.  Follow-up: Return to clinic in 4 months.  [name], MD 09/21/2019 10:16 AM

## 2019-09-22 NOTE — Addendum Note (Signed)
Addended by: Britt Bottom on: 09/22/2019 11:42 AM   Modules accepted: Orders

## 2019-10-13 ENCOUNTER — Other Ambulatory Visit: Payer: Self-pay | Admitting: Internal Medicine

## 2019-10-19 ENCOUNTER — Emergency Department
Admission: EM | Admit: 2019-10-19 | Discharge: 2019-10-19 | Disposition: A | Discharge status: Home / Self Care | Payer: Medicare PPO | Attending: Emergency Medicine | Admitting: Emergency Medicine

## 2019-10-19 ENCOUNTER — Other Ambulatory Visit: Payer: Self-pay

## 2019-10-19 DIAGNOSIS — Z87891 Personal history of nicotine dependence: Secondary | ICD-10-CM | POA: Diagnosis not present

## 2019-10-19 DIAGNOSIS — G5603 Carpal tunnel syndrome, bilateral upper limbs: Secondary | ICD-10-CM | POA: Diagnosis not present

## 2019-10-19 DIAGNOSIS — I10 Essential (primary) hypertension: Secondary | ICD-10-CM | POA: Diagnosis not present

## 2019-10-19 DIAGNOSIS — R202 Paresthesia of skin: Secondary | ICD-10-CM | POA: Diagnosis present

## 2019-10-19 DIAGNOSIS — Z7982 Long term (current) use of aspirin: Secondary | ICD-10-CM | POA: Insufficient documentation

## 2019-10-19 DIAGNOSIS — Z79899 Other long term (current) drug therapy: Secondary | ICD-10-CM | POA: Insufficient documentation

## 2019-10-19 MED ORDER — MELOXICAM 7.5 MG PO TABS
7.5000 mg | ORAL_TABLET | Freq: Every day | ORAL | 0 refills | Status: DC
Start: 2019-10-19 — End: 2019-10-19

## 2019-10-19 MED ORDER — MELOXICAM 7.5 MG PO TABS
7.5000 mg | ORAL_TABLET | Freq: Every day | ORAL | 0 refills | Status: DC
Start: 2019-10-19 — End: 2019-12-22

## 2019-10-19 NOTE — ED Notes (Signed)
Says numbness in bilat hands today at work.  Better now. 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

## 2019-10-19 NOTE — ED Provider Notes (Signed)
[facility] Emergency Department Provider Note  ____________________________________________  Time seen: Approximately 4:11 PM  I have reviewed the triage vital signs and the nursing notes.   HISTORY  Chief Complaint Hand Pain    HPI Natalie ELOWYN Knox is a 69 y.o. female who presents to department complaining of period of numbness and tingling of her hands.  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  Patient states that she also tries to lift light weights and regular basis to stay fit.  Patient states that today her hand became numb, tingling.  Was not sure whether this had anything to do with the fact that she has to going to a walk-in freezer all the time versus some other condition.  No history of same.  Patient states that symptoms resolved on her own.  No direct trauma to the hands.  No history of Raynaud's.  No history of carpal tunnel.         Past Medical History:  Diagnosis Date  . Allergic rhinitis   . Hyperlipidemia   . Hypertension   . Neurosyphilis     Patient Active Problem List   Diagnosis Date Noted  . Abnormal EKG 12/16/2018  . LVH (left ventricular hypertrophy) 12/16/2018  . Heart murmur 11/18/2018  . Atypical chest pain 11/18/2018  . Encounter for general adult medical examination with abnormal findings 01/04/2018  . Dysuria 01/04/2018  . Cellulitis and abscess of buttock 09/17/2017  . Constipation 08/19/2017  . Syphilis 08/19/2017  . History of neurosyphilis 07/14/2017  . Alcohol dependence with alcohol-induced psychotic disorder with complication ([place]) 26/37/8588  . Bilateral carotid bruits 06/20/2017  . Essential hypertension 06/20/2017  . Tobacco dependence 06/20/2017  . Deficiency of other specified B group vitamins 06/17/2017  . Metabolic encephalopathy 50/27/7412  . Altered mental status     Past Surgical History:  Procedure Laterality Date  . UPPER GI ENDOSCOPY      Prior to  Admission medications   Medication Sig Start Date End Date Taking? Authorizing Provider  amLODipine (NORVASC) 10 MG tablet Take 10 mg by mouth daily.   Yes [provider]  aspirin EC 81 MG tablet Take 1 tablet (81 mg total) by mouth daily. 05/26/19  Yes End, Harrell Gave, MD  carvedilol (COREG) 6.25 MG tablet Take 6.25 mg by mouth 2 (two) times daily with a meal.   Yes [provider]  hydrochlorothiazide (MICROZIDE) 12.5 MG capsule Take 1 capsule (12.5 mg total) by mouth daily. 09/21/19 12/20/19 Yes End, Harrell Gave, MD  lisinopril (PRINIVIL,ZESTRIL) 40 MG tablet Take 40 mg by mouth daily.   Yes [provider]  pravastatin (PRAVACHOL) 40 MG tablet Take 40 mg by mouth daily.   Yes [provider]  vitamin B-12 (CYANOCOBALAMIN) 1000 MCG tablet Take 1,000 mcg by mouth daily.   Yes [provider]  hydrochlorothiazide (MICROZIDE) 12.5 MG capsule Take 1 capsule (12.5 mg total) by mouth daily. 09/21/19 12/20/19  End, Harrell Gave, MD  meloxicam (MOBIC) 7.5 MG tablet Take 1 tablet (7.5 mg total) by mouth daily. 10/19/19 10/18/20  Ronnel Zuercher, Charline Bills, PA-C    Allergies Patient has no known allergies.  Family History  Problem Relation Age of Onset  . Breast cancer Sister   . Heart attack Mother 71    Social History Social History   Tobacco Use  . Smoking status: Former Smoker    Packs/day: 0.50    Years: 25.00    Pack years: 12.50  Types: Cigarettes    Quit date: 05/2018    Years since quitting: 1.4  . Smokeless tobacco: Never Used  Vaping Use  . Vaping Use: Never used  Substance Use Topics  . Alcohol use: Not Currently    Comment: no drink since march  . Drug use: No     Review of Systems  Constitutional: No fever/chills Eyes: No visual changes. No discharge ENT: No upper respiratory complaints. Cardiovascular: no chest pain. Respiratory: no cough. No SOB. Gastrointestinal: No abdominal pain.  No nausea, no vomiting.  No diarrhea.  No  constipation. Musculoskeletal: Bilateral hand numbness and tingling slightly worse in the right Skin: Negative for rash, abrasions, lacerations, ecchymosis. Neurological: Negative for headaches, focal weakness or numbness. 10-point ROS otherwise negative.  ____________________________________________   PHYSICAL EXAM:  VITAL SIGNS: ED Triage Vitals  Enc Vitals Group     BP 10/19/19 1336 (!) 134/46     Pulse Rate 10/19/19 1336 67     Resp 10/19/19 1336 18     Temp 10/19/19 1336 97.6 F (36.4 C)     Temp Source 10/19/19 1336 Oral     SpO2 10/19/19 1336 100 %     Weight 10/19/19 1342 180 lb (81.6 kg)     Height 10/19/19 1342 5\' 6"  (1.676 m)     Head Circumference --      Peak Flow --      Pain Score 10/19/19 1342 0     Pain Loc --      Pain Edu? --      Excl. in [place]? --      Constitutional: Alert and oriented. Well appearing and in no acute distress. Eyes: Conjunctivae are normal. PERRL. EOMI. Head: Atraumatic. ENT:      Ears:       Nose: No congestion/rhinnorhea.      Mouth/Throat: Mucous membranes are moist.  Neck: No stridor. No cervical spine tenderness to palpation.  Cardiovascular: Normal rate, regular rhythm. Normal S1 and S2.  Good peripheral circulation. Respiratory: Normal respiratory effort without tachypnea or retractions. Lungs CTAB. Good air entry to the bases with no decreased or absent breath sounds. Musculoskeletal: Full range of motion to all extremities. No gross deformities appreciated.  Visualization of bilateral hands reveals no visible abnormality.  Good range of motion to bilateral wrist, all digits right and left hand.  Hands are warm to touch.  No pallor.  Patient with mildly positive Tinel's sign on the right but negative on the left.  Positive for Phalen's.  Equal grip strength bilaterally.  Good approximation.  Sensation intact all digits bilaterally. Neurologic:  Normal speech and language. No gross focal neurologic deficits are appreciated.  Skin:   Skin is warm, dry and intact. No rash noted. Psychiatric: Mood and affect are normal. Speech and behavior are normal. Patient exhibits appropriate insight and judgement.   ____________________________________________   LABS (all labs ordered are listed, but only abnormal results are displayed)  Labs Reviewed - No data to display ____________________________________________  EKG   ____________________________________________  RADIOLOGY   No results found.  ____________________________________________    PROCEDURES  Procedure(s) performed:    Procedures    Medications - No data to display   ____________________________________________   INITIAL IMPRESSION / ASSESSMENT AND PLAN / ED COURSE  Pertinent labs & imaging results that were available during my care of the patient were reviewed by me and considered in my medical decision making (see chart for details).  Review of the [place] CSRS was  performed in accordance of the [place] prior to dispensing any controlled drugs.           Patient's diagnosis is consistent with carpal tunnel syndrome.  Patient presented to the emergency department complaining of bilateral hand numbness and tingling, worse on the right than left.  No history of same.  No history of Raynaud's.  Patient does repetitive motions at work.  She had mildly positive Tinel's on the right, positive Phalen's.  Feel that symptoms are likely carpal tunnel.  Differential included Raynaud's, reaction to cold, vascular occlusion.  No indication for labs or imaging at this time.  Patient replaced on a low-dose anti-inflammatory medication and advised to avoid repetitive especially repetitive weightbearing exercise to the wrist.  Follow-up with orthopedics as needed.. Patient is given ED precautions to return to the ED for any worsening or new symptoms.     ____________________________________________  FINAL CLINICAL IMPRESSION(S) / ED DIAGNOSES  Final  diagnoses:  Bilateral carpal tunnel syndrome      NEW MEDICATIONS STARTED DURING THIS VISIT:  ED Discharge Orders         Ordered    meloxicam (MOBIC) 7.5 MG tablet  Daily     Discontinue  Reprint     10/19/19 1633              This chart was dictated using voice recognition software/Dragon. Despite best efforts to proofread, errors can occur which can change the meaning. Any change was purely unintentional.    Darletta Moll, PA-C 10/19/19 1638    Vladimir Crofts, MD 10/19/19 2251

## 2019-10-19 NOTE — ED Triage Notes (Signed)
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 Denies injury.

## 2019-10-20 ENCOUNTER — Ambulatory Visit
Admission: RE | Admit: 2019-10-20 | Discharge: 2019-10-20 | Disposition: A | Discharge status: Home / Self Care | Payer: Medicare PPO | Source: Ambulatory Visit | Attending: Family Medicine | Admitting: Family Medicine

## 2019-10-20 DIAGNOSIS — Z1231 Encounter for screening mammogram for malignant neoplasm of breast: Secondary | ICD-10-CM | POA: Insufficient documentation

## 2019-10-27 ENCOUNTER — Telehealth: Payer: Self-pay | Admitting: Internal Medicine

## 2019-10-27 ENCOUNTER — Ambulatory Visit (INDEPENDENT_AMBULATORY_CARE_PROVIDER_SITE_OTHER): Payer: Medicare PPO

## 2019-10-27 ENCOUNTER — Other Ambulatory Visit: Payer: Self-pay

## 2019-10-27 DIAGNOSIS — R0989 Other specified symptoms and signs involving the circulatory and respiratory systems: Secondary | ICD-10-CM | POA: Diagnosis not present

## 2019-10-27 NOTE — Telephone Encounter (Signed)
Spoke with the patient. Patient sts that she has stopped taking HCTZ > 2 weeks. Patient sts that she took the medication for 30 days and then stopped. Patient reports having several dizzy spells that caused her to miss work. This has subsided with discontinuing HCTZ. She says she is taking her other medications as prescribed. Patient sts that she has been checking her BP regularly and it has been ok. I asked her what her readings were and she reported 130's/20's. Adv her that number is not possible.  Adv the patient that I will fwd the update to Dr. Saunders Revel. I have asked her to ck her BP daily for the next 5-7 days and to call back with the BP reading so that Dr. Saunders Revel can be updated. Her granddaughter is a CNA and can help her with daily BP checks.

## 2019-10-27 NOTE — Telephone Encounter (Signed)
I spoke with Ms. Gorum regarding her lightheadedness, which has resolved with discontinuation of HCTZ.  She has not checked her BP since stopping the medication earlier this week.  I have advised her to remain of HCTZ and to check her blood pressure periodically.  She should contact us if it is consistently above 140/90.  We also reviewed the prelim results of her carotid Doppler study, which showed mild bilateral carotid artery stenosis.  We will continue with aspirin and pravastatin.  We also discussed proceeding with cardiac MRI, given low voltage on EKG in the setting of LVH and concern for bilateral carpal tunnel syndrome during recent ED visit.  Patient does not have transportation to [place] at this time, as cardiac MRI is not currently performed in [place].  I have advised her to reach out to her family/friends to see if someone could assist with transportation.  This should be readdressed when she is seen for f/u in October.  [name], MD [facility]

## 2019-10-27 NOTE — Telephone Encounter (Signed)
Pt. Has a question about her medication she said that it makes her very dizzy. She is requesting a phone call to see if there is something that can be done to resolve the issue

## 2019-12-19 DIAGNOSIS — Z79899 Other long term (current) drug therapy: Secondary | ICD-10-CM | POA: Diagnosis not present

## 2019-12-19 DIAGNOSIS — R739 Hyperglycemia, unspecified: Secondary | ICD-10-CM | POA: Diagnosis not present

## 2019-12-19 DIAGNOSIS — E7800 Pure hypercholesterolemia, unspecified: Secondary | ICD-10-CM | POA: Diagnosis not present

## 2019-12-20 ENCOUNTER — Inpatient Hospital Stay
Admission: EM | Admit: 2019-12-20 | Discharge: 2019-12-22 | DRG: 812 | Disposition: A | Discharge status: Home / Self Care | Payer: Medicare HMO | Attending: Internal Medicine | Admitting: Internal Medicine

## 2019-12-20 ENCOUNTER — Other Ambulatory Visit: Payer: Self-pay

## 2019-12-20 DIAGNOSIS — D649 Anemia, unspecified: Secondary | ICD-10-CM | POA: Diagnosis not present

## 2019-12-20 DIAGNOSIS — D62 Acute posthemorrhagic anemia: Principal | ICD-10-CM | POA: Diagnosis present

## 2019-12-20 DIAGNOSIS — N1831 Chronic kidney disease, stage 3a: Secondary | ICD-10-CM | POA: Diagnosis present

## 2019-12-20 DIAGNOSIS — K5730 Diverticulosis of large intestine without perforation or abscess without bleeding: Secondary | ICD-10-CM | POA: Diagnosis present

## 2019-12-20 DIAGNOSIS — K3189 Other diseases of stomach and duodenum: Secondary | ICD-10-CM

## 2019-12-20 DIAGNOSIS — E785 Hyperlipidemia, unspecified: Secondary | ICD-10-CM | POA: Diagnosis present

## 2019-12-20 DIAGNOSIS — I10 Essential (primary) hypertension: Secondary | ICD-10-CM | POA: Diagnosis not present

## 2019-12-20 DIAGNOSIS — Z8619 Personal history of other infectious and parasitic diseases: Secondary | ICD-10-CM

## 2019-12-20 DIAGNOSIS — R413 Other amnesia: Secondary | ICD-10-CM | POA: Diagnosis present

## 2019-12-20 DIAGNOSIS — Z803 Family history of malignant neoplasm of breast: Secondary | ICD-10-CM

## 2019-12-20 DIAGNOSIS — Z87891 Personal history of nicotine dependence: Secondary | ICD-10-CM

## 2019-12-20 DIAGNOSIS — D509 Iron deficiency anemia, unspecified: Secondary | ICD-10-CM | POA: Diagnosis present

## 2019-12-20 DIAGNOSIS — Z79899 Other long term (current) drug therapy: Secondary | ICD-10-CM

## 2019-12-20 DIAGNOSIS — I129 Hypertensive chronic kidney disease with stage 1 through stage 4 chronic kidney disease, or unspecified chronic kidney disease: Secondary | ICD-10-CM | POA: Diagnosis present

## 2019-12-20 DIAGNOSIS — R9431 Abnormal electrocardiogram [ECG] [EKG]: Secondary | ICD-10-CM | POA: Diagnosis present

## 2019-12-20 DIAGNOSIS — N179 Acute kidney failure, unspecified: Secondary | ICD-10-CM | POA: Diagnosis present

## 2019-12-20 DIAGNOSIS — Z7982 Long term (current) use of aspirin: Secondary | ICD-10-CM

## 2019-12-20 DIAGNOSIS — Z791 Long term (current) use of non-steroidal anti-inflammatories (NSAID): Secondary | ICD-10-CM

## 2019-12-20 DIAGNOSIS — K635 Polyp of colon: Secondary | ICD-10-CM | POA: Diagnosis present

## 2019-12-20 DIAGNOSIS — R1012 Left upper quadrant pain: Secondary | ICD-10-CM | POA: Diagnosis present

## 2019-12-20 DIAGNOSIS — Z8249 Family history of ischemic heart disease and other diseases of the circulatory system: Secondary | ICD-10-CM

## 2019-12-20 DIAGNOSIS — Z20822 Contact with and (suspected) exposure to covid-19: Secondary | ICD-10-CM | POA: Diagnosis present

## 2019-12-20 LAB — BASIC METABOLIC PANEL
Anion gap: 8 (ref 5–15)
BUN: 20 mg/dL (ref 8–23)
CO2: 22 mmol/L (ref 22–32)
Calcium: 8.7 mg/dL — ABNORMAL LOW (ref 8.9–10.3)
Chloride: 111 mmol/L (ref 98–111)
Creatinine, Ser: 1.1 mg/dL — ABNORMAL HIGH (ref 0.44–1.00)
GFR calc Af Amer: 58 mL/min — ABNORMAL LOW (ref >60)
GFR calc non Af Amer: 50 mL/min — ABNORMAL LOW (ref >60)
Glucose, Bld: 106 mg/dL — ABNORMAL HIGH (ref 70–99)
Potassium: 4 mmol/L (ref 3.5–5.1)
Sodium: 141 mmol/L (ref 135–145)

## 2019-12-20 LAB — CBC
HCT: 22.3 % — ABNORMAL LOW (ref 36.0–46.0)
Hemoglobin: 6.5 g/dL — ABNORMAL LOW (ref 12.0–15.0)
MCH: 22.7 pg — ABNORMAL LOW (ref 26.0–34.0)
MCHC: 29.1 g/dL — ABNORMAL LOW (ref 30.0–36.0)
MCV: 78 fL — ABNORMAL LOW (ref 80.0–100.0)
Platelets: 384 10*3/uL (ref 150–400)
RBC: 2.86 MIL/uL — ABNORMAL LOW (ref 3.87–5.11)
RDW: 18.4 % — ABNORMAL HIGH (ref 11.5–15.5)
WBC: 4.7 10*3/uL (ref 4.0–10.5)
nRBC: 0 % (ref 0.0–0.2)

## 2019-12-20 LAB — RESPIRATORY PANEL BY RT PCR (FLU A&B, COVID)
Influenza A by PCR: NEGATIVE
Influenza B by PCR: NEGATIVE
SARS Coronavirus 2 by RT PCR: NEGATIVE

## 2019-12-20 LAB — PREPARE RBC (CROSSMATCH)

## 2019-12-20 LAB — IRON AND TIBC
Iron: 23 ug/dL — ABNORMAL LOW (ref 28–170)
Saturation Ratios: 6 % — ABNORMAL LOW (ref 10.4–31.8)
TIBC: 410 ug/dL (ref 250–450)
UIBC: 387 ug/dL

## 2019-12-20 LAB — ABO/RH: ABO/RH(D): O POS

## 2019-12-20 LAB — VITAMIN B12: Vitamin B-12: 1139 pg/mL — ABNORMAL HIGH (ref 180–914)

## 2019-12-20 MED ORDER — SODIUM CHLORIDE 0.9% IV SOLUTION
Freq: Once | INTRAVENOUS | Status: AC
  Filled 2019-12-20: qty 250

## 2019-12-20 MED ORDER — PRAVASTATIN SODIUM 20 MG PO TABS
40.0000 mg | ORAL_TABLET | Freq: Every day | ORAL | Status: DC
  Administered 2019-12-20 – 2019-12-21 (×2): 40 mg via ORAL
  Filled 2019-12-20: qty 2
  Filled 2019-12-20 (×2): qty 1

## 2019-12-20 MED ORDER — PANTOPRAZOLE SODIUM 40 MG IV SOLR
40.0000 mg | Freq: Two times a day (BID) | INTRAVENOUS | Status: DC
  Administered 2019-12-20 – 2019-12-22 (×4): 40 mg via INTRAVENOUS
  Filled 2019-12-20 (×4): qty 40

## 2019-12-20 MED ORDER — VITAMIN B-12 1000 MCG PO TABS
1000.0000 ug | ORAL_TABLET | Freq: Every day | ORAL | Status: DC
  Administered 2019-12-20 – 2019-12-21 (×2): 1000 ug via ORAL
  Filled 2019-12-20 (×3): qty 1

## 2019-12-20 MED ORDER — CARVEDILOL 6.25 MG PO TABS
6.2500 mg | ORAL_TABLET | Freq: Two times a day (BID) | ORAL | Status: DC
  Administered 2019-12-20 – 2019-12-22 (×4): 6.25 mg via ORAL
  Filled 2019-12-20 (×4): qty 1

## 2019-12-20 NOTE — ED Triage Notes (Addendum)
Pt comes via POV from PCP with c/o low hgb. daughter reports pt was there for check up and lab work. daughter states she was advised to bring pt here for admittance and evaluation of loosing blood.  Unsure if pt has had any bleeding in urine or stool.  Pt is at baseline per daughter.  Results show hgb of 6.5 taken at PCP yesterday.

## 2019-12-20 NOTE — ED Notes (Signed)
Patient daughter at bedside.

## 2019-12-20 NOTE — ED Provider Notes (Signed)
[facility] Emergency Department Provider Note   ____________________________________________   First MD Initiated Contact with Patient 12/20/19 1540     (approximate)  I have reviewed the triage vital signs and the nursing notes.   HISTORY  Chief Complaint Low Hgb   HPI Natalie Knox is a 69 y.o. female who comes from the doctor's office.  She went for routine blood work and was found to be anemic with a hemoglobin of 6.  Patient is at her usual state of health possibly slightly weak.  Does not remember having any black tarry stools blood in the stools or blood in the urine.  I did go over risk factors for transfusion and she has consented to transfusion         Past Medical History:  Diagnosis Date  . Allergic rhinitis   . Hyperlipidemia   . Hypertension   . Neurosyphilis     Patient Active Problem List   Diagnosis Date Noted  . Abnormal EKG 12/16/2018  . LVH (left ventricular hypertrophy) 12/16/2018  . Heart murmur 11/18/2018  . Atypical chest pain 11/18/2018  . Encounter for general adult medical examination with abnormal findings 01/04/2018  . Dysuria 01/04/2018  . Cellulitis and abscess of buttock 09/17/2017  . Constipation 08/19/2017  . Syphilis 08/19/2017  . History of neurosyphilis 07/14/2017  . Alcohol dependence with alcohol-induced psychotic disorder with complication ([place]) 77/82/4235  . Bilateral carotid bruits 06/20/2017  . Essential hypertension 06/20/2017  . Tobacco dependence 06/20/2017  . Deficiency of other specified B group vitamins 06/17/2017  . Metabolic encephalopathy 36/14/4315  . Altered mental status     Past Surgical History:  Procedure Laterality Date  . UPPER GI ENDOSCOPY      Prior to Admission medications   Medication Sig Start Date End Date Taking? Authorizing Provider  amLODipine (NORVASC) 10 MG tablet Take 10 mg by mouth daily.    [provider]  aspirin EC 81 MG tablet Take 1 tablet  (81 mg total) by mouth daily. 05/26/19   End, Harrell Gave, MD  carvedilol (COREG) 6.25 MG tablet Take 6.25 mg by mouth 2 (two) times daily with a meal.    [provider]  hydrochlorothiazide (MICROZIDE) 12.5 MG capsule Take 1 capsule (12.5 mg total) by mouth daily. 09/21/19 12/20/19  End, Harrell Gave, MD  hydrochlorothiazide (MICROZIDE) 12.5 MG capsule Take 1 capsule (12.5 mg total) by mouth daily. 09/21/19 12/20/19  End, Harrell Gave, MD  lisinopril (PRINIVIL,ZESTRIL) 40 MG tablet Take 40 mg by mouth daily.    [provider]  meloxicam (MOBIC) 7.5 MG tablet Take 1 tablet (7.5 mg total) by mouth daily. 10/19/19 10/18/20  Lannie Fields, PA-C  pravastatin (PRAVACHOL) 40 MG tablet Take 40 mg by mouth daily.    [provider]  vitamin B-12 (CYANOCOBALAMIN) 1000 MCG tablet Take 1,000 mcg by mouth daily.    [provider]    Allergies Patient has no known allergies.  Family History  Problem Relation Age of Onset  . Breast cancer Sister   . Heart attack Mother 51    Social History Social History   Tobacco Use  . Smoking status: Former Smoker    Packs/day: 0.50    Years: 25.00    Pack years: 12.50    Types: Cigarettes    Quit date: 05/2018    Years since quitting: 1.5  . Smokeless tobacco: Never Used  Vaping Use  . Vaping Use: Never used  Substance Use Topics  .  Alcohol use: Not Currently    Comment: no drink since march  . Drug use: No    Review of Systems  Constitutional: No fever/chills Eyes: No visual changes. ENT: No sore throat. Cardiovascular: Denies chest pain. Respiratory: Denies shortness of breath. Gastrointestinal: No abdominal pain.  No nausea, no vomiting.  No diarrhea.  No constipation. Genitourinary: Negative for dysuria. Musculoskeletal: Negative for back pain. Skin: Negative for rash. Neurological: Negative for headaches, focal weakness   ____________________________________________   PHYSICAL EXAM:  VITAL SIGNS: ED  Triage Vitals [12/20/19 1515]  Enc Vitals Group     BP (!) 149/62     Pulse Rate 65     Resp 18     Temp 98.9 F (37.2 C)     Temp src      SpO2 99 %     Weight 170 lb (77.1 kg)     Height 5\' 4"  (1.626 m)     Head Circumference      Peak Flow      Pain Score 0     Pain Loc      Pain Edu?      Excl. in [place]?     Constitutional: Alert and oriented. Well appearing and in no acute distress. Eyes: Conjunctivae are normal. PER Head: Atraumatic. Nose: No congestion/rhinnorhea. Mouth/Throat: Mucous membranes are moist.  Oropharynx non-erythematous. Neck: No stridor. Cardiovascular: Normal rate, regular rhythm. Grossly normal heart sounds.  Good peripheral circulation. Respiratory: Normal respiratory effort.  No retractions. Lungs CTAB. Gastrointestinal: Soft and nontender. No distention. No abdominal bruits. No CVA tenderness. Musculoskeletal: No lower extremity tenderness nor edema.   Neurologic:  Normal speech and language. No gross focal neurologic deficits are appreciated.  Skin:  Skin is warm, dry and intact.   ____________________________________________   LABS (all labs ordered are listed, but only abnormal results are displayed)  Labs Reviewed  CBC - Abnormal; Notable for the following components:      Result Value   RBC 2.86 (*)    Hemoglobin 6.5 (*)    HCT 22.3 (*)    MCV 78.0 (*)    MCH 22.7 (*)    MCHC 29.1 (*)    RDW 18.4 (*)    All other components within normal limits  BASIC METABOLIC PANEL  VITAMIN P10  FOLATE RBC  IRON AND TIBC  TYPE AND SCREEN   ____________________________________________  EKG   ____________________________________________  RADIOLOGY  ED MD interpretation:    Official radiology report(s): No results found.  ____________________________________________   PROCEDURES  Procedure(s) performed (including Critical Care):  Procedures   ____________________________________________   INITIAL IMPRESSION / ASSESSMENT AND  PLAN / ED COURSE  Patient with marked anemia.  No obvious source for her bleeding.  Currently do not have a room to put her into the rectal exam.  She has consented to transfusion.  We will plan on admitting her for transfusion and further work-up.  I will send iron studies B12 and folate.  She does have a history of B vitamin deficiency.              ____________________________________________   FINAL CLINICAL IMPRESSION(S) / ED DIAGNOSES  Final diagnoses:  Symptomatic anemia     ED Discharge Orders    None      *Please note:  Eriyonna LAASIA ARCOS was evaluated in Emergency Department on 12/20/2019 for the symptoms described in the history of present illness. She was evaluated in the context of the global COVID-19 pandemic, which necessitated  consideration that the patient might be at risk for infection with the SARS-CoV-2 virus that causes COVID-19. Institutional protocols and algorithms that pertain to the evaluation of patients at risk for COVID-19 are in a state of rapid change based on information released by regulatory bodies including the CDC and federal and state organizations. These policies and algorithms were followed during the patient's care in the ED.  Some ED evaluations and interventions may be delayed as a result of limited staffing during and the pandemic.*   Note:  This document was prepared using Dragon voice recognition software and may include unintentional dictation errors.    Nena Polio, MD 12/20/19 1544

## 2019-12-20 NOTE — H&P (Addendum)
Natalie Knox is an 69 y.o. female.   Patient came from home, at the baseline, she is independent of all ADLs. Chief Complaint: Anemia. HPI:  Natalie Knox is a 69 year old female with history of hypertension, dyslipidemia who present to the hospital with anemia.  She went to her family doctor's office, was found to have hemoglobin of 6.  She was sent to the emergency room for work-up. Patient states that she has been diagnosed with neurosyphilis about a year ago, she has completed antibiotics.  Since that time, she always has some abdominal pain.  Localized in the left upper stomach, intermittent, seems to be worse with eating.  Severity is moderate, no radiation.  She has no nausea vomiting.   She normally has normal bowel movements, but for the last 2 days, her stool has been black.  She also feels dizzy when she stands up or walking.  She did not have any short of breath or cough.  ED course.  Patient afebrile, blood pressure 149/62.  Hemoglobin 6.5, white cell 4.7, platelets 384.  Patient with thiamine screened, pending transfusion.  Past Medical History:  Diagnosis Date  . Allergic rhinitis   . Hyperlipidemia   . Hypertension   . Neurosyphilis     Past Surgical History:  Procedure Laterality Date  . UPPER GI ENDOSCOPY      Family History  Problem Relation Age of Onset  . Breast cancer Sister   . Heart attack Mother 53   Social History:  reports that she quit smoking about 18 months ago. Her smoking use included cigarettes. She has a 12.50 pack-year smoking history. She has never used smokeless tobacco. She reports previous alcohol use. She reports that she does not use drugs.  Allergies: No Known Allergies  (Not in a hospital admission)   Results for orders placed or performed during the hospital encounter of 12/20/19 (from the past 48 hour(s))  CBC     Status: Abnormal   Collection Time: 12/20/19  3:20 PM  Result Value Ref Range   WBC 4.7 4.0 - 10.5 K/uL   RBC  2.86 (L) 3.87 - 5.11 MIL/uL   Hemoglobin 6.5 (L) 12.0 - 15.0 g/dL   HCT 22.3 (L) 36 - 46 %   MCV 78.0 (L) 80.0 - 100.0 fL   MCH 22.7 (L) 26.0 - 34.0 pg   MCHC 29.1 (L) 30.0 - 36.0 g/dL   RDW 18.4 (H) 11.5 - 15.5 %   Platelets 384 150 - 400 K/uL   nRBC 0.0 0.0 - 0.2 %    Comment: Performed at [facility], [address], [place], [place] [address]  Basic metabolic panel     Status: Abnormal   Collection Time: 12/20/19  3:20 PM  Result Value Ref Range   Sodium 141 135 - 145 mmol/L   Potassium 4.0 3.5 - 5.1 mmol/L   Chloride 111 98 - 111 mmol/L   CO2 22 22 - 32 mmol/L   Glucose, Bld 106 (H) 70 - 99 mg/dL    Comment: Glucose reference range applies only to samples taken after fasting for at least 8 hours.   BUN 20 8 - 23 mg/dL   Creatinine, Ser 1.10 (H) 0.44 - 1.00 mg/dL   Calcium 8.7 (L) 8.9 - 10.3 mg/dL   GFR calc non Af Amer 50 (L) >60 mL/min   GFR calc Af Amer 58 (L) >60 mL/min   Anion gap 8 5 - 15    Comment: Performed at  [facility], [address] [place], [place] [address]  Type and screen [place]     Status: None   Collection Time: 12/20/19  3:20 PM  Result Value Ref Range   ABO/RH(D) O POS    Antibody Screen NEG    Sample Expiration      12/23/2019,2359 Performed at [facility], [address] [place], [place] [address]    No results found.  Review of Systems  Constitutional: Negative for activity change, diaphoresis, fatigue and fever.  HENT: Negative for congestion, postnasal drip and rhinorrhea.   Eyes: Negative for pain and discharge.  Respiratory: Negative for cough, choking and shortness of breath.   Cardiovascular: Negative for chest pain, palpitations and leg swelling.  Gastrointestinal: Positive for abdominal pain and blood in stool. Negative for abdominal distention, constipation, diarrhea, nausea and vomiting.  Endocrine: Negative for cold intolerance, heat intolerance and polydipsia.   Genitourinary: Negative for dysuria, frequency and hematuria.  Musculoskeletal: Negative for back pain, joint swelling and myalgias.  Skin: Positive for pallor. Negative for rash and wound.  Neurological: Positive for dizziness. Negative for seizures, numbness and headaches.  Psychiatric/Behavioral: Negative for confusion and hallucinations.    Blood pressure (!) 149/62, pulse 65, temperature 98.9 F (37.2 C), resp. rate 18, height 5\' 4"  (1.626 m), weight 77.1 kg, SpO2 99 %. Physical Exam Constitutional:      General: She is not in acute distress.    Appearance: She is normal weight. She is not ill-appearing or toxic-appearing.  HENT:     Head: Normocephalic and atraumatic.     Nose: Nose normal. No congestion or rhinorrhea.     Mouth/Throat:     Mouth: Mucous membranes are dry.     Pharynx: Oropharynx is clear. No oropharyngeal exudate.  Eyes:     General: No scleral icterus.    Conjunctiva/sclera: Conjunctivae normal.     Pupils: Pupils are equal, round, and reactive to light.  Cardiovascular:     Rate and Rhythm: Normal rate and regular rhythm.     Heart sounds: No murmur heard.  No gallop.   Pulmonary:     Effort: Pulmonary effort is normal.     Breath sounds: Normal breath sounds. No wheezing or rales.  Abdominal:     General: Abdomen is flat. Bowel sounds are normal. There is no distension.     Palpations: Abdomen is soft.     Tenderness: There is no abdominal tenderness.  Musculoskeletal:        General: Normal range of motion.     Cervical back: Normal range of motion and neck supple. No rigidity or tenderness.     Right lower leg: No edema.     Left lower leg: No edema.  Lymphadenopathy:     Cervical: No cervical adenopathy.  Skin:    General: Skin is warm and dry.     Coloration: Skin is not jaundiced.  Neurological:     General: No focal deficit present.     Mental Status: She is alert and oriented to person, place, and time.     Cranial Nerves: No cranial  nerve deficit.     Sensory: No sensory deficit.  Psychiatric:        Mood and Affect: Mood normal.        Thought Content: Thought content normal.      Assessment/Plan #1.  Acute on chronic anemia. Spoke with patient daughter, patient baseline hemoglobin was 10, recently dropped down to  6.0.  In the emergency room, hemoglobin was 6.5. Patient was chronically taking B12, will check iron level, repeat a B12 level.  We will give B12 injection if a B12 level still low. Patient states that he had some black stools, with abdominal pain, he may have a gastric ulcer.  Start Protonix IV twice a day.  Consult GI. Monitor hemoglobin every 6 hours.  Transfuse if needed.  2.  Essential hypertension. Hold off lisinopril and hydrochlorothiazide in case patient blood pressure drops with active bleeding.  3.  Acute kidney injury secondary to GI bleed. Reviewed previous chart, patient renal function was normal on 12/02/2018.   4.  Prophylaxis. We will use SCDs  We will place patient on observation status anticipating patient would not need to 2 days to stay.   Consult: called Dr. Geraldo Docker, advised to keep patient n.p.o. overnight, gave Protonix IV twice a day.  She will see the patient tomorrow morning.  Sharen Hones, MD 12/20/2019, 5:36 PM

## 2019-12-21 ENCOUNTER — Observation Stay: Payer: Medicare HMO | Admitting: Anesthesiology

## 2019-12-21 ENCOUNTER — Encounter
Admission: EM | Disposition: A | Discharge status: Home / Self Care | Payer: Self-pay | Source: Home / Self Care | Attending: Internal Medicine

## 2019-12-21 ENCOUNTER — Encounter: Payer: Self-pay | Admitting: Internal Medicine

## 2019-12-21 DIAGNOSIS — K3189 Other diseases of stomach and duodenum: Secondary | ICD-10-CM | POA: Diagnosis not present

## 2019-12-21 DIAGNOSIS — D649 Anemia, unspecified: Secondary | ICD-10-CM

## 2019-12-21 DIAGNOSIS — D123 Benign neoplasm of transverse colon: Secondary | ICD-10-CM | POA: Diagnosis not present

## 2019-12-21 DIAGNOSIS — K921 Melena: Secondary | ICD-10-CM | POA: Diagnosis not present

## 2019-12-21 DIAGNOSIS — R413 Other amnesia: Secondary | ICD-10-CM | POA: Diagnosis not present

## 2019-12-21 DIAGNOSIS — K5790 Diverticulosis of intestine, part unspecified, without perforation or abscess without bleeding: Secondary | ICD-10-CM | POA: Diagnosis not present

## 2019-12-21 DIAGNOSIS — D62 Acute posthemorrhagic anemia: Secondary | ICD-10-CM | POA: Diagnosis present

## 2019-12-21 DIAGNOSIS — D509 Iron deficiency anemia, unspecified: Secondary | ICD-10-CM | POA: Diagnosis not present

## 2019-12-21 DIAGNOSIS — Z8249 Family history of ischemic heart disease and other diseases of the circulatory system: Secondary | ICD-10-CM | POA: Diagnosis not present

## 2019-12-21 DIAGNOSIS — E785 Hyperlipidemia, unspecified: Secondary | ICD-10-CM | POA: Diagnosis not present

## 2019-12-21 DIAGNOSIS — Z20822 Contact with and (suspected) exposure to covid-19: Secondary | ICD-10-CM | POA: Diagnosis not present

## 2019-12-21 DIAGNOSIS — Z791 Long term (current) use of non-steroidal anti-inflammatories (NSAID): Secondary | ICD-10-CM | POA: Diagnosis not present

## 2019-12-21 DIAGNOSIS — I129 Hypertensive chronic kidney disease with stage 1 through stage 4 chronic kidney disease, or unspecified chronic kidney disease: Secondary | ICD-10-CM | POA: Diagnosis not present

## 2019-12-21 DIAGNOSIS — Z7982 Long term (current) use of aspirin: Secondary | ICD-10-CM | POA: Diagnosis not present

## 2019-12-21 DIAGNOSIS — N1831 Chronic kidney disease, stage 3a: Secondary | ICD-10-CM

## 2019-12-21 DIAGNOSIS — K5730 Diverticulosis of large intestine without perforation or abscess without bleeding: Secondary | ICD-10-CM | POA: Diagnosis present

## 2019-12-21 DIAGNOSIS — R9431 Abnormal electrocardiogram [ECG] [EKG]: Secondary | ICD-10-CM | POA: Diagnosis present

## 2019-12-21 DIAGNOSIS — K635 Polyp of colon: Secondary | ICD-10-CM | POA: Diagnosis not present

## 2019-12-21 DIAGNOSIS — R1012 Left upper quadrant pain: Secondary | ICD-10-CM | POA: Diagnosis not present

## 2019-12-21 DIAGNOSIS — N179 Acute kidney failure, unspecified: Secondary | ICD-10-CM | POA: Diagnosis not present

## 2019-12-21 DIAGNOSIS — Z79899 Other long term (current) drug therapy: Secondary | ICD-10-CM | POA: Diagnosis not present

## 2019-12-21 DIAGNOSIS — Z8619 Personal history of other infectious and parasitic diseases: Secondary | ICD-10-CM | POA: Diagnosis not present

## 2019-12-21 DIAGNOSIS — I10 Essential (primary) hypertension: Secondary | ICD-10-CM

## 2019-12-21 DIAGNOSIS — Z87891 Personal history of nicotine dependence: Secondary | ICD-10-CM | POA: Diagnosis not present

## 2019-12-21 DIAGNOSIS — Z803 Family history of malignant neoplasm of breast: Secondary | ICD-10-CM | POA: Diagnosis not present

## 2019-12-21 HISTORY — PX: ESOPHAGOGASTRODUODENOSCOPY: SHX5428

## 2019-12-21 LAB — TYPE AND SCREEN
ABO/RH(D): O POS
Antibody Screen: NEGATIVE
Unit division: 0

## 2019-12-21 LAB — BASIC METABOLIC PANEL
Anion gap: 8 (ref 5–15)
BUN: 18 mg/dL (ref 8–23)
CO2: 22 mmol/L (ref 22–32)
Calcium: 8.7 mg/dL — ABNORMAL LOW (ref 8.9–10.3)
Chloride: 111 mmol/L (ref 98–111)
Creatinine, Ser: 1.12 mg/dL — ABNORMAL HIGH (ref 0.44–1.00)
GFR calc Af Amer: 57 mL/min — ABNORMAL LOW (ref >60)
GFR calc non Af Amer: 49 mL/min — ABNORMAL LOW (ref >60)
Glucose, Bld: 96 mg/dL (ref 70–99)
Potassium: 3.9 mmol/L (ref 3.5–5.1)
Sodium: 141 mmol/L (ref 135–145)

## 2019-12-21 LAB — CBC
HCT: 24.6 % — ABNORMAL LOW (ref 36.0–46.0)
Hemoglobin: 7.7 g/dL — ABNORMAL LOW (ref 12.0–15.0)
MCH: 23.5 pg — ABNORMAL LOW (ref 26.0–34.0)
MCHC: 31.3 g/dL (ref 30.0–36.0)
MCV: 75.2 fL — ABNORMAL LOW (ref 80.0–100.0)
Platelets: 315 10*3/uL (ref 150–400)
RBC: 3.27 MIL/uL — ABNORMAL LOW (ref 3.87–5.11)
RDW: 18.1 % — ABNORMAL HIGH (ref 11.5–15.5)
WBC: 5.5 10*3/uL (ref 4.0–10.5)
nRBC: 0 % (ref 0.0–0.2)

## 2019-12-21 LAB — FERRITIN: Ferritin: 7 ng/mL — ABNORMAL LOW (ref 11–307)

## 2019-12-21 LAB — HEMOGLOBIN: Hemoglobin: 9.2 g/dL — ABNORMAL LOW (ref 12.0–15.0)

## 2019-12-21 LAB — BPAM RBC
Blood Product Expiration Date: 202110282359
ISSUE DATE / TIME: 202109282034
Unit Type and Rh: 5100

## 2019-12-21 SURGERY — EGD (ESOPHAGOGASTRODUODENOSCOPY)
Anesthesia: General

## 2019-12-21 MED ORDER — SODIUM CHLORIDE 0.9 % IV SOLN
400.0000 mg | Freq: Once | INTRAVENOUS | Status: AC
  Administered 2019-12-21: 400 mg via INTRAVENOUS
  Filled 2019-12-21: qty 20

## 2019-12-21 MED ORDER — PEG 3350-KCL-NA BICARB-NACL 420 G PO SOLR
4000.0000 mL | Freq: Once | ORAL | Status: AC
  Administered 2019-12-21: 4000 mL via ORAL
  Filled 2019-12-21 (×2): qty 4000

## 2019-12-21 MED ORDER — PROPOFOL 10 MG/ML IV BOLUS
INTRAVENOUS | Status: DC | PRN
  Administered 2019-12-21 (×2): 20 mg via INTRAVENOUS
  Administered 2019-12-21: 50 mg via INTRAVENOUS

## 2019-12-21 MED ORDER — SODIUM CHLORIDE 0.9 % IV SOLN: INTRAVENOUS | Status: DC

## 2019-12-21 MED ORDER — PROPOFOL 10 MG/ML IV BOLUS
INTRAVENOUS | Status: AC
  Filled 2019-12-21: qty 20

## 2019-12-21 MED ORDER — BISACODYL 5 MG PO TBEC
10.0000 mg | DELAYED_RELEASE_TABLET | Freq: Once | ORAL | Status: AC
  Administered 2019-12-21: 10 mg via ORAL
  Filled 2019-12-21 (×2): qty 2

## 2019-12-21 NOTE — Anesthesia Postprocedure Evaluation (Signed)
Anesthesia Post Note  Patient: Natalie Knox  Procedure(s) Performed: ESOPHAGOGASTRODUODENOSCOPY (EGD) (N/A )  Patient location during evaluation: Endoscopy Anesthesia Type: General Level of consciousness: awake and alert and oriented Pain management: pain level controlled Vital Signs Assessment: post-procedure vital signs reviewed and stable Respiratory status: spontaneous breathing Cardiovascular status: blood pressure returned to baseline Anesthetic complications: no   No complications documented.   Last Vitals:  Vitals:   12/21/19 1416 12/21/19 1426  BP: (!) 188/59 (!) 105/94  Pulse: 60 (!) 59  Resp: 20 (!) 24  Temp:    SpO2: 98% 100%    Last Pain:  Vitals:   12/21/19 1336  TempSrc:   PainSc: 0-No pain                 [name]

## 2019-12-21 NOTE — Progress Notes (Signed)
Patient ID: Natalie Knox, female   DOB: 1951/02/16, 69 y.o.   MRN: [id number] [facility] PROGRESS NOTE  Natalie Knox:[id number] DOB: Nov 18, 1950 DOA: 12/20/2019 PCP: Derinda Late, MD  HPI/Subjective: Patient states that she had some black stools for couple days.  No complaints of chest pain or shortness of breath.  Had a unit of blood on the hemoglobin of 6.5.  Objective: Vitals:   12/21/19 1416 12/21/19 1426  BP: (!) 188/59 (!) 105/94  Pulse: 60 (!) 59  Resp: 20 (!) 24  Temp:    SpO2: 98% 100%    Intake/Output Summary (Last 24 hours) at 12/21/2019 1622 Last data filed at 12/20/2019 2105 Gross per 24 hour  Intake 800 ml  Output --  Net 800 ml   Filed Weights   12/20/19 1515 12/21/19 1210  Weight: 77.1 kg 86.2 kg    ROS: Review of Systems  Respiratory: Negative for cough and shortness of breath.   Cardiovascular: Negative for chest pain.  Gastrointestinal: Negative for abdominal pain, nausea and vomiting.   Exam: Physical Exam HENT:     Head: Normocephalic.     Nose: No mucosal edema.     Mouth/Throat:     Pharynx: No oropharyngeal exudate.  Eyes:     General: Lids are normal.     Conjunctiva/sclera: Conjunctivae normal.     Pupils: Pupils are equal, round, and reactive to light.  Cardiovascular:     Rate and Rhythm: Normal rate and regular rhythm.     Heart sounds: S1 normal and S2 normal. Murmur heard.  Systolic murmur is present with a grade of 2/6.   Pulmonary:     Breath sounds: Examination of the right-lower field reveals decreased breath sounds. Examination of the left-lower field reveals decreased breath sounds. Decreased breath sounds present. No wheezing, rhonchi or rales.  Abdominal:     Palpations: Abdomen is soft.     Tenderness: There is no abdominal tenderness.  Musculoskeletal:     Right lower leg: No swelling.     Left lower leg: No swelling.  Skin:    General: Skin is warm.     Findings: No rash.  Neurological:     Mental  Status: She is alert and oriented to person, place, and time.       Data Reviewed: Basic Metabolic Panel: Recent Labs  Lab 12/20/19 1520 12/21/19 0423  NA 141 141  K 4.0 3.9  CL 111 111  CO2 22 22  GLUCOSE 106* 96  BUN 20 18  CREATININE 1.10* 1.12*  CALCIUM 8.7* 8.7*   CBC: Recent Labs  Lab 12/20/19 1520 12/21/19 0423  WBC 4.7 5.5  HGB 6.5* 7.7*  HCT 22.3* 24.6*  MCV 78.0* 75.2*  PLT 384 315     Recent Results (from the past 240 hour(s))  Respiratory Panel by RT PCR (Flu A&B, Covid) - Nasopharyngeal Swab     Status: None   Collection Time: 12/20/19  4:55 PM   Specimen: Nasopharyngeal Swab  Result Value Ref Range Status   SARS Coronavirus 2 by RT PCR NEGATIVE NEGATIVE Final    Comment: (NOTE) SARS-CoV-2 target nucleic acids are NOT DETECTED.  The SARS-CoV-2 RNA is generally detectable in upper respiratoy specimens during the acute phase of infection. The lowest concentration of SARS-CoV-2 viral copies this assay can detect is 131 copies/mL. A negative result does not preclude SARS-Cov-2 infection and should not be used as the sole basis for treatment or other patient management decisions. A  negative result may occur with  improper specimen collection/handling, submission of specimen other than nasopharyngeal swab, presence of viral mutation(s) within the areas targeted by this assay, and inadequate number of viral copies (<131 copies/mL). A negative result must be combined with clinical observations, patient history, and epidemiological information. The expected result is Negative.  Fact Sheet for Patients:  PinkCheek.be  Fact Sheet for Healthcare Providers:  GravelBags.it  This test is no t yet approved or cleared by the [place] FDA and  has been authorized for detection and/or diagnosis of SARS-CoV-2 by FDA under an Emergency Use Authorization (EUA). This EUA will remain  in effect  (meaning this test can be used) for the duration of the COVID-19 declaration under Section 564(b)(1) of the Act, 21 U.S.C. section 360bbb-3(b)(1), unless the authorization is terminated or revoked sooner.     Influenza A by PCR NEGATIVE NEGATIVE Final   Influenza B by PCR NEGATIVE NEGATIVE Final    Comment: (NOTE) The Xpert Xpress SARS-CoV-2/FLU/RSV assay is intended as an aid in  the diagnosis of influenza from Nasopharyngeal swab specimens and  should not be used as a sole basis for treatment. Nasal washings and  aspirates are unacceptable for Xpert Xpress SARS-CoV-2/FLU/RSV  testing.  Fact Sheet for Patients: PinkCheek.be  Fact Sheet for Healthcare Providers: GravelBags.it  This test is not yet approved or cleared by the [place] FDA and  has been authorized for detection and/or diagnosis of SARS-CoV-2 by  FDA under an Emergency Use Authorization (EUA). This EUA will remain  in effect (meaning this test can be used) for the duration of the  Covid-19 declaration under Section 564(b)(1) of the Act, 21  U.S.C. section 360bbb-3(b)(1), unless the authorization is  terminated or revoked. Performed at [facility], [address] [place], [place] [address]       Scheduled Meds:  bisacodyl  10 mg Oral Once   [facility] Hold] carvedilol  6.25 mg Oral BID WC   [MAR Hold] pantoprazole (PROTONIX) IV  40 mg Intravenous Q12H   [MAR Hold] pravastatin  40 mg Oral QHS   [MAR Hold] vitamin B-12  1,000 mcg Oral Daily   Continuous Infusions:  sodium chloride 20 mL/hr at 12/21/19 1231   sodium chloride     iron sucrose      Assessment/Plan:  1. Acute blood loss anemia.  Iron deficiency anemia.  Patient was given 1 unit of packed red blood cells on a hemoglobin of 6.5.  Hemoglobin came up to 7.7.  Patient wanted to hold off on further transfusion unless she really needs it.  Since ferritin came back at 7 I will  give IV iron.  Endoscopy today showed a little redness in the stomach but no active bleeding.  Gastroenterology wanted to do a colonoscopy tomorrow. 2. Essential hypertension on Coreg.  Holding lisinopril HCT with blood pressure being on the lower side. 3. Memory loss with history of neurosyphilis (2019) in the past. 4. Hyperlipidemia on pravastatin 5. Chronic kidney disease stage IIIa        Code Status:     Code Status Orders  (From admission, onward)         Start     Ordered   12/20/19 1733  Full code  Continuous        12/20/19 1733        Code Status History    Date Active Date Inactive Code Status Order ID Comments User Context   05/29/2017 0344 05/30/2017 1719 Full  Code [id number]  Harrie Foreman, MD Inpatient   Advance Care Planning Activity     Family Communication: Spoke with daughter on the phone Disposition Plan: Status is: Inpatient  Dispo: The patient is from: Home              Anticipated d/c is to: Home              Anticipated d/c date is: Potential 12/22/2019 versus 12/23/2019              Patient currently being followed for acute blood loss anemia.  We will give IV iron today with ferritin being low at 7.  Consultants:  Gastroenterology  Procedures:  Endoscopy  Time spent: 28 minutes  Brailyn Killion [employer]

## 2019-12-21 NOTE — Progress Notes (Signed)
Pt started drinking GoLytely.

## 2019-12-21 NOTE — Treatment Plan (Signed)
Pt to unit via wheelchair at this time, pt is AxOx4. Pt ambulated to bed, then bathroom with steady gait. Pt has no noted skin issues. Pt had half a container of colon prep upon arrival. No family present. Pt has left hearing aid, glasses, purse and clothing with her at this time. Pt denies pain. Pt has elevated BP, coreg given per order. Pt educated to unit and how to call for help at this time.

## 2019-12-21 NOTE — Treatment Plan (Signed)
Colon prep completed at this time.

## 2019-12-21 NOTE — Progress Notes (Signed)
Report given to Tia, RN.

## 2019-12-21 NOTE — Transfer of Care (Signed)
Immediate Anesthesia Transfer of Care Note  Patient: Natalie Knox  Procedure(s) Performed: ESOPHAGOGASTRODUODENOSCOPY (EGD) (N/A )  Patient Location: PACU and Endoscopy Unit  Anesthesia Type:General  Level of Consciousness: drowsy  Airway & Oxygen Therapy: Patient Spontanous Breathing  Post-op Assessment: Report given to RN and Post -op Vital signs reviewed and stable  Post vital signs: Reviewed and stable  Last Vitals:  Vitals Value Taken Time  BP 175/62 12/21/19 1246  Temp    Pulse 62 12/21/19 1246  Resp 22 12/21/19 1246  SpO2 100 % 12/21/19 1246  Vitals shown include unvalidated device data.  Last Pain:  Vitals:   12/21/19 1246  TempSrc:   PainSc: 0-No pain         Complications: No complications documented.

## 2019-12-21 NOTE — Progress Notes (Signed)
Daughter updated about pt's room assignment.

## 2019-12-21 NOTE — Consult Note (Signed)
Natalie Antigua, MD [address] [place], [place], [place], [address] [address], [place], [place], [place], [address] Phone: [phone]  Fax: [phone]  Consultation  Referring Provider:     Dr. Earleen Newport Primary Care Physician:  Derinda Late, MD Reason for Consultation:     Date of Admission:  12/20/2019 Date of Consultation:  12/21/2019         HPI:   Natalie Knox is a 69 y.o. female who was admitted with acute anemia.  Patient is a poor historian.  In H&P it is noted that patient has been reporting black stool, but she denies this to me.  Also reports previously having abdominal pain but does not have any at this time.  No nausea or vomiting.  She reports remote history of a colonoscopy over 10 years ago.  Results not available.  I was able to find an upper endoscopy report in probation from 2003 for dysphagia that showed food in the upper esophagus and recommendations were to consult ENT for removal of food.  I do not see any other GI records besides this.  Past Medical History:  Diagnosis Date  . Allergic rhinitis   . Hyperlipidemia   . Hypertension   . Neurosyphilis     Past Surgical History:  Procedure Laterality Date  . UPPER GI ENDOSCOPY      Prior to Admission medications   Medication Sig Start Date End Date Taking? Authorizing Provider  amLODipine (NORVASC) 10 MG tablet Take 10 mg by mouth daily.   Yes [provider]  aspirin EC 81 MG tablet Take 1 tablet (81 mg total) by mouth daily. 05/26/19  Yes End, Harrell Gave, MD  carvedilol (COREG) 6.25 MG tablet Take 6.25 mg by mouth 2 (two) times daily with a meal.   Yes [provider]  hydrochlorothiazide (MICROZIDE) 12.5 MG capsule Take 1 capsule (12.5 mg total) by mouth daily. 09/21/19 12/20/19 Yes End, Harrell Gave, MD  hydrochlorothiazide (MICROZIDE) 12.5 MG capsule Take 1 capsule (12.5 mg total) by mouth daily. 09/21/19 12/20/19 Yes End, Harrell Gave, MD  lisinopril (PRINIVIL,ZESTRIL) 40  MG tablet Take 40 mg by mouth daily.   Yes [provider]  meloxicam (MOBIC) 7.5 MG tablet Take 1 tablet (7.5 mg total) by mouth daily. 10/19/19 10/18/20 Yes Vallarie Mare M, PA-C  pantoprazole (PROTONIX) 40 MG tablet Take 40 mg by mouth daily. 10/21/19  Yes [provider]  pravastatin (PRAVACHOL) 40 MG tablet Take 40 mg by mouth daily.   Yes [provider]  vitamin B-12 (CYANOCOBALAMIN) 1000 MCG tablet Take 1,000 mcg by mouth daily.   Yes [provider]    Family History  Problem Relation Age of Onset  . Breast cancer Sister   . Heart attack Mother 63     Social History   Tobacco Use  . Smoking status: Former Smoker    Packs/day: 0.50    Years: 25.00    Pack years: 12.50    Types: Cigarettes    Quit date: 05/2018    Years since quitting: 1.5  . Smokeless tobacco: Never Used  Vaping Use  . Vaping Use: Never used  Substance Use Topics  . Alcohol use: Not Currently    Comment: no drink since march  . Drug use: No    Allergies as of 12/20/2019  . (No Known Allergies)    Review of Systems:    All systems reviewed and negative except where noted in HPI.   Physical Exam:  Vital  signs in last 24 hours: Vitals:   12/21/19 0600 12/21/19 0700 12/21/19 0800 12/21/19 0825  BP: (!) 156/60 (!) 151/60 (!) 159/71   Pulse: 64 65 (!) 59   Resp:   16   Temp:   98.7 F (37.1 C)   TempSrc:   Oral   SpO2: 99% 98% 100% 100%  Weight:      Height:         General:   Pleasant, cooperative in NAD Head:  Normocephalic and atraumatic. Eyes:   No icterus.   Conjunctiva pink. PERRLA. Ears:  Normal auditory acuity. Neck:  Supple; no masses or thyroidomegaly Lungs: Respirations even and unlabored. Lungs clear to auscultation bilaterally.   No wheezes, crackles, or rhonchi.  Abdomen:  Soft, nondistended, nontender. Normal bowel sounds. No appreciable masses or hepatomegaly.  No rebound or guarding.  Neurologic:  Alert and oriented x3;  grossly normal  neurologically. Skin:  Intact without significant lesions or rashes. Cervical Nodes:  No significant cervical adenopathy. Psych:  Alert and cooperative. Normal affect.  LAB RESULTS: Recent Labs    12/20/19 1520 12/21/19 0423  WBC 4.7 5.5  HGB 6.5* 7.7*  HCT 22.3* 24.6*  PLT 384 315   BMET Recent Labs    12/20/19 1520 12/21/19 0423  NA 141 141  K 4.0 3.9  CL 111 111  CO2 22 22  GLUCOSE 106* 96  BUN 20 18  CREATININE 1.10* 1.12*  CALCIUM 8.7* 8.7*   LFT No results for input(s): PROT, ALBUMIN, AST, ALT, ALKPHOS, BILITOT, BILIDIR, IBILI in the last 72 hours. PT/INR No results for input(s): LABPROT, INR in the last 72 hours.  STUDIES: No results found.    Impression / Plan:   [name] is a 69 y.o. y/o female with acute anemia and reportedly black stools at home as noted in H&P, but patient being a poor historian herself  Given acute anemia and possible melena at home, EGD recommended at this time for further evaluation  PPI IV twice daily  Continue serial CBCs and transfuse PRN Avoid NSAIDs Maintain 2 large-bore IV lines Please page GI with any acute hemodynamic changes, or signs of active GI bleeding  Anesthesia evaluated in her chart, and given previous history and EKG abnormalities noted on previous chart, and is requesting medical clearance  Above was discussed with Dr. Bobbye Charleston, and he will evaluate the patient today in this regard.  Proceed with upper endoscopy once medically cleared  If upper endoscopy is negative, patient will need colonoscopy for evaluation of anemia and melena  I have discussed alternative options, risks & benefits,  which include, but are not limited to, bleeding, infection, perforation,respiratory complication & drug reaction.  The patient agrees with this plan & written consent will be obtained.     Thank you for involving me in the care of this patient.      LOS: 0 days   Virgel Manifold, MD  12/21/2019, 10:32 AM

## 2019-12-21 NOTE — Op Note (Signed)
[facility] Patient Name: Natalie Knox Procedure Date: 12/21/2019 12:31 PM MRN: [id number] Account #: [id number] Date of Birth: Apr 27, 1950 Admit Type: Inpatient Age: 69 Room: [facility] ENDO ROOM 4 Gender: Female Note Status: Finalized Procedure:             Upper GI endoscopy Indications:           Acute post hemorrhagic anemia Providers:             Rawad Bochicchio B. Bonna Gains MD, MD Referring MD:          Caprice Renshaw MD (Referring MD) Medicines:             Monitored Anesthesia Care Complications:         No immediate complications. Procedure:             Pre-Anesthesia Assessment:                        - The risks and benefits of the procedure and the                         sedation options and risks were discussed with the                         patient. All questions were answered and informed                         consent was obtained.                        - Patient identification and proposed procedure were                         verified prior to the procedure.                        - ASA Grade Assessment: II - A patient with mild                         systemic disease.                        After obtaining informed consent, the endoscope was                         passed under direct vision. Throughout the procedure,                         the patient's blood pressure, pulse, and oxygen                         saturations were monitored continuously. The Endoscope                         was introduced through the mouth, and advanced to the                         second part of duodenum. The upper GI endoscopy was  accomplished with ease. The patient tolerated the                         procedure well. Findings:      The examined esophagus was normal.      Patchy mildly erythematous mucosa without bleeding was found in the       gastric antrum.      The exam of the stomach was otherwise normal.      The  examined duodenum was normal.      Biopsies not obtained due to anemia on this admission Impression:            - Normal esophagus.                        - Erythematous mucosa in the antrum.                        - Normal examined duodenum.                        - No specimens collected. Recommendation:        - Perform a colonoscopy tomorrow.                        - Clear liquid diet today.                        - Continue Serial CBCs and transfuse PRN                        - Return patient to hospital ward for ongoing care.                        - The findings and recommendations were discussed with                         the patient. Procedure Code(s):     --- Professional ---                        [phone], Esophagogastroduodenoscopy, flexible,                         transoral; diagnostic, including collection of                         specimen(s) by brushing or washing, when performed                         (separate procedure) Diagnosis Code(s):     --- Professional ---                        K31.89, Other diseases of stomach and duodenum                        D62, Acute posthemorrhagic anemia CPT copyright 2019 American Medical Association. All rights reserved. The codes documented in this report are preliminary and upon coder review may  be revised to meet current compliance requirements.  [name], MD Margretta Sidle B. Bonna Gains MD, MD 12/21/2019 12:53:45 PM This report has been signed electronically. Number of  Addenda: 0 Note Initiated On: 12/21/2019 12:31 PM Estimated Blood Loss:  Estimated blood loss: none.      [facility]

## 2019-12-21 NOTE — Progress Notes (Signed)
Admitting MD aware of pt's concern over BP meds not ordered. No new orders given.

## 2019-12-21 NOTE — Anesthesia Preprocedure Evaluation (Addendum)
Anesthesia Evaluation  Patient identified by MRN, date of birth, ID band Patient awake    Reviewed: Allergy & Precautions, Patient's Chart, lab work & pertinent test results  Airway Mallampati: II   Neck ROM: full    Dental  (+) Poor Dentition, Missing   Pulmonary neg pulmonary ROS, former smoker,    Pulmonary exam normal        Cardiovascular hypertension, On Medications Normal cardiovascular exam+ Valvular Problems/Murmurs  Rhythm:regular Rate:Normal     Neuro/Psych PSYCHIATRIC DISORDERS neurosyphylis  Neuromuscular disease    GI/Hepatic negative GI ROS, Neg liver ROS,   Endo/Other  negative endocrine ROS  Renal/GU negative Renal ROS  negative genitourinary   Musculoskeletal   Abdominal   Peds negative pediatric ROS (+)  Hematology negative hematology ROS (+) anemia ,   Anesthesia Other Findings Past Medical History: No date: Allergic rhinitis No date: Hyperlipidemia No date: Hypertension No date: Neurosyphilis Past Surgical History: No date: UPPER GI ENDOSCOPY BMI    Body Mass Index: 29.18 kg/m     Reproductive/Obstetrics negative OB ROS                           Anesthesia Physical Anesthesia Plan  ASA: III and emergent  Anesthesia Plan: General   Post-op Pain Management:    Induction:   PONV Risk Score and Plan: Propofol infusion  Airway Management Planned: Nasal Cannula  Additional Equipment:   Intra-op Plan:   Post-operative Plan:   Informed Consent: I have reviewed the patients History and Physical, chart, labs and discussed the procedure including the risks, benefits and alternatives for the proposed anesthesia with the patient or authorized representative who has indicated his/her understanding and acceptance.     Dental Advisory Given  Plan Discussed with: CRNA  Anesthesia Plan Comments:        Anesthesia Quick Evaluation

## 2019-12-21 NOTE — Anesthesia Procedure Notes (Signed)
Date/Time: 12/14/2019 12:34 PM Performed by: Jerrye Noble, CRNA Pre-anesthesia Checklist: Patient identified, Emergency Drugs available, Suction available and Patient being monitored Oxygen Delivery Method: Nasal cannula

## 2019-12-22 ENCOUNTER — Encounter
Admission: EM | Disposition: A | Discharge status: Home / Self Care | Payer: Self-pay | Source: Home / Self Care | Attending: Internal Medicine

## 2019-12-22 ENCOUNTER — Encounter: Payer: Self-pay | Admitting: Gastroenterology

## 2019-12-22 ENCOUNTER — Inpatient Hospital Stay: Payer: Medicare HMO | Admitting: Anesthesiology

## 2019-12-22 ENCOUNTER — Telehealth: Payer: Self-pay | Admitting: Gastroenterology

## 2019-12-22 DIAGNOSIS — D509 Iron deficiency anemia, unspecified: Secondary | ICD-10-CM

## 2019-12-22 DIAGNOSIS — D62 Acute posthemorrhagic anemia: Principal | ICD-10-CM

## 2019-12-22 DIAGNOSIS — K635 Polyp of colon: Secondary | ICD-10-CM

## 2019-12-22 HISTORY — PX: COLONOSCOPY: SHX5424

## 2019-12-22 HISTORY — PX: GIVENS CAPSULE STUDY: SHX5432

## 2019-12-22 LAB — HEMOGLOBIN
Hemoglobin: 8.1 g/dL — ABNORMAL LOW (ref 12.0–15.0)
Hemoglobin: 8.4 g/dL — ABNORMAL LOW (ref 12.0–15.0)
Hemoglobin: 8.6 g/dL — ABNORMAL LOW (ref 12.0–15.0)

## 2019-12-22 LAB — FOLATE RBC
Folate, Hemolysate: 345 ng/mL
Folate, RBC: 1500 ng/mL (ref >498)
Hematocrit: 23 % — ABNORMAL LOW (ref 34.0–46.6)

## 2019-12-22 SURGERY — COLONOSCOPY
Anesthesia: General

## 2019-12-22 MED ORDER — PROPOFOL 500 MG/50ML IV EMUL
INTRAVENOUS | Status: AC
  Filled 2019-12-22: qty 50

## 2019-12-22 MED ORDER — PROPOFOL 500 MG/50ML IV EMUL
INTRAVENOUS | Status: DC | PRN
  Administered 2019-12-22: 150 ug/kg/min via INTRAVENOUS

## 2019-12-22 MED ORDER — PROPOFOL 10 MG/ML IV BOLUS
INTRAVENOUS | Status: DC | PRN
  Administered 2019-12-22: 70 mg via INTRAVENOUS

## 2019-12-22 MED ORDER — HYDROCHLOROTHIAZIDE 12.5 MG PO CAPS
12.5000 mg | ORAL_CAPSULE | Freq: Every day | ORAL | Status: DC
  Administered 2019-12-22: 12.5 mg via ORAL
  Filled 2019-12-22: qty 1

## 2019-12-22 MED ORDER — LISINOPRIL 20 MG PO TABS
40.0000 mg | ORAL_TABLET | Freq: Every day | ORAL | Status: DC
  Administered 2019-12-22: 40 mg via ORAL
  Filled 2019-12-22: qty 2

## 2019-12-22 MED ORDER — AMLODIPINE BESYLATE 5 MG PO TABS
5.0000 mg | ORAL_TABLET | Freq: Every day | ORAL | Status: DC
  Administered 2019-12-22: 5 mg via ORAL
  Filled 2019-12-22: qty 1

## 2019-12-22 MED ORDER — SODIUM CHLORIDE 0.9 % IV SOLN: INTRAVENOUS | Status: DC

## 2019-12-22 NOTE — Treatment Plan (Signed)
Went over AVS with patient at this time. Educated about change in HCTZ 12.5mg . Pt will call [employer] mail order and [facility]. Pt states she has no questions, IV removed. Granddaughter states she will be here in approx 10 minutes to get her.

## 2019-12-22 NOTE — Telephone Encounter (Signed)
[facility] calling to schedule ED follow up for pt from visit dated on 9.28.21. Per discharge instructions pt needs to sch "small bowel capsule study in 1 week". Please advise on scheduling pt.

## 2019-12-22 NOTE — Op Note (Addendum)
[facility] Patient Name: Natalie Knox Procedure Date: 12/22/2019 11:00 AM MRN: [id number] Account #: [id number] Date of Birth: 02-Apr-1950 Admit Type: Inpatient Age: 69 Room: [facility] ENDO ROOM 1 Gender: Female Note Status: Finalized Procedure:             Colonoscopy Indications:           Iron deficiency anemia Providers:             Enslee Bibbins B. Bonna Gains MD, MD Referring MD:          Caprice Renshaw MD (Referring MD) Medicines:             Monitored Anesthesia Care Complications:         No immediate complications. Procedure:             Pre-Anesthesia Assessment:                        - ASA Grade Assessment: II - A patient with mild                         systemic disease.                        - Prior to the procedure, a History and Physical was                         performed, and patient medications, allergies and                         sensitivities were reviewed. [name] tolerance                         of previous anesthesia was reviewed.                        - The risks and benefits of the procedure and the                         sedation options and risks were discussed with the                         patient. All questions were answered and informed                         consent was obtained.                        - Patient identification and proposed procedure were                         verified prior to the procedure by the physician, the                         nurse, the anesthesiologist, the anesthetist and the                         technician. The procedure was verified in the                         procedure room.  After obtaining informed consent, the colonoscope was                         passed under direct vision. Throughout the procedure,                         the patient's blood pressure, pulse, and oxygen                         saturations were monitored continuously. The                          Colonoscope was introduced through the anus and                         advanced to the the cecum, identified by appendiceal                         orifice and ileocecal valve. The colonoscopy was                         performed with ease. The patient tolerated the                         procedure well. The quality of the bowel preparation                         was fair. Findings:      The perianal and digital rectal examinations were normal.      A 6 mm polyp was found in the transverse colon. The polyp was       semi-pedunculated. The polyp was removed with a cold snare. Resection       and retrieval were complete.      A few diverticula were found in the sigmoid colon.      The exam was otherwise without abnormality.      The rectum, sigmoid colon, descending colon, transverse colon, ascending       colon and cecum appeared normal.      The retroflexed view of the distal rectum and anal verge was normal and       showed no anal or rectal abnormalities. Impression:            - Preparation of the colon was fair.                        - One 6 mm polyp in the transverse colon, removed with                         a cold snare. Resected and retrieved.                        - The examination was otherwise normal.                        - The rectum, sigmoid colon, descending colon,                         transverse colon, ascending colon and cecum are normal.                        -  The distal rectum and anal verge are normal on                         retroflexion view. Recommendation:        - Would recommend IV iron supplementation due to                         patient's iron deficiency.                        - If pt has further drop in hemoglobin or active GI                         bleeding, proceed with small bowel capsule study as an                         inpatient. If hemoglobin remains stable and no further                         melena  occurs, small bowel capsule study can be done                         as an outpatient.                        - Patient's symptom of melena at home, remains                         questionable as pt has always denied it to me, but                         this was mentioned in the H&P by the hospitalist.                         However, no red blood or old blood/melena was seen                         during colonoscopy suggesting resolution of any                         bleeding that previously occured. Her stable                         hemoglobin over the last 48 hours is also consistent                         with the same.                        - Await pathology results.                        - Continue present medications.                        - The findings and recommendations were discussed with                         the patient.                        -  The findings and recommendations were discussed with                         the patient's family.                        - Return to primary care physician as previously                         scheduled. Procedure Code(s):     --- Professional ---                        [phone], Colonoscopy, flexible; with removal of                         tumor(s), polyp(s), or other lesion(s) by snare                         technique Diagnosis Code(s):     --- Professional ---                        K63.5, Polyp of colon                        D50.9, Iron deficiency anemia, unspecified CPT copyright 2019 American Medical Association. All rights reserved. The codes documented in this report are preliminary and upon coder review may  be revised to meet current compliance requirements.  [name], MD Margretta Sidle B. Bonna Gains MD, MD 12/22/2019 11:44:43 AM This report has been signed electronically. Number of Addenda: 0 Note Initiated On: 12/22/2019 11:00 AM Scope Withdrawal Time: 0 hours 10 minutes 14 seconds  Total Procedure  Duration: 0 hours 15 minutes 21 seconds  Estimated Blood Loss:  Estimated blood loss: none.      [facility]

## 2019-12-22 NOTE — Plan of Care (Signed)

## 2019-12-22 NOTE — Discharge Summary (Signed)
[facility] [place] NAME: Natalie Knox    MR#:  [id number]  DATE OF BIRTH:  12-Mar-1951  DATE OF ADMISSION:  12/20/2019 ADMITTING PHYSICIAN: Loletha Grayer, MD  DATE OF DISCHARGE: 12/22/2019  3:35 PM  PRIMARY CARE PHYSICIAN: Derinda Late, MD    ADMISSION DIAGNOSIS:  Symptomatic anemia [D64.9] Acute anemia [D64.9] Acute blood loss anemia [D62]  DISCHARGE DIAGNOSIS:  Active Problems:   Symptomatic anemia   Stomach irritation   Acute blood loss anemia   Iron deficiency anemia   Memory loss   Hyperlipidemia   Stage 3a chronic kidney disease ([place])   SECONDARY DIAGNOSIS:   Past Medical History:  Diagnosis Date  . Allergic rhinitis   . Hyperlipidemia   . Hypertension   . Neurosyphilis     HOSPITAL COURSE:   1.  Acute blood loss anemia with iron deficiency anemia.  The patient had an initial hemoglobin of 6.5.  Patient was given 1 unit of packed red blood cells.  Hemoglobin up to 8.6 upon disposition.  I also gave IV Venofer 400 mg.  Endoscopy showed erythema in the antrum of the stomach but otherwise negative.  Colonoscopy showed 6 mm polyp which was removed.  Case discussed with gastroenterology and will follow up as outpatient and set up a small bowel capsule endoscopy.  Follow-up hemoglobin with outpatient appointments.  Hold aspirin and Mobic for now. 2.  Essential hypertension on Coreg.  Yesterday blood pressure was a little low with being n.p.o. all day.  Now blood pressure started rising back up.  Can restart lisinopril HCT and Norvasc.  Continue follow-up blood pressure as outpatient. 3.  Memory loss with history of neurosyphilis back in 2019. 4.  Hyperlipidemia unspecified on pravastatin. 5.  Chronic kidney disease stage IIIa.  DISCHARGE CONDITIONS:   Satisfactory  CONSULTS OBTAINED:  Gastroenterology  DRUG ALLERGIES:  No Known Allergies  DISCHARGE MEDICATIONS:   Allergies as of 12/22/2019   No Known Allergies      Medication List    STOP taking these medications   aspirin EC 81 MG tablet   meloxicam 7.5 MG tablet Commonly known as: Mobic     TAKE these medications   amLODipine 10 MG tablet Commonly known as: NORVASC Take 10 mg by mouth daily.   carvedilol 6.25 MG tablet Commonly known as: COREG Take 6.25 mg by mouth 2 (two) times daily with a meal.   hydrochlorothiazide 12.5 MG capsule Commonly known as: MICROZIDE Take 1 capsule (12.5 mg total) by mouth daily. What changed: Another medication with the same name was removed. Continue taking this medication, and follow the directions you see here.   lisinopril 40 MG tablet Commonly known as: ZESTRIL Take 40 mg by mouth daily.   pantoprazole 40 MG tablet Commonly known as: PROTONIX Take 40 mg by mouth daily.   pravastatin 40 MG tablet Commonly known as: PRAVACHOL Take 40 mg by mouth daily.   vitamin B-12 1000 MCG tablet Commonly known as: CYANOCOBALAMIN Take 1,000 mcg by mouth daily.        DISCHARGE INSTRUCTIONS:   Follow-up with PMD 5 days Dr. Bonna Gains around 1 weeks in order to set up small bowel capsule study.  If you experience worsening of your admission symptoms, develop shortness of breath, life threatening emergency, suicidal or homicidal thoughts you must seek medical attention immediately by calling 911 or calling your MD immediately  if symptoms less severe.  You Must read complete instructions/literature along with all  the possible adverse reactions/side effects for all the Medicines you take and that have been prescribed to you. Take any new Medicines after you have completely understood and accept all the possible adverse reactions/side effects.   Please note  You were cared for by a hospitalist during your hospital stay. If you have any questions about your discharge medications or the care you received while you were in the hospital after you are discharged, you can call the unit and asked to speak with  the hospitalist on call if the hospitalist that took care of you is not available. Once you are discharged, your primary care physician will handle any further medical issues. Please note that NO REFILLS for any discharge medications will be authorized once you are discharged, as it is imperative that you return to your primary care physician (or establish a relationship with a primary care physician if you do not have one) for your aftercare needs so that they can reassess your need for medications and monitor your lab values.    Today   CHIEF COMPLAINT:   Chief Complaint  Patient presents with  . Low Hgb    HISTORY OF PRESENT ILLNESS:  Natalie Knox  is a 69 y.o. female sent in with low hemoglobin   VITAL SIGNS:  Blood pressure (!) 159/70, pulse 65, temperature (!) 97.4 F (36.3 C), temperature source Oral, resp. rate 16, height 5\' 6"  (1.676 m), weight 86.2 kg, SpO2 95 %.  I/O:    Intake/Output Summary (Last 24 hours) at 12/22/2019 1636 Last data filed at 12/22/2019 1130 Gross per 24 hour  Intake 310 ml  Output --  Net 310 ml    PHYSICAL EXAMINATION:  GENERAL:  69 y.o.-year-old patient lying in the bed with no acute distress.  EYES: Pupils equal, round, reactive to light and accommodation.  HEENT: Head atraumatic, normocephalic. Oropharynx and nasopharynx clear.  LUNGS: Normal breath sounds bilaterally, no wheezing, rales,rhonchi or crepitation. No use of accessory muscles of respiration.  CARDIOVASCULAR: S1, S2 normal. No murmurs, rubs, or gallops.  ABDOMEN: Soft, non-tender, non-distended  EXTREMITIES: No pedal edema.  NEUROLOGIC: Cranial nerves II through XII are intact. Muscle strength 5/5 in all extremities. Sensation intact. Gait not checked.  PSYCHIATRIC: The patient is alert and oriented x 3.  SKIN: No obvious rash, lesion, or ulcer.   DATA REVIEW:   CBC Recent Labs  Lab 12/21/19 0423 12/21/19 1657 12/22/19 1305  WBC 5.5  --   --   HGB 7.7*   < > 8.6*  HCT  24.6*  --   --   PLT 315  --   --    < > = values in this interval not displayed.    Chemistries  Recent Labs  Lab 12/21/19 0423  NA 141  K 3.9  CL 111  CO2 22  GLUCOSE 96  BUN 18  CREATININE 1.12*  CALCIUM 8.7*     Microbiology Results  Results for orders placed or performed during the hospital encounter of 12/20/19  Respiratory Panel by RT PCR (Flu A&B, Covid) - Nasopharyngeal Swab     Status: None   Collection Time: 12/20/19  4:55 PM   Specimen: Nasopharyngeal Swab  Result Value Ref Range Status   SARS Coronavirus 2 by RT PCR NEGATIVE NEGATIVE Final    Comment: (NOTE) SARS-CoV-2 target nucleic acids are NOT DETECTED.  The SARS-CoV-2 RNA is generally detectable in upper respiratoy specimens during the acute phase of infection. The lowest concentration of SARS-CoV-2 viral  copies this assay can detect is 131 copies/mL. A negative result does not preclude SARS-Cov-2 infection and should not be used as the sole basis for treatment or other patient management decisions. A negative result may occur with  improper specimen collection/handling, submission of specimen other than nasopharyngeal swab, presence of viral mutation(s) within the areas targeted by this assay, and inadequate number of viral copies (<131 copies/mL). A negative result must be combined with clinical observations, patient history, and epidemiological information. The expected result is Negative.  Fact Sheet for Patients:  PinkCheek.be  Fact Sheet for Healthcare Providers:  GravelBags.it  This test is no t yet approved or cleared by the [place] FDA and  has been authorized for detection and/or diagnosis of SARS-CoV-2 by FDA under an Emergency Use Authorization (EUA). This EUA will remain  in effect (meaning this test can be used) for the duration of the COVID-19 declaration under Section 564(b)(1) of the Act, 21 U.S.C. section  360bbb-3(b)(1), unless the authorization is terminated or revoked sooner.     Influenza A by PCR NEGATIVE NEGATIVE Final   Influenza B by PCR NEGATIVE NEGATIVE Final    Comment: (NOTE) The Xpert Xpress SARS-CoV-2/FLU/RSV assay is intended as an aid in  the diagnosis of influenza from Nasopharyngeal swab specimens and  should not be used as a sole basis for treatment. Nasal washings and  aspirates are unacceptable for Xpert Xpress SARS-CoV-2/FLU/RSV  testing.  Fact Sheet for Patients: PinkCheek.be  Fact Sheet for Healthcare Providers: GravelBags.it  This test is not yet approved or cleared by the [place] FDA and  has been authorized for detection and/or diagnosis of SARS-CoV-2 by  FDA under an Emergency Use Authorization (EUA). This EUA will remain  in effect (meaning this test can be used) for the duration of the  Covid-19 declaration under Section 564(b)(1) of the Act, 21  U.S.C. section 360bbb-3(b)(1), unless the authorization is  terminated or revoked. Performed at [facility], [address] [place], [place] [address]     Management plans discussed with the patient, family and they are in agreement.  CODE STATUS:     Code Status Orders  (From admission, onward)         Start     Ordered   12/20/19 1733  Full code  Continuous        12/20/19 1733        Code Status History    Date Active Date Inactive Code Status Order ID Comments User Context   05/29/2017 0344 05/30/2017 1719 Full Code [id number]  Harrie Foreman, MD Inpatient   Advance Care Planning Activity      TOTAL TIME TAKING CARE OF THIS PATIENT: 35 minutes.    Loletha Grayer M.D on 12/22/2019 at 4:36 PM  Between 7am to 6pm - Pager - [phone]  After 6pm go to [email or web address] - password [email or web address]  [facility]  CC: Primary care physician; Derinda Late, MD

## 2019-12-22 NOTE — Progress Notes (Signed)
Natalie Antigua, MD [address], [place], [place], [place], [address] [address], [place], [place], [place], [address] Phone: [phone]  Fax: [phone]   Subjective: Pt completed her prep. Denies any active bleeding with the clean out   Objective: Exam: Vital signs in last 24 hours: Vitals:   12/22/19 0130 12/22/19 0504 12/22/19 0802 12/22/19 1005  BP: (!) 153/56 (!) 161/62 (!) 160/58 (!) 167/69  Pulse: 66 69 (!) 58 63  Resp: 20 18  18   Temp: 98.8 F (37.1 C) 99 F (37.2 C) 97.8 F (36.6 C) (!) 96.8 F (36 C)  TempSrc: Oral Oral Oral Temporal  SpO2: 98% 100% 100% 99%  Weight:      Height:       Weight change: 9.072 kg  Intake/Output Summary (Last 24 hours) at 12/22/2019 1100 Last data filed at 12/22/2019 0802 Gross per 24 hour  Intake 110 ml  Output --  Net 110 ml    General: No acute distress, AAO x3 Abd: Soft, NT/ND, No HSM Skin: Warm, no rashes Neck: Supple, Trachea midline   Lab Results: Lab Results  Component Value Date   WBC 5.5 12/21/2019   HGB 8.1 (L) 12/22/2019   HCT 24.6 (L) 12/21/2019   MCV 75.2 (L) 12/21/2019   PLT 315 12/21/2019   Micro Results: Recent Results (from the past 240 hour(s))  Respiratory Panel by RT PCR (Flu A&B, Covid) - Nasopharyngeal Swab     Status: None   Collection Time: 12/20/19  4:55 PM   Specimen: Nasopharyngeal Swab  Result Value Ref Range Status   SARS Coronavirus 2 by RT PCR NEGATIVE NEGATIVE Final    Comment: (NOTE) SARS-CoV-2 target nucleic acids are NOT DETECTED.  The SARS-CoV-2 RNA is generally detectable in upper respiratoy specimens during the acute phase of infection. The lowest concentration of SARS-CoV-2 viral copies this assay can detect is 131 copies/mL. A negative result does not preclude SARS-Cov-2 infection and should not be used as the sole basis for treatment or other patient management decisions. A negative result may occur with  improper specimen collection/handling,  submission of specimen other than nasopharyngeal swab, presence of viral mutation(s) within the areas targeted by this assay, and inadequate number of viral copies (<131 copies/mL). A negative result must be combined with clinical observations, patient history, and epidemiological information. The expected result is Negative.  Fact Sheet for Patients:  PinkCheek.be  Fact Sheet for Healthcare Providers:  GravelBags.it  This test is no t yet approved or cleared by the [place] FDA and  has been authorized for detection and/or diagnosis of SARS-CoV-2 by FDA under an Emergency Use Authorization (EUA). This EUA will remain  in effect (meaning this test can be used) for the duration of the COVID-19 declaration under Section 564(b)(1) of the Act, 21 U.S.C. section 360bbb-3(b)(1), unless the authorization is terminated or revoked sooner.     Influenza A by PCR NEGATIVE NEGATIVE Final   Influenza B by PCR NEGATIVE NEGATIVE Final    Comment: (NOTE) The Xpert Xpress SARS-CoV-2/FLU/RSV assay is intended as an aid in  the diagnosis of influenza from Nasopharyngeal swab specimens and  should not be used as a sole basis for treatment. Nasal washings and  aspirates are unacceptable for Xpert Xpress SARS-CoV-2/FLU/RSV  testing.  Fact Sheet for Patients: PinkCheek.be  Fact Sheet for Healthcare Providers: GravelBags.it  This test is not yet approved or cleared by the [place] FDA and  has been authorized for detection and/or diagnosis of  SARS-CoV-2 by  FDA under an Emergency Use Authorization (EUA). This EUA will remain  in effect (meaning this test can be used) for the duration of the  Covid-19 declaration under Section 564(b)(1) of the Act, 21  U.S.C. section 360bbb-3(b)(1), unless the authorization is  terminated or revoked. Performed at [facility], [address], [place], [place] [address]    Studies/Results: No results found. Medications:  Scheduled Meds: . [MAR Hold] carvedilol  6.25 mg Oral BID WC  . [MAR Hold] lisinopril  40 mg Oral Daily  . [MAR Hold] pantoprazole (PROTONIX) IV  40 mg Intravenous Q12H  . [MAR Hold] pravastatin  40 mg Oral QHS  . [MAR Hold] vitamin B-12  1,000 mcg Oral Daily   Continuous Infusions: . sodium chloride 20 mL/hr at 12/22/19 1012   PRN Meds:.   Assessment: Active Problems:   Symptomatic anemia   Stomach irritation   Acute blood loss anemia   Iron deficiency anemia   Memory loss   Hyperlipidemia   Stage 3a chronic kidney disease (HCC)    Plan: Proceed with colonoscopy today for evaluation of anemia Pt may need small bowel capsule study today or tomorrow if colonoscopy is normal  I have discussed alternative options, risks & benefits,  which include, but are not limited to, bleeding, infection, perforation,respiratory complication & drug reaction.  The patient agrees with this plan & written consent will be obtained.      LOS: 1 day   Natalie Antigua, MD 12/22/2019, 11:00 AM

## 2019-12-22 NOTE — Discharge Instructions (Signed)
Stop aspirin and mobic for now   Anemia  Anemia is a condition in which you do not have enough red blood cells or hemoglobin. Hemoglobin is a substance in red blood cells that carries oxygen. When you do not have enough red blood cells or hemoglobin (are anemic), your body cannot get enough oxygen and your organs may not work properly. As a result, you may feel very tired or have other problems. What are the causes? Common causes of anemia include:  Excessive bleeding. Anemia can be caused by excessive bleeding inside or outside the body, including bleeding from the intestine or from periods in women.  Poor nutrition.  Long-lasting (chronic) kidney, thyroid, and liver disease.  Bone marrow disorders.  Cancer and treatments for cancer.  HIV (human immunodeficiency virus) and AIDS (acquired immunodeficiency syndrome).  Treatments for HIV and AIDS.  Spleen problems.  Blood disorders.  Infections, medicines, and autoimmune disorders that destroy red blood cells. What are the signs or symptoms? Symptoms of this condition include:  Minor weakness.  Dizziness.  Headache.  Feeling heartbeats that are irregular or faster than normal (palpitations).  Shortness of breath, especially with exercise.  Paleness.  Cold sensitivity.  Indigestion.  Nausea.  Difficulty sleeping.  Difficulty concentrating. Symptoms may occur suddenly or develop slowly. If your anemia is mild, you may not have symptoms. How is this diagnosed? This condition is diagnosed based on:  Blood tests.  Your medical history.  A physical exam.  Bone marrow biopsy. Your health care provider may also check your stool (feces) for blood and may do additional testing to look for the cause of your bleeding. You may also have other tests, including:  Imaging tests, such as a CT scan or MRI.  Endoscopy.  Colonoscopy. How is this treated? Treatment for this condition depends on the cause. If you  continue to lose a lot of blood, you may need to be treated at a hospital. Treatment may include:  Taking supplements of iron, vitamin B63, or folic acid.  Taking a hormone medicine (erythropoietin) that can help to stimulate red blood cell growth.  Having a blood transfusion. This may be needed if you lose a lot of blood.  Making changes to your diet.  Having surgery to remove your spleen. Follow these instructions at home:  Take over-the-counter and prescription medicines only as told by your health care provider.  Take supplements only as told by your health care provider.  Follow any diet instructions that you were given.  Keep all follow-up visits as told by your health care provider. This is important. Contact a health care provider if:  You develop new bleeding anywhere in the body. Get help right away if:  You are very weak.  You are short of breath.  You have pain in your abdomen or chest.  You are dizzy or feel faint.  You have trouble concentrating.  You have bloody or black, tarry stools.  You vomit repeatedly or you vomit up blood. Summary  Anemia is a condition in which you do not have enough red blood cells or enough of a substance in your red blood cells that carries oxygen (hemoglobin).  Symptoms may occur suddenly or develop slowly.  If your anemia is mild, you may not have symptoms.  This condition is diagnosed with blood tests as well as a medical history and physical exam. Other tests may be needed.  Treatment for this condition depends on the cause of the anemia. This information is not  intended to replace advice given to you by your health care provider. Make sure you discuss any questions you have with your health care provider. Document Revised: 02/20/2017 Document Reviewed: 04/11/2016 Elsevier Patient Education  [employer].

## 2019-12-22 NOTE — Anesthesia Preprocedure Evaluation (Signed)
Anesthesia Evaluation  Patient identified by MRN, date of birth, ID band Patient awake    Reviewed: Allergy & Precautions, Patient's Chart, lab work & pertinent test results  History of Anesthesia Complications Negative for: history of anesthetic complications  Airway Mallampati: II   Neck ROM: full    Dental  (+) Poor Dentition, Missing   Pulmonary neg pulmonary ROS, former smoker,    Pulmonary exam normal breath sounds clear to auscultation       Cardiovascular hypertension, On Medications (-) Past MI, (-) Cardiac Stents and (-) CHF Normal cardiovascular exam+ Valvular Problems/Murmurs  Rhythm:regular Rate:Normal  TTE 2020: 1. Left ventricular ejection fraction, by visual estimation, is 60 to  65%. The left ventricle has normal function. Normal left ventricular size.  There is moderate to severely increased left ventricular hypertrophy.Near  cavity obliteration in systole.  Unable to exclude mild LVOT gradient (not measured)  2. Left ventricular diastolic Doppler parameters are consistent with  impaired relaxation pattern of LV diastolic filling.  3. Global right ventricle has normal systolic function.The right  ventricular size is normal. No increase in right ventricular wall  thickness.  4. Mildly elevated pulmonary artery systolic pressure.  5. Left atrial size was normal.    Neuro/Psych PSYCHIATRIC DISORDERS neurosyphylis  Neuromuscular disease    GI/Hepatic negative GI ROS, Neg liver ROS,   Endo/Other  negative endocrine ROS  Renal/GU CRFRenal disease  negative genitourinary   Musculoskeletal   Abdominal   Peds negative pediatric ROS (+)  Hematology negative hematology ROS (+) anemia ,   Anesthesia Other Findings Past Medical History: No date: Allergic rhinitis No date: Hyperlipidemia No date: Hypertension No date: Neurosyphilis Past Surgical History: No date: UPPER GI ENDOSCOPY BMI    Body  Mass Index: 29.18 kg/m     Reproductive/Obstetrics negative OB ROS                             Anesthesia Physical  Anesthesia Plan  ASA: III  Anesthesia Plan: General   Post-op Pain Management:    Induction: Intravenous  PONV Risk Score and Plan: 3 and Propofol infusion and Ondansetron  Airway Management Planned: Nasal Cannula  Additional Equipment: None  Intra-op Plan:   Post-operative Plan:   Informed Consent: I have reviewed the patients History and Physical, chart, labs and discussed the procedure including the risks, benefits and alternatives for the proposed anesthesia with the patient or authorized representative who has indicated his/her understanding and acceptance.     Dental Advisory Given  Plan Discussed with: CRNA  Anesthesia Plan Comments: (Discussed risks of anesthesia with patient, including possibility of difficulty with spontaneous ventilation under anesthesia necessitating airway intervention, PONV, and rare risks such as cardiac or respiratory or neurological events. Patient understands.)        Anesthesia Quick Evaluation

## 2019-12-22 NOTE — Anesthesia Postprocedure Evaluation (Signed)
Anesthesia Post Note  Patient: Natalie Knox  Procedure(s) Performed: COLONOSCOPY (N/A ) GIVENS CAPSULE STUDY (N/A )  Patient location during evaluation: Endoscopy Anesthesia Type: General Level of consciousness: awake and alert Pain management: pain level controlled Vital Signs Assessment: post-procedure vital signs reviewed and stable Respiratory status: spontaneous breathing, nonlabored ventilation, respiratory function stable and patient connected to nasal cannula oxygen Cardiovascular status: blood pressure returned to baseline and stable Postop Assessment: no apparent nausea or vomiting Anesthetic complications: no   No complications documented.   Last Vitals:  Vitals:   12/22/19 1143 12/22/19 1214  BP:  (!) 181/64  Pulse:  61  Resp: (!) 21 16  Temp:  (!) 36.3 C  SpO2:  100%    Last Pain:  Vitals:   12/22/19 1214  TempSrc: Oral  PainSc:                  Arita Miss

## 2019-12-22 NOTE — Transfer of Care (Signed)
Immediate Anesthesia Transfer of Care Note  Patient: Natalie Knox  Procedure(s) Performed: COLONOSCOPY (N/A ) GIVENS CAPSULE STUDY (N/A )  Patient Location: PACU and Endoscopy Unit  Anesthesia Type:General  Level of Consciousness: drowsy  Airway & Oxygen Therapy: Patient Spontanous Breathing  Post-op Assessment: Report given to RN and Post -op Vital signs reviewed and stable  Post vital signs: Reviewed and stable  Last Vitals:  Vitals Value Taken Time  BP 139/56 12/22/19 1134  Temp 36.5 C 12/22/19 1133  Pulse 69 12/22/19 1135  Resp 13 12/22/19 1135  SpO2 96 % 12/22/19 1135  Vitals shown include unvalidated device data.  Last Pain:  Vitals:   12/22/19 1133  TempSrc: Temporal  PainSc: 0-No pain         Complications: No complications documented.

## 2019-12-23 ENCOUNTER — Encounter: Payer: Self-pay | Admitting: Gastroenterology

## 2019-12-23 LAB — SURGICAL PATHOLOGY

## 2019-12-23 NOTE — Telephone Encounter (Signed)
Patient is scheduled to have a virtual visit on 12/28/2019.

## 2019-12-28 ENCOUNTER — Telehealth: Payer: Self-pay

## 2019-12-28 ENCOUNTER — Telehealth (INDEPENDENT_AMBULATORY_CARE_PROVIDER_SITE_OTHER): Payer: Medicare HMO | Admitting: Gastroenterology

## 2019-12-28 DIAGNOSIS — D509 Iron deficiency anemia, unspecified: Secondary | ICD-10-CM

## 2019-12-28 NOTE — Progress Notes (Signed)
[name], MD [address]  [place]  [place], [place] [address]  Main: [phone]  Fax: [phone]   Primary Care Physician: Derinda Late, MD  Virtual Visit via Telephone Note  I connected with patient on 12/28/19 at  2:30 PM EDT by telephone and verified that I am speaking with the correct person using two identifiers.   I discussed the limitations, risks, security and privacy concerns of performing an evaluation and management service by telephone and the availability of in person appointments. I also discussed with the patient that there may be a patient responsible charge related to this service. The patient expressed understanding and agreed to proceed.  Location of Patient: Home Location of Provider: Home Persons involved: Patient and provider only during the visit (nursing staff and front desk staff was involved in communicating with the patient prior to the appointment, reviewing medications and checking them in)   History of Present Illness: Chief Complaint  Patient presents with  . Anemia     HPI: Natalie Knox is a 69 y.o. female here for follow-up of iron deficiency anemia.  EGD and colonoscopy unrevealing of source of anemia.  Patient has not had any active bleeding since hospital discharge.  Denies any abdominal pain, blood in stool.  No weakness no shortness of breath  Current Outpatient Medications  Medication Sig Dispense Refill  . amLODipine (NORVASC) 10 MG tablet Take 10 mg by mouth daily.    . carvedilol (COREG) 6.25 MG tablet Take 6.25 mg by mouth 2 (two) times daily with a meal.    . hydrochlorothiazide (MICROZIDE) 12.5 MG capsule Take 1 capsule (12.5 mg total) by mouth daily. 30 capsule 0  . lisinopril (PRINIVIL,ZESTRIL) 40 MG tablet Take 40 mg by mouth daily.    . pantoprazole (PROTONIX) 40 MG tablet Take 40 mg by mouth daily.    . pravastatin (PRAVACHOL) 40 MG tablet Take 40 mg by mouth daily.    . vitamin B-12 (CYANOCOBALAMIN)  1000 MCG tablet Take 1,000 mcg by mouth daily.     No current facility-administered medications for this visit.    Allergies as of 12/28/2019  . (No Known Allergies)    Review of Systems:    All systems reviewed and negative except where noted in HPI.   Observations/Objective:  Labs: CMP     Component Value Date/Time   NA 141 12/21/2019 0423   NA 146 (H) 07/08/2018 1117   K 3.9 12/21/2019 0423   CL 111 12/21/2019 0423   CO2 22 12/21/2019 0423   GLUCOSE 96 12/21/2019 0423   BUN 18 12/21/2019 0423   BUN 33 (H) 07/08/2018 1117   CREATININE 1.12 (H) 12/21/2019 0423   CALCIUM 8.7 (L) 12/21/2019 0423   PROT 7.0 12/02/2018 0909   PROT 7.0 07/08/2018 1117   ALBUMIN 4.0 12/02/2018 0909   ALBUMIN 4.4 07/08/2018 1117   AST 21 12/02/2018 0909   ALT 19 12/02/2018 0909   ALKPHOS 75 12/02/2018 0909   BILITOT 0.5 12/02/2018 0909   BILITOT 0.3 07/08/2018 1117   GFRNONAA 49 (L) 12/21/2019 0423   GFRAA 57 (L) 12/21/2019 0423   Lab Results  Component Value Date   WBC 5.5 12/21/2019   HGB 8.6 (L) 12/22/2019   HCT 24.6 (L) 12/21/2019   MCV 75.2 (L) 12/21/2019   PLT 315 12/21/2019    Imaging Studies: No results found.  Assessment and Plan:   Natalie Knox is a 69 y.o. y/o female here for follow-up  of iron deficiency anemia  Assessment and Plan: We discussed need for hematology referral for consideration of IV iron transfusion We also discussed need for capsule study due to her iron deficiency anemia with EGD and colonoscopy being unrevealing of source of her iron deficiency  Patient states she would like to discuss this with her daughter and she will give [place] a call with any questions or concerns or if she is ready to schedule the above    Follow Up Instructions:    I discussed the assessment and treatment plan with the patient. The patient was provided an opportunity to ask questions and all were answered. The patient agreed with the plan and demonstrated an understanding  of the instructions.   The patient was advised to call back or seek an in-person evaluation if the symptoms worsen or if the condition fails to improve as anticipated.  I provided 15 minutes of non-face-to-face time during this encounter. Additional time was spent in reviewing patient's chart, placing orders etc.   Virgel Manifold, MD  Speech recognition software was used to dictate this note.

## 2019-12-28 NOTE — Telephone Encounter (Signed)
She would like to talk to her daughter and call us back. I recommended hematology referral and small bowel capsule study for iron deficiency anemia. If they call back and are agreeable, place both. 4-week phone follow-up

## 2019-12-28 NOTE — Addendum Note (Signed)
Addended by: Wayna Chalet on: 12/28/2019 03:07 PM   Modules accepted: Orders

## 2019-12-30 ENCOUNTER — Telehealth: Payer: Self-pay

## 2019-12-30 NOTE — Telephone Encounter (Signed)
Natalie Knox,  Patient returned your call today in regards to her appointments to be scheduled.  I returned her call LVM to let her know the message was received and I will have you call her back next week.  Thanks,  Leslie, [place]

## 2020-01-02 ENCOUNTER — Telehealth: Payer: Self-pay | Admitting: Gastroenterology

## 2020-01-02 DIAGNOSIS — D509 Iron deficiency anemia, unspecified: Secondary | ICD-10-CM | POA: Diagnosis not present

## 2020-01-02 DIAGNOSIS — I10 Essential (primary) hypertension: Secondary | ICD-10-CM | POA: Diagnosis not present

## 2020-01-02 DIAGNOSIS — R739 Hyperglycemia, unspecified: Secondary | ICD-10-CM | POA: Diagnosis not present

## 2020-01-02 DIAGNOSIS — E785 Hyperlipidemia, unspecified: Secondary | ICD-10-CM | POA: Diagnosis not present

## 2020-01-02 NOTE — Telephone Encounter (Signed)
Patient called to see when she could be seen in person or a virtual appt. Information was given to Provider.

## 2020-01-03 ENCOUNTER — Other Ambulatory Visit: Payer: Self-pay

## 2020-01-03 ENCOUNTER — Inpatient Hospital Stay: Payer: Medicare HMO

## 2020-01-03 ENCOUNTER — Inpatient Hospital Stay: Payer: Medicare HMO | Admitting: Oncology

## 2020-01-03 DIAGNOSIS — D509 Iron deficiency anemia, unspecified: Secondary | ICD-10-CM

## 2020-01-03 DIAGNOSIS — Z8661 Personal history of infections of the central nervous system: Secondary | ICD-10-CM | POA: Diagnosis not present

## 2020-01-03 DIAGNOSIS — R413 Other amnesia: Secondary | ICD-10-CM | POA: Diagnosis not present

## 2020-01-03 NOTE — Telephone Encounter (Signed)
Called patient back and she did not answer. However, I went ahead and scheduled her for her capsule study and follow up appointment with Dr. Bonna Gains. I told her to call me if she had any questions.

## 2020-01-05 ENCOUNTER — Encounter: Payer: Self-pay | Admitting: Gastroenterology

## 2020-01-10 ENCOUNTER — Other Ambulatory Visit: Payer: Self-pay

## 2020-01-10 ENCOUNTER — Inpatient Hospital Stay: Payer: Medicare HMO | Attending: Oncology | Admitting: Oncology

## 2020-01-10 ENCOUNTER — Encounter: Payer: Self-pay | Admitting: Oncology

## 2020-01-10 ENCOUNTER — Inpatient Hospital Stay: Payer: Medicare HMO

## 2020-01-10 VITALS — BP 139/44 | HR 57 | Temp 98.4°F | Resp 16 | Ht 66.0 in | Wt 183.0 lb

## 2020-01-10 DIAGNOSIS — Z8249 Family history of ischemic heart disease and other diseases of the circulatory system: Secondary | ICD-10-CM | POA: Diagnosis not present

## 2020-01-10 DIAGNOSIS — Z79899 Other long term (current) drug therapy: Secondary | ICD-10-CM | POA: Diagnosis not present

## 2020-01-10 DIAGNOSIS — E785 Hyperlipidemia, unspecified: Secondary | ICD-10-CM | POA: Diagnosis not present

## 2020-01-10 DIAGNOSIS — Z87891 Personal history of nicotine dependence: Secondary | ICD-10-CM | POA: Insufficient documentation

## 2020-01-10 DIAGNOSIS — N1831 Chronic kidney disease, stage 3a: Secondary | ICD-10-CM | POA: Diagnosis not present

## 2020-01-10 DIAGNOSIS — Z803 Family history of malignant neoplasm of breast: Secondary | ICD-10-CM | POA: Diagnosis not present

## 2020-01-10 DIAGNOSIS — K5900 Constipation, unspecified: Secondary | ICD-10-CM | POA: Insufficient documentation

## 2020-01-10 DIAGNOSIS — D509 Iron deficiency anemia, unspecified: Secondary | ICD-10-CM | POA: Insufficient documentation

## 2020-01-10 LAB — CBC WITH DIFFERENTIAL/PLATELET
Abs Immature Granulocytes: 0.02 10*3/uL (ref 0.00–0.07)
Basophils Absolute: 0.1 10*3/uL (ref 0.0–0.1)
Basophils Relative: 1 %
Eosinophils Absolute: 0.2 10*3/uL (ref 0.0–0.5)
Eosinophils Relative: 2 %
HCT: 32.4 % — ABNORMAL LOW (ref 36.0–46.0)
Hemoglobin: 10.1 g/dL — ABNORMAL LOW (ref 12.0–15.0)
Immature Granulocytes: 0 %
Lymphocytes Relative: 27 %
Lymphs Abs: 1.8 10*3/uL (ref 0.7–4.0)
MCH: 24.9 pg — ABNORMAL LOW (ref 26.0–34.0)
MCHC: 31.2 g/dL (ref 30.0–36.0)
MCV: 79.8 fL — ABNORMAL LOW (ref 80.0–100.0)
Monocytes Absolute: 0.6 10*3/uL (ref 0.1–1.0)
Monocytes Relative: 8 %
Neutro Abs: 4.1 10*3/uL (ref 1.7–7.7)
Neutrophils Relative %: 62 %
Platelets: 344 10*3/uL (ref 150–400)
RBC: 4.06 MIL/uL (ref 3.87–5.11)
RDW: 23.6 % — ABNORMAL HIGH (ref 11.5–15.5)
WBC: 6.6 10*3/uL (ref 4.0–10.5)
nRBC: 0 % (ref 0.0–0.2)

## 2020-01-10 NOTE — Progress Notes (Signed)
Hematology/Oncology Consult note [facility] Telephone:([phone] [phone]  Patient Care Team: Derinda Late, MD as PCP - General (Family Medicine)   Name of the patient: Natalie Knox  [id number]  08/29/1950    Reason for referral-microcytic anemia   Referring physician-Dr. Baldemar Lenis  Date of visit: 01/10/20   History of presenting illness-patient is a 69 year old African-American female With a past medical history significant for hypertension hyperlipidemia was recently admitted to the hospital for acute blood loss anemia.  She had EGD and colonoscopy showed erythema in the antrum of the stomach but did not reveal any cause of bleeding.  She has not yet completed her capsule study.  She was found to have a hemoglobin of 6.5 on admission with iron studies indicative of iron deficiency.  Patient was given 1 unit of blood transfusion and 1 dose of Venofer.  Her hemoglobin on discharge was 8.6.  Patient currently reports some fatigue.  She is hard of hearing.  Denies any blood loss in her stool and urine.  Denies any vaginal bleeding  ECOG PS- 1  Pain scale- 0   Review of systems- Review of Systems  Constitutional: Positive for malaise/fatigue. Negative for chills, fever and weight loss.  HENT: Negative for congestion, ear discharge and nosebleeds.   Eyes: Negative for blurred vision.  Respiratory: Negative for cough, hemoptysis, sputum production, shortness of breath and wheezing.   Cardiovascular: Negative for chest pain, palpitations, orthopnea and claudication.  Gastrointestinal: Negative for abdominal pain, blood in stool, constipation, diarrhea, heartburn, melena, nausea and vomiting.  Genitourinary: Negative for dysuria, flank pain, frequency, hematuria and urgency.  Musculoskeletal: Negative for back pain, joint pain and myalgias.  Skin: Negative for rash.  Neurological: Negative for dizziness, tingling, focal weakness, seizures, weakness  and headaches.  Endo/Heme/Allergies: Does not bruise/bleed easily.  Psychiatric/Behavioral: Negative for depression and suicidal ideas. The patient does not have insomnia.     No Known Allergies  Patient Active Problem List   Diagnosis Date Noted  . Acute blood loss anemia 12/21/2019  . Stomach irritation   . Iron deficiency anemia   . Memory loss   . Hyperlipidemia   . Stage 3a chronic kidney disease ([place])   . Symptomatic anemia 12/20/2019  . Elevated blood sugar level 06/23/2019  . Abnormal EKG 12/16/2018  . LVH (left ventricular hypertrophy) 12/16/2018  . Heart murmur 11/18/2018  . Atypical chest pain 11/18/2018  . Encounter for general adult medical examination with abnormal findings 01/04/2018  . Dysuria 01/04/2018  . Cellulitis and abscess of buttock 09/17/2017  . Constipation 08/19/2017  . Syphilis 08/19/2017  . History of neurosyphilis 07/14/2017  . Alcohol dependence with alcohol-induced psychotic disorder with complication ([place]) 42/35/3614  . Bilateral carotid bruits 06/20/2017  . Essential hypertension 06/20/2017  . Tobacco dependence 06/20/2017  . Deficiency of other specified B group vitamins 06/17/2017  . Metabolic encephalopathy 43/15/4008  . Altered mental status      Past Medical History:  Diagnosis Date  . Allergic rhinitis   . Hyperlipidemia   . Hypertension   . Neurosyphilis      Past Surgical History:  Procedure Laterality Date  . COLONOSCOPY    . COLONOSCOPY N/A 12/22/2019   Procedure: COLONOSCOPY;  Surgeon: Virgel Manifold, MD;  Location: [facility] ENDOSCOPY;  Service: Endoscopy;  Laterality: N/A;  . ESOPHAGOGASTRODUODENOSCOPY N/A 12/21/2019   Procedure: ESOPHAGOGASTRODUODENOSCOPY (EGD);  Surgeon: Virgel Manifold, MD;  Location[facility] ENDOSCOPY;  Service: Endoscopy;  Laterality: N/A;  . GIVENS CAPSULE STUDY  N/A 12/22/2019   Procedure: GIVENS CAPSULE STUDY;  Surgeon: Virgel Manifold, MD;  Location: [facility] ENDOSCOPY;  Service: Endoscopy;   Laterality: N/A;  If colon finds nothing, needs a capsule endoscopy  . UPPER GI ENDOSCOPY      Social History   Socioeconomic History  . Marital status: Single    Spouse name: Not on file  . Number of children: Not on file  . Years of education: Not on file  . Highest education level: Not on file  Occupational History  . Not on file  Tobacco Use  . Smoking status: Former Smoker    Packs/day: 0.50    Years: 25.00    Pack years: 12.50    Types: Cigarettes    Quit date: 05/2018    Years since quitting: 1.6  . Smokeless tobacco: Never Used  Vaping Use  . Vaping Use: Never used  Substance and Sexual Activity  . Alcohol use: Not Currently    Comment: no drink since march  . Drug use: No  . Sexual activity: Not on file  Other Topics Concern  . Not on file  Social History Narrative  . Not on file   Social Determinants of Health   Financial Resource Strain:   . Difficulty of Paying Living Expenses: Not on file  Food Insecurity:   . Worried About Charity fundraiser in the Last Year: Not on file  . Ran Out of Food in the Last Year: Not on file  Transportation Needs:   . Lack of Transportation (Medical): Not on file  . Lack of Transportation (Non-Medical): Not on file  Physical Activity:   . Days of Exercise per Week: Not on file  . Minutes of Exercise per Session: Not on file  Stress:   . Feeling of Stress : Not on file  Social Connections:   . Frequency of Communication with Friends and Family: Not on file  . Frequency of Social Gatherings with Friends and Family: Not on file  . Attends Religious Services: Not on file  . Active Member of Clubs or Organizations: Not on file  . Attends Archivist Meetings: Not on file  . Marital Status: Not on file  Intimate Partner Violence:   . Fear of Current or Ex-Partner: Not on file  . Emotionally Abused: Not on file  . Physically Abused: Not on file  . Sexually Abused: Not on file     Family History  Problem  Relation Age of Onset  . Breast cancer Sister   . Heart attack Mother 41     Current Outpatient Medications:  .  amLODipine (NORVASC) 10 MG tablet, Take 10 mg by mouth daily., Disp: , Rfl:  .  carvedilol (COREG) 6.25 MG tablet, Take 6.25 mg by mouth 2 (two) times daily with a meal., Disp: , Rfl:  .  hydrochlorothiazide (MICROZIDE) 12.5 MG capsule, Take 1 capsule (12.5 mg total) by mouth daily., Disp: 30 capsule, Rfl: 0 .  lisinopril (PRINIVIL,ZESTRIL) 40 MG tablet, Take 40 mg by mouth daily., Disp: , Rfl:  .  pantoprazole (PROTONIX) 40 MG tablet, Take 40 mg by mouth daily., Disp: , Rfl:  .  pravastatin (PRAVACHOL) 40 MG tablet, Take 40 mg by mouth daily., Disp: , Rfl:  .  vitamin B-12 (CYANOCOBALAMIN) 1000 MCG tablet, Take 1,000 mcg by mouth daily., Disp: , Rfl:    Physical exam:  Vitals:   01/10/20 1457  BP: (!) 139/44  Pulse: (!) 57  Resp: 16  Temp: 98.4 F (36.9 C)  TempSrc: Oral  Weight: 183 lb (83 kg)  Height: 5\' 6"  (1.676 m)   Physical Exam Cardiovascular:     Rate and Rhythm: Normal rate and regular rhythm.     Heart sounds: Normal heart sounds.  Pulmonary:     Effort: Pulmonary effort is normal.     Breath sounds: Normal breath sounds.  Abdominal:     General: Bowel sounds are normal.     Palpations: Abdomen is soft.  Skin:    General: Skin is warm and dry.  Neurological:     Mental Status: She is alert and oriented to person, place, and time.        CMP Latest Ref Rng & Units 12/21/2019  Glucose 70 - 99 mg/dL 96  BUN 8 - 23 mg/dL 18  Creatinine 0.44 - 1.00 mg/dL 1.12(H)  Sodium 135 - 145 mmol/L 141  Potassium 3.5 - 5.1 mmol/L 3.9  Chloride 98 - 111 mmol/L 111  CO2 22 - 32 mmol/L 22  Calcium 8.9 - 10.3 mg/dL 8.7(L)  Total Protein 6.5 - 8.1 g/dL -  Total Bilirubin 0.3 - 1.2 mg/dL -  Alkaline Phos 38 - 126 U/L -  AST 15 - 41 U/L -  ALT 0 - 44 U/L -   CBC Latest Ref Rng & Units 12/22/2019  WBC 4.0 - 10.5 K/uL -  Hemoglobin 12.0 - 15.0 g/dL 8.6(L)    Hematocrit 36 - 46 % -  Platelets 150 - 400 K/uL -     Assessment and plan- Patient is a 69 y.o. female referred for iron deficiency anemia  Patient had blood work during her hospital stay last month which was suggestive of iron deficiency.  EGD and colonoscopy have been unremarkable so far.  I will check a CBC with differential today and proceed with 2 doses of Feraheme 510 mg IV weekly.  Discussed risks and benefits of Feraheme including all but not limited to nausea, leg swelling and possible risk of infusion reaction.  Patient understands and agrees to proceed as planned.  I will see her back in 2 months with CBC ferritin and iron studies     Visit Diagnosis 1. Iron deficiency anemia, unspecified iron deficiency anemia type     Dr. Randa Evens, MD, MPH [facility] [phone] 01/10/2020

## 2020-01-10 NOTE — Progress Notes (Signed)
Pt eats good, feels ok. Works til 2 pm during weekdays. She goes to gym and works her legs and arms several days a week. She does not feel tired from anemia .

## 2020-01-12 ENCOUNTER — Ambulatory Visit (INDEPENDENT_AMBULATORY_CARE_PROVIDER_SITE_OTHER): Payer: Medicare HMO | Admitting: Nurse Practitioner

## 2020-01-12 ENCOUNTER — Other Ambulatory Visit: Payer: Self-pay

## 2020-01-12 ENCOUNTER — Encounter: Payer: Self-pay | Admitting: Nurse Practitioner

## 2020-01-12 VITALS — BP 156/60 | HR 68 | Ht 66.0 in | Wt 185.0 lb

## 2020-01-12 DIAGNOSIS — E782 Mixed hyperlipidemia: Secondary | ICD-10-CM

## 2020-01-12 DIAGNOSIS — I10 Essential (primary) hypertension: Secondary | ICD-10-CM

## 2020-01-12 DIAGNOSIS — D508 Other iron deficiency anemias: Secondary | ICD-10-CM

## 2020-01-12 NOTE — Progress Notes (Signed)
Office Visit    Patient Name: Natalie Knox Date of Encounter: 01/12/2020  Primary Care Provider:  Derinda Late, MD Primary Cardiologist:  [name], MD  Chief Complaint    69 year old female with history of hypertension, hyperlipidemia, mild carotid arterial disease, iron deficiency anemia, and neurosyphilis, who presents for follow-up of hypertension.  Past Medical History    Past Medical History:  Diagnosis Date  . Allergic rhinitis   . Anemia   . Atypical chest pain    a. 05/2009 Ex MV: EF 67%, no ischemia/infarct; b. 12/2018 MV: No ischemia/infarct. Cor Ca2+ and Aortic atherosclerosis noted. EF 55-65%.  . Carotid arterial disease ([place])    a. 10/2019 Carotid U/S: 1-39% bilat ICA stenosis w/ <50% RECA and CCA stenoses.  . Colon polyp    a. 11/2018 Colonscopy: 75mm polyp removed. Otw nl study.  . Hard of hearing    has hearing aid  . Hyperlipidemia   . Hypertension   . Iron deficiency anemia    a. 11/2018 s/p 1u prbcs-->EGD w/ erythema in antrum of stomach but otw nl. Colonoscopy notable for 30mm polyp.  [name] LVH (left ventricular hypertrophy)    a. 11/2018 Echo: EF 60-65%, mod to sev LVH w/ near cavity obliteration in systole. Diast dysfxn. Nl RV size/fxn. Mildly elev PASP.  [name] Neurosyphilis    Past Surgical History:  Procedure Laterality Date  . COLONOSCOPY    . COLONOSCOPY N/A 12/22/2019   Procedure: COLONOSCOPY;  Surgeon: Virgel Manifold, MD;  Location: [facility] ENDOSCOPY;  Service: Endoscopy;  Laterality: N/A;  . ESOPHAGOGASTRODUODENOSCOPY N/A 12/21/2019   Procedure: ESOPHAGOGASTRODUODENOSCOPY (EGD);  Surgeon: Virgel Manifold, MD;  Location: [facility] ENDOSCOPY;  Service: Endoscopy;  Laterality: N/A;  . GIVENS CAPSULE STUDY N/A 12/22/2019   Procedure: GIVENS CAPSULE STUDY;  Surgeon: Virgel Manifold, MD;  Location: [facility] ENDOSCOPY;  Service: Endoscopy;  Laterality: N/A;  If colon finds nothing, needs a capsule endoscopy  . UPPER GI ENDOSCOPY       Allergies  No Known Allergies  History of Present Illness    69 year old female with the above past medical history including hypertension, hyperlipidemia, mild carotid arterial disease, iron deficiency anemia, and neurosyphilis.  She previously underwent cardiac evaluation with echocardiography in September 2020 showing normal LV function, grade 1 diastolic dysfunction, moderate to severe LVH, and mild pulmonary hypertension.  Stress testing performed October 2020 showed normal LV function without ischemia or infarct.  Coronary artery calcification and aortic atherosclerosis were noted.  She has been medically managed with beta-blocker, ACE inhibitor, diuretic, and statin therapy.  She was last seen in cardiology clinic in June of this year, at which time she was doing well.  Her blood pressure was elevated and HCTZ 12.5 mg daily was added.  Bilateral carotid bruits were noted on exam and carotid ultrasound was performed and showed 1-39 bilateral internal carotid artery stenosis.  She was also noted at that visit to have persistent lateral T wave inversions and low voltage on ECG.  It was felt that if patient developed symptomatic heart failure symptoms, cardiac MRI should be considered in the future to evaluate for infiltrative process.  In August, she contacted [place] to report lightheadedness and HCTZ was discontinued with resolution of symptoms.  Since her last visit, she notes that she has done well.  On routine labs in late September, she was found to have an H&H of 6.5 and 22.9.  She was hospitalized and received 1 unit of packed red blood cells.  She was treated with IV Venofer and also seen by GI with endoscopy (erythema in the antrum) and colonoscopy (6 mm polyp status post resection).  She followed up with GI as an outpatient with recommendation for capsule endoscopy, which she is currently considering.  She was referred to hematology as well.  Follow-up H&H October 19 was improved to  10.1/32.4.  She does not experience chest pain or dyspnea and denies palpitations, PND, orthopnea, dizziness, syncope, edema, or early satiety.  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  She does check her blood pressure at home sometimes and says it typically runs in the 130s over 60s.  She is 156/60 today and 160/60 on repeat.  Home Medications    Prior to Admission medications   Medication Sig Start Date End Date Taking? Authorizing Provider  amLODipine (NORVASC) 10 MG tablet Take 10 mg by mouth daily.    [provider]  carvedilol (COREG) 6.25 MG tablet Take 6.25 mg by mouth 2 (two) times daily with a meal.    [provider]  hydrochlorothiazide (MICROZIDE) 12.5 MG capsule Take 1 capsule (12.5 mg total) by mouth daily. 09/21/19 01/10/20  End, Harrell Gave, MD  lisinopril (PRINIVIL,ZESTRIL) 40 MG tablet Take 40 mg by mouth daily.    [provider]  pantoprazole (PROTONIX) 40 MG tablet Take 40 mg by mouth daily. 10/21/19   [provider]  pravastatin (PRAVACHOL) 40 MG tablet Take 40 mg by mouth daily.    [provider]  vitamin B-12 (CYANOCOBALAMIN) 1000 MCG tablet Take 1,000 mcg by mouth daily.    [provider]    Review of Systems    Overall doing well since her last visit.  She denies chest pain, palpitations, dyspnea, pnd, orthopnea, n, v, dizziness, syncope, edema, weight gain, or early satiety.  All other systems reviewed and are otherwise negative except as noted above.  Physical Exam    VS:  BP (!) 156/60 (BP Location: Left Arm, Patient Position: Sitting, Cuff Size: Normal)   Pulse 68   Ht 5\' 6"  (1.676 m)   Wt 185 lb (83.9 kg)   SpO2 96%   BMI 29.86 kg/m  , BMI Body mass index is 29.86 kg/m. GEN: Well nourished, well developed, in no acute distress. HEENT: normal. Neck: Supple, no JVD. Soft bilat carotid bruits vs radiated murmur, no masses. Cardiac: RRR, 2/6 SEM throughout, no rubs, or gallops.  No clubbing, cyanosis, edema.  Radials/PT 2+ and equal bilaterally.  Respiratory:  Respirations regular and unlabored, clear to auscultation bilaterally. GI: Soft, nontender, nondistended, BS + x 4. MS: no deformity or atrophy. Skin: warm and dry, no rash. Neuro:  Strength and sensation are intact. Psych: Normal affect.  Accessory Clinical Findings    Lab Results  Component Value Date   WBC 6.6 01/10/2020   HGB 10.1 (L) 01/10/2020   HCT 32.4 (L) 01/10/2020   MCV 79.8 (L) 01/10/2020   PLT 344 01/10/2020    Labs from [facility]-December 19, 2019:  Sodium 143, potassium 4.4, chloride 112, CO2 24.7, BUN 15, creatinine 1.0, glucose 116 Calcium 8.9, albumin 3.7, total protein 0.3 Total bilirubin 0.3, alkaline phosphatase 67, AST 12, ALT 9 Hemoglobin A1c 5.8 Total cholesterol 175, triglycerides 83, HDL 61.8, LDL 97  Assessment & Plan    1.  Essential hypertension: Blood pressure is elevated today at 156/60.  I did repeat this and got 160/60.  She says that at home, her blood pressure  typically is in the 130/60 range and believes her pressure might be up today because she had to walk such a long distance to get over here.  We discussed potentially increasing her carvedilol to 12.5 mg twice daily however, she prefers to monitor her blood pressure over the next 2 weeks and submit trends to [place] prior to adjusting medications, which I think is reasonable.  In addition to carvedilol, she remains on amlodipine and lisinopril.  She was previously on HCTZ but this caused lightheadedness and was subsequently discontinued.  2.  LVH/Abnl ECG:  Asymptomatic.  Consider cardiac MRI in the future to rule out infiltrative disease if she develops heart failure symptoms.  3.  Mild carotid arterial disease: Patient with soft bilateral carotid bruit versus radiated murmur.  Carotid ultrasound following last visit showed 1 to 39% bilateral ICA stenosis.  She remains on statin therapy.  4.   Hyperlipidemia: Continue statin therapy.  Recent LDL of 97 with normal LFTs.  5.  Iron deficiency anemia: Hospitalized in September in the setting of a hemoglobin of 6.5.  She received 1 unit of blood and IV Venofer.  EGD notable for erythema in the antrum.  Colonoscopy with 6 mm polyp that was removed.  No active bleeding noted.  She has followed up with GI with recommendation for capsule endoscopy, which she is considering.  She was also referred to hematology.  Hemoglobin 10.1 on October 19.    6.  Disposition: Patient will contact us with blood pressures in approximately 2 weeks.  Otherwise plan to follow-up in 4 months or sooner if necessary.  [name], NP 01/12/2020, 10:34 AM

## 2020-01-12 NOTE — Patient Instructions (Signed)
Medication Instructions:  Your physician recommends that you continue on your current medications as directed. Please refer to the Current Medication list given to you today.  *If you need a refill on your cardiac medications before your next appointment, please call your pharmacy*  Follow-Up: At [facility], you and your health needs are our priority.  As part of our continuing mission to provide you with exceptional heart care, we have created designated Provider Care Teams.  These Care Teams include your primary Cardiologist (physician) and Advanced Practice Providers (APPs -  Physician Assistants and Nurse Practitioners) who all work together to provide you with the care you need, when you need it.  We recommend signing up for the patient portal called "MyChart".  Sign up information is provided on this After Visit Summary.  MyChart is used to connect with patients for Virtual Visits (Telemedicine).  Patients are able to view lab/test results, encounter notes, upcoming appointments, etc.  Non-urgent messages can be sent to your provider as well.   To learn more about what you can do with MyChart, go to [email or web address].    Your next appointment:   4-6 month(s)  The format for your next appointment:   In Person  Provider:   You may see [name], MD or one of the following Advanced Practice Providers on your designated Care Team:    [name], NP  [name], PA-C  [name], PA-C  Cadence Kathlen Mody, [place]   Other Instructions Check your blood pressure and heart rate at home over the next 2 weeks. We recommend taking your blood pressure once a day about 2 hours after your blood pressure medications. Call us in 2 weeks or MyChart message with the readings.

## 2020-01-19 ENCOUNTER — Other Ambulatory Visit: Payer: Self-pay

## 2020-01-19 ENCOUNTER — Inpatient Hospital Stay: Payer: Medicare HMO

## 2020-01-19 VITALS — BP 132/72 | HR 70 | Resp 18

## 2020-01-19 DIAGNOSIS — D508 Other iron deficiency anemias: Secondary | ICD-10-CM

## 2020-01-19 DIAGNOSIS — Z87891 Personal history of nicotine dependence: Secondary | ICD-10-CM | POA: Diagnosis not present

## 2020-01-19 DIAGNOSIS — N1831 Chronic kidney disease, stage 3a: Secondary | ICD-10-CM | POA: Diagnosis not present

## 2020-01-19 DIAGNOSIS — E785 Hyperlipidemia, unspecified: Secondary | ICD-10-CM | POA: Diagnosis not present

## 2020-01-19 DIAGNOSIS — D509 Iron deficiency anemia, unspecified: Secondary | ICD-10-CM | POA: Diagnosis not present

## 2020-01-19 DIAGNOSIS — Z803 Family history of malignant neoplasm of breast: Secondary | ICD-10-CM | POA: Diagnosis not present

## 2020-01-19 DIAGNOSIS — Z8249 Family history of ischemic heart disease and other diseases of the circulatory system: Secondary | ICD-10-CM | POA: Diagnosis not present

## 2020-01-19 DIAGNOSIS — Z79899 Other long term (current) drug therapy: Secondary | ICD-10-CM | POA: Diagnosis not present

## 2020-01-19 DIAGNOSIS — K5900 Constipation, unspecified: Secondary | ICD-10-CM | POA: Diagnosis not present

## 2020-01-19 MED ORDER — SODIUM CHLORIDE 0.9 % IV SOLN
Freq: Once | INTRAVENOUS | Status: AC
  Filled 2020-01-19: qty 250

## 2020-01-19 MED ORDER — SODIUM CHLORIDE 0.9 % IV SOLN
510.0000 mg | Freq: Once | INTRAVENOUS | Status: AC
  Administered 2020-01-19: 510 mg via INTRAVENOUS
  Filled 2020-01-19: qty 510

## 2020-01-25 ENCOUNTER — Telehealth: Payer: Self-pay | Admitting: Internal Medicine

## 2020-01-25 DIAGNOSIS — Z79899 Other long term (current) drug therapy: Secondary | ICD-10-CM

## 2020-01-25 DIAGNOSIS — I10 Essential (primary) hypertension: Secondary | ICD-10-CM

## 2020-01-25 NOTE — Telephone Encounter (Signed)
Called patient to see what her heart rates have been running. She had the following: 10/28 61 10/29 67 10/30 65  Routing Natalie Bayley, NP to review.

## 2020-01-25 NOTE — Telephone Encounter (Signed)
Patient reporting bp log per last ov   10-25  129/76  10-26  142/73  10-27  140/62  10-28  147/78  10-29  142/90  10-30  145/76  10-31  142/76

## 2020-01-26 ENCOUNTER — Inpatient Hospital Stay: Payer: Medicare HMO | Attending: Oncology

## 2020-01-26 VITALS — BP 140/57 | HR 59 | Temp 96.2°F | Resp 18

## 2020-01-26 DIAGNOSIS — D509 Iron deficiency anemia, unspecified: Secondary | ICD-10-CM | POA: Insufficient documentation

## 2020-01-26 DIAGNOSIS — D508 Other iron deficiency anemias: Secondary | ICD-10-CM

## 2020-01-26 MED ORDER — SODIUM CHLORIDE 0.9 % IV SOLN
Freq: Once | INTRAVENOUS | Status: AC
  Filled 2020-01-26: qty 250

## 2020-01-26 MED ORDER — SODIUM CHLORIDE 0.9 % IV SOLN
510.0000 mg | Freq: Once | INTRAVENOUS | Status: AC
  Administered 2020-01-26: 510 mg via INTRAVENOUS
  Filled 2020-01-26: qty 510

## 2020-01-26 NOTE — Progress Notes (Signed)
1513- Patient tolerated Feraheme infusion well. Patient discharged to home at this time.

## 2020-01-26 NOTE — Telephone Encounter (Signed)
I think it is fine to add spironolactone 25 mg daily with BMP in 1 week.  [name], MD [facility]

## 2020-01-26 NOTE — Telephone Encounter (Signed)
No answer. Left message to call back.   

## 2020-01-27 MED ORDER — SPIRONOLACTONE 25 MG PO TABS: 25.0000 mg | ORAL_TABLET | Freq: Every day | ORAL | 1 refills | Status: DC

## 2020-01-27 NOTE — Telephone Encounter (Signed)
Patient calling back. She verbalized understanding to start spironolactone 25 mg once a day. She requested it be sent to [facility] order because she does not have to pay for it and they will deliver it to her house. She wrote down that she will need blood work at [facility] 1 week after starting this new medication. She is aware to continue her other medications as well.  Rx sent to pharmacy.

## 2020-01-27 NOTE — Telephone Encounter (Signed)
No answer. Left message to call back.   

## 2020-01-30 ENCOUNTER — Other Ambulatory Visit: Admission: RE | Admit: 2020-01-30 | Payer: Medicare HMO | Source: Ambulatory Visit

## 2020-01-30 ENCOUNTER — Telehealth: Payer: Self-pay | Admitting: *Deleted

## 2020-01-30 NOTE — Telephone Encounter (Signed)
Received fax from [place] for clarification needed message for review: Rxs for spironolactone 25 mg tab and lisinopril 40 mg tab. These drugs may cause hyperkalemia. Please confirm that you are aware of this interaction and ok to fill this combination yes or no.   Paper placed on Dr Darnelle Bos desk to sign and then be faxed back to [place].

## 2020-01-31 ENCOUNTER — Telehealth: Payer: Self-pay

## 2020-01-31 NOTE — Telephone Encounter (Signed)
Dr End signed with prescriber response: "Potassium will be monitored closely. Use of ACEI and spironolactone is standard of care for conditions such as heart failure, hyperaldosteronism and resistant hypertension.  Faxed successfully to [phone].

## 2020-01-31 NOTE — Telephone Encounter (Signed)
Hi [place],  I put the paper copy on Dr Darnelle Bos desk to sign yesterday afternoon. I haven't checked there yet but it should be ready soon.

## 2020-01-31 NOTE — Telephone Encounter (Signed)
Incoming fax from [facility]   "Rxs for Spironolactone 25MG  and Lisinopril 40MG . These drugs may cause Hyperkalemia.  Please confirm that you are aware of this interaction and okay to fill this combination."

## 2020-02-01 ENCOUNTER — Ambulatory Visit
Admission: RE | Admit: 2020-02-01 | Discharge: 2020-02-01 | Disposition: A | Discharge status: Home / Self Care | Payer: Medicare HMO | Attending: Gastroenterology | Admitting: Gastroenterology

## 2020-02-01 ENCOUNTER — Encounter
Admission: RE | Disposition: A | Discharge status: Home / Self Care | Payer: Self-pay | Source: Home / Self Care | Attending: Gastroenterology

## 2020-02-01 ENCOUNTER — Encounter: Payer: Self-pay | Admitting: Anesthesiology

## 2020-02-01 ENCOUNTER — Other Ambulatory Visit: Payer: Self-pay

## 2020-02-01 DIAGNOSIS — I890 Lymphedema, not elsewhere classified: Secondary | ICD-10-CM | POA: Diagnosis not present

## 2020-02-01 DIAGNOSIS — D509 Iron deficiency anemia, unspecified: Secondary | ICD-10-CM | POA: Diagnosis not present

## 2020-02-01 HISTORY — PX: GIVENS CAPSULE STUDY: SHX5432

## 2020-02-01 SURGERY — IMAGING PROCEDURE, GI TRACT, INTRALUMINAL, VIA CAPSULE
Anesthesia: General

## 2020-02-02 ENCOUNTER — Encounter: Payer: Self-pay | Admitting: Gastroenterology

## 2020-02-03 NOTE — Telephone Encounter (Signed)
Patient returning call about bp medication.

## 2020-02-03 NOTE — Telephone Encounter (Signed)
Patient said she had not received the spironolactone from [facility] yet. I let her know if took a few days because Dr End had to approve it. Advised it may take a few more days than usual for her to get it. Asked her to call [employer] to check on it and call me back if they still haven't sent her the medication. She is aware to go to the [facility] in 1 week from starting the medication for lab work.

## 2020-02-08 ENCOUNTER — Telehealth (INDEPENDENT_AMBULATORY_CARE_PROVIDER_SITE_OTHER): Payer: Medicare HMO | Admitting: Gastroenterology

## 2020-02-08 ENCOUNTER — Telehealth: Payer: Self-pay

## 2020-02-08 ENCOUNTER — Encounter: Payer: Self-pay | Admitting: Gastroenterology

## 2020-02-08 DIAGNOSIS — D509 Iron deficiency anemia, unspecified: Secondary | ICD-10-CM | POA: Diagnosis not present

## 2020-02-08 DIAGNOSIS — K319 Disease of stomach and duodenum, unspecified: Secondary | ICD-10-CM | POA: Diagnosis not present

## 2020-02-08 NOTE — Progress Notes (Signed)
Natalie Antigua, MD [address]  [place]  [place], [place] [address]  Main: [phone]  Fax: [phone]   Primary Care Physician: Derinda Late, MD  Virtual Visit via Telephone Note  I connected with patient on 02/08/20 at  3:30 PM EST by telephone and verified that I am speaking with the correct person using two identifiers.   I discussed the limitations, risks, security and privacy concerns of performing an evaluation and management service by telephone and the availability of in person appointments. I also discussed with the patient that there may be a patient responsible charge related to this service. The patient expressed understanding and agreed to proceed.  Location of Patient: Home Location of Provider: Home Persons involved: Patient and provider only during the visit (nursing staff and front desk staff was involved in communicating with the patient prior to the appointment, reviewing medications and checking them in)   History of Present Illness: Chief Complaint  Patient presents with  . Iron deficiency anemia    Patient stated that she had been doing well with no complaints.     HPI: Natalie Knox is a 69 y.o. female here for follow-up of iron deficiency anemia. The patient denies abdominal or flank pain, anorexia, nausea or vomiting, dysphagia, change in bowel habits or black or bloody stools or weight loss.  Small bowel capsule study has been read and sent for scanning.  Did not show any etiology of her iron deficiency.  Most recent hemoglobin on October 19 showed significant improvement from before.  Patient continues to be on iron replacement by hematology.  Current Outpatient Medications  Medication Sig Dispense Refill  . amLODipine (NORVASC) 10 MG tablet Take 10 mg by mouth daily.    . carvedilol (COREG) 6.25 MG tablet Take 6.25 mg by mouth 2 (two) times daily with a meal.    . lisinopril (PRINIVIL,ZESTRIL) 40 MG tablet Take 40 mg by mouth  daily.    . pantoprazole (PROTONIX) 40 MG tablet Take 40 mg by mouth daily.    . pravastatin (PRAVACHOL) 40 MG tablet Take 40 mg by mouth daily.    [name] spironolactone (ALDACTONE) 25 MG tablet Take 1 tablet (25 mg total) by mouth daily. 90 tablet 1  . vitamin B-12 (CYANOCOBALAMIN) 1000 MCG tablet Take 1,000 mcg by mouth daily.     No current facility-administered medications for this visit.    Allergies as of 02/08/2020  . (No Known Allergies)    Review of Systems:    All systems reviewed and negative except where noted in HPI.   Observations/Objective:  Labs: CMP     Component Value Date/Time   NA 141 12/21/2019 0423   NA 146 (H) 07/08/2018 1117   K 3.9 12/21/2019 0423   CL 111 12/21/2019 0423   CO2 22 12/21/2019 0423   GLUCOSE 96 12/21/2019 0423   BUN 18 12/21/2019 0423   BUN 33 (H) 07/08/2018 1117   CREATININE 1.12 (H) 12/21/2019 0423   CALCIUM 8.7 (L) 12/21/2019 0423   PROT 7.0 12/02/2018 0909   PROT 7.0 07/08/2018 1117   ALBUMIN 4.0 12/02/2018 0909   ALBUMIN 4.4 07/08/2018 1117   AST 21 12/02/2018 0909   ALT 19 12/02/2018 0909   ALKPHOS 75 12/02/2018 0909   BILITOT 0.5 12/02/2018 0909   BILITOT 0.3 07/08/2018 1117   GFRNONAA 49 (L) 12/21/2019 0423   GFRAA 57 (L) 12/21/2019 0423   Lab Results  Component Value Date   WBC 6.6 01/10/2020  HGB 10.1 (L) 01/10/2020   HCT 32.4 (L) 01/10/2020   MCV 79.8 (L) 01/10/2020   PLT 344 01/10/2020    Imaging Studies: No results found.  Assessment and Plan:   [name] is a 69 y.o. y/o female here for follow-up of iron deficiency anemia with unrevealing work-up including EGD, colonoscopy, small bowel capsule study  Assessment and Plan: Hemoglobin has improved with iron replacement Continue close follow-up with hematology  Due to gastric erythema seen on upper endoscopy, will obtain H. pylori breath test  Patient has Protonix listed as a historical med and states she has not been taking it, therefore appropriate  to proceed with breath test  Repeat colonoscopy recommended in September 2022 with 2-day prep.  This was set under health maintenance by clinic staff as well  Follow-up in 6 to 12 months in our clinic or earlier if symptoms change, which was discussed with the patient  Follow Up Instructions:    I discussed the assessment and treatment plan with the patient. The patient was provided an opportunity to ask questions and all were answered. The patient agreed with the plan and demonstrated an understanding of the instructions.   The patient was advised to call back or seek an in-person evaluation if the symptoms worsen or if the condition fails to improve as anticipated.  I provided 12 minutes of non-face-to-face time during this encounter. Additional time was spent in reviewing patient's chart, placing orders etc.   Virgel Manifold, MD  Speech recognition software was used to dictate this note.

## 2020-02-08 NOTE — Telephone Encounter (Signed)
Called patient to let her know that Dr. Bonna Gains would like for her to do a H Pylori breath test and that she could go to any [facility] location as the order is already in the system and/or come to our office and have it done on the days and times that we have a lab tech.

## 2020-03-12 ENCOUNTER — Inpatient Hospital Stay (HOSPITAL_BASED_OUTPATIENT_CLINIC_OR_DEPARTMENT_OTHER): Payer: Medicare HMO | Admitting: Oncology

## 2020-03-12 ENCOUNTER — Inpatient Hospital Stay: Payer: Medicare HMO | Attending: Oncology

## 2020-03-12 ENCOUNTER — Other Ambulatory Visit: Payer: Self-pay

## 2020-03-12 ENCOUNTER — Encounter: Payer: Self-pay | Admitting: Oncology

## 2020-03-12 VITALS — BP 114/68 | HR 50 | Temp 97.3°F | Resp 16 | Wt 182.4 lb

## 2020-03-12 DIAGNOSIS — E785 Hyperlipidemia, unspecified: Secondary | ICD-10-CM | POA: Insufficient documentation

## 2020-03-12 DIAGNOSIS — Z79899 Other long term (current) drug therapy: Secondary | ICD-10-CM | POA: Insufficient documentation

## 2020-03-12 DIAGNOSIS — Z87891 Personal history of nicotine dependence: Secondary | ICD-10-CM | POA: Insufficient documentation

## 2020-03-12 DIAGNOSIS — D509 Iron deficiency anemia, unspecified: Secondary | ICD-10-CM

## 2020-03-12 DIAGNOSIS — Z803 Family history of malignant neoplasm of breast: Secondary | ICD-10-CM | POA: Diagnosis not present

## 2020-03-12 DIAGNOSIS — I10 Essential (primary) hypertension: Secondary | ICD-10-CM | POA: Insufficient documentation

## 2020-03-12 DIAGNOSIS — Z8249 Family history of ischemic heart disease and other diseases of the circulatory system: Secondary | ICD-10-CM | POA: Diagnosis not present

## 2020-03-12 DIAGNOSIS — D508 Other iron deficiency anemias: Secondary | ICD-10-CM | POA: Diagnosis not present

## 2020-03-12 LAB — IRON AND TIBC
Iron: 91 ug/dL (ref 28–170)
Saturation Ratios: 34 % — ABNORMAL HIGH (ref 10.4–31.8)
TIBC: 272 ug/dL (ref 250–450)
UIBC: 181 ug/dL

## 2020-03-12 LAB — CBC WITH DIFFERENTIAL/PLATELET
Abs Immature Granulocytes: 0.02 10*3/uL (ref 0.00–0.07)
Basophils Absolute: 0 10*3/uL (ref 0.0–0.1)
Basophils Relative: 1 %
Eosinophils Absolute: 0.1 10*3/uL (ref 0.0–0.5)
Eosinophils Relative: 2 %
HCT: 36.6 % (ref 36.0–46.0)
Hemoglobin: 12.1 g/dL (ref 12.0–15.0)
Immature Granulocytes: 0 %
Lymphocytes Relative: 25 %
Lymphs Abs: 1.4 10*3/uL (ref 0.7–4.0)
MCH: 27.8 pg (ref 26.0–34.0)
MCHC: 33.1 g/dL (ref 30.0–36.0)
MCV: 84.1 fL (ref 80.0–100.0)
Monocytes Absolute: 0.5 10*3/uL (ref 0.1–1.0)
Monocytes Relative: 8 %
Neutro Abs: 3.5 10*3/uL (ref 1.7–7.7)
Neutrophils Relative %: 64 %
Platelets: 236 10*3/uL (ref 150–400)
RBC: 4.35 MIL/uL (ref 3.87–5.11)
RDW: 25.1 % — ABNORMAL HIGH (ref 11.5–15.5)
WBC: 5.6 10*3/uL (ref 4.0–10.5)
nRBC: 0 % (ref 0.0–0.2)

## 2020-03-12 LAB — FERRITIN: Ferritin: 249 ng/mL (ref 11–307)

## 2020-03-12 NOTE — Progress Notes (Signed)
Patient denies new problems/concerns today.   °

## 2020-03-13 ENCOUNTER — Other Ambulatory Visit: Payer: Medicare HMO

## 2020-03-13 ENCOUNTER — Ambulatory Visit: Payer: Medicare HMO | Admitting: Oncology

## 2020-03-13 NOTE — Progress Notes (Signed)
Hematology/Oncology Consult note [facility]  [phone] [phone]  Patient Care Team: Derinda Late, MD as PCP - General (Family Medicine) End, Harrell Gave, MD as PCP - Cardiology (Cardiology)   Name of the patient: Natalie Knox  [id number]  [birth year]   Date of visit: 03/13/20  Diagnosis-iron deficiency anemia  Chief complaint/ Reason for visit-routine follow-up of iron deficiency anemia  Heme/Onc history: patient is a 69 year old African-American female With a past medical history significant for hypertension hyperlipidemia was recently admitted to the hospital for acute blood loss anemia.  She had EGD and colonoscopy showed erythema in the antrum of the stomach but did not reveal any cause of bleeding.  She has not yet completed her capsule study.  She was found to have a hemoglobin of 6.5 on admission with iron studies indicative of iron deficiency.  Patient was given 1 unit of blood transfusion and 1 dose of Venofer.  Her hemoglobin on discharge was 8.6.  Patient currently reports some fatigue.  She is hard of hearing.  Denies any blood loss in her stool and urine.  Denies any vaginal bleeding   Interval history-patient reports that her energy levels have improved after receiving IV iron.  She still has fatigue but is better than it was before  ECOG PS- 1 Pain scale- 0   Review of systems- Review of Systems  Constitutional: Positive for malaise/fatigue. Negative for chills, fever and weight loss.  HENT: Negative for congestion, ear discharge and nosebleeds.   Eyes: Negative for blurred vision.  Respiratory: Negative for cough, hemoptysis, sputum production, shortness of breath and wheezing.   Cardiovascular: Negative for chest pain, palpitations, orthopnea and claudication.  Gastrointestinal: Negative for abdominal pain, blood in stool, constipation, diarrhea, heartburn, melena, nausea and vomiting.  Genitourinary: Negative  for dysuria, flank pain, frequency, hematuria and urgency.  Musculoskeletal: Negative for back pain, joint pain and myalgias.  Skin: Negative for rash.  Neurological: Negative for dizziness, tingling, focal weakness, seizures, weakness and headaches.  Endo/Heme/Allergies: Does not bruise/bleed easily.  Psychiatric/Behavioral: Negative for depression and suicidal ideas. The patient does not have insomnia.        No Known Allergies   Past Medical History:  Diagnosis Date  . Allergic rhinitis   . Anemia   . Atypical chest pain    a. 05/2009 Ex MV: EF 67%, no ischemia/infarct; b. 12/2018 MV: No ischemia/infarct. Cor Ca2+ and Aortic atherosclerosis noted. EF 55-65%.  . Carotid arterial disease ([place])    a. 10/2019 Carotid U/S: 1-39% bilat ICA stenosis w/ <50% RECA and CCA stenoses.  . Colon polyp    a. 11/2018 Colonscopy: 29mm polyp removed. Otw nl study.  . Hard of hearing    has hearing aid  . Hyperlipidemia   . Hypertension   . Iron deficiency anemia    a. 11/2018 s/p 1u prbcs-->EGD w/ erythema in antrum of stomach but otw nl. Colonoscopy notable for 36mm polyp.  [name] LVH (left ventricular hypertrophy)    a. 11/2018 Echo: EF 60-65%, mod to sev LVH w/ near cavity obliteration in systole. Diast dysfxn. Nl RV size/fxn. Mildly elev PASP.  [name] Neurosyphilis      Past Surgical History:  Procedure Laterality Date  . COLONOSCOPY    . COLONOSCOPY N/A 12/22/2019   Procedure: COLONOSCOPY;  Surgeon: Virgel Manifold, MD;  Location: [facility] ENDOSCOPY;  Service: Endoscopy;  Laterality: N/A;  . ESOPHAGOGASTRODUODENOSCOPY N/A 12/21/2019   Procedure: ESOPHAGOGASTRODUODENOSCOPY (EGD);  Surgeon: Virgel Manifold, MD;  Location: [facility] ENDOSCOPY;  Service: Endoscopy;  Laterality: N/A;  . GIVENS CAPSULE STUDY N/A 12/22/2019   Procedure: GIVENS CAPSULE STUDY;  Surgeon: Virgel Manifold, MD;  Location: [facility] ENDOSCOPY;  Service: Endoscopy;  Laterality: N/A;  If colon finds nothing, needs a capsule endoscopy   . GIVENS CAPSULE STUDY N/A 02/01/2020   Procedure: GIVENS CAPSULE STUDY;  Surgeon: Virgel Manifold, MD;  Location: [facility] ENDOSCOPY;  Service: Endoscopy;  Laterality: N/A;  . UPPER GI ENDOSCOPY      Social History   Socioeconomic History  . Marital status: Single    Spouse name: Not on file  . Number of children: Not on file  . Years of education: Not on file  . Highest education level: Not on file  Occupational History  . Not on file  Tobacco Use  . Smoking status: Former Smoker    Packs/day: 0.50    Years: 25.00    Pack years: 12.50    Types: Cigarettes    Quit date: 05/2017    Years since quitting: 2.8  . Smokeless tobacco: Never Used  Vaping Use  . Vaping Use: Never used  Substance and Sexual Activity  . Alcohol use: Not Currently    Comment: no drink since march  . Drug use: No  . Sexual activity: Not on file  Other Topics Concern  . Not on file  Social History Narrative  . Not on file   Social Determinants of Health   Financial Resource Strain: Not on file  Food Insecurity: Not on file  Transportation Needs: Not on file  Physical Activity: Not on file  Stress: Not on file  Social Connections: Not on file  Intimate Partner Violence: Not on file    Family History  Problem Relation Age of Onset  . Breast cancer Sister   . Heart attack Mother 75     Current Outpatient Medications:  .  amLODipine (NORVASC) 10 MG tablet, Take 10 mg by mouth daily., Disp: , Rfl:  .  carvedilol (COREG) 6.25 MG tablet, Take 6.25 mg by mouth 2 (two) times daily with a meal., Disp: , Rfl:  .  donepezil (ARICEPT) 5 MG tablet, Take 5 mg by mouth at bedtime., Disp: , Rfl:  .  lisinopril (PRINIVIL,ZESTRIL) 40 MG tablet, Take 40 mg by mouth daily., Disp: , Rfl:  .  pantoprazole (PROTONIX) 40 MG tablet, Take 40 mg by mouth daily., Disp: , Rfl:  .  pravastatin (PRAVACHOL) 40 MG tablet, Take 40 mg by mouth daily., Disp: , Rfl:  .  spironolactone (ALDACTONE) 25 MG tablet, Take 1  tablet (25 mg total) by mouth daily., Disp: 90 tablet, Rfl: 1 .  vitamin B-12 (CYANOCOBALAMIN) 1000 MCG tablet, Take 1,000 mcg by mouth daily., Disp: , Rfl:   Physical exam:  Vitals:   03/12/20 1119  BP: 114/68  Pulse: (!) 50  Resp: 16  Temp: (!) 97.3 F (36.3 C)  Weight: 182 lb 6.4 oz (82.7 kg)   Physical Exam Constitutional:      General: She is not in acute distress. Eyes:     Extraocular Movements: EOM normal.  Cardiovascular:     Rate and Rhythm: Normal rate and regular rhythm.     Heart sounds: Normal heart sounds.  Pulmonary:     Effort: Pulmonary effort is normal.     Breath sounds: Normal breath sounds.  Skin:    General: Skin is warm and dry.  Neurological:     Mental Status: She is alert  and oriented to person, place, and time.      CMP Latest Ref Rng & Units 12/21/2019  Glucose 70 - 99 mg/dL 96  BUN 8 - 23 mg/dL 18  Creatinine 0.44 - 1.00 mg/dL 1.12(H)  Sodium 135 - 145 mmol/L 141  Potassium 3.5 - 5.1 mmol/L 3.9  Chloride 98 - 111 mmol/L 111  CO2 22 - 32 mmol/L 22  Calcium 8.9 - 10.3 mg/dL 8.7(L)  Total Protein 6.5 - 8.1 g/dL -  Total Bilirubin 0.3 - 1.2 mg/dL -  Alkaline Phos 38 - 126 U/L -  AST 15 - 41 U/L -  ALT 0 - 44 U/L -   CBC Latest Ref Rng & Units 03/12/2020  WBC 4.0 - 10.5 K/uL 5.6  Hemoglobin 12.0 - 15.0 g/dL 12.1  Hematocrit 36.0 - 46.0 % 36.6  Platelets 150 - 400 K/uL 236      Assessment and plan- Patient is a 69 y.o. female with history of iron deficiency anemia here for routine follow-up  Patient's hemoglobin is improved to 12.1 from a prior value of 10 after receiving IV iron.  Her iron studies are presently normal and she does not require any IV iron at this time.  Repeat CBC ferritin and iron studies in 3 in 6 months and I will see her back in 6 months   Visit Diagnosis 1. Other iron deficiency anemia   2. Iron deficiency anemia, unspecified iron deficiency anemia type      Dr. Randa Evens, MD, MPH [facility] [phone] 03/13/2020 8:32 AM

## 2020-05-23 ENCOUNTER — Encounter: Payer: Self-pay | Admitting: Internal Medicine

## 2020-05-23 ENCOUNTER — Ambulatory Visit: Payer: Medicare HMO | Admitting: Internal Medicine

## 2020-05-23 ENCOUNTER — Ambulatory Visit (INDEPENDENT_AMBULATORY_CARE_PROVIDER_SITE_OTHER): Payer: Medicare Other | Admitting: Internal Medicine

## 2020-05-23 ENCOUNTER — Other Ambulatory Visit: Payer: Self-pay

## 2020-05-23 VITALS — BP 130/54 | HR 54 | Ht 66.0 in | Wt 185.0 lb

## 2020-05-23 DIAGNOSIS — I119 Hypertensive heart disease without heart failure: Secondary | ICD-10-CM

## 2020-05-23 DIAGNOSIS — E785 Hyperlipidemia, unspecified: Secondary | ICD-10-CM

## 2020-05-23 DIAGNOSIS — I517 Cardiomegaly: Secondary | ICD-10-CM

## 2020-05-23 DIAGNOSIS — I10 Essential (primary) hypertension: Secondary | ICD-10-CM

## 2020-05-23 DIAGNOSIS — I429 Cardiomyopathy, unspecified: Secondary | ICD-10-CM | POA: Diagnosis not present

## 2020-05-23 NOTE — Progress Notes (Signed)
Follow-up Outpatient Visit Date: 05/23/2020  Primary Care Provider: Derinda Late, MD [phone] [facility] [place] [address]  Chief Complaint: Follow-up hypertension and LVH  HPI:  Natalie Knox is a 70 y.o. female with history of hypertension, hyperlipidemia, and neurosyphilis, who presents for follow-up of hypertension. She was last seen in our office in 12/2019 by Ignacia Bayley, NP, at which time she was doing well though she was incidentally noted to have severe anemia in 11/2019 with a hemoglobin of 6.5. She received PRBC x1. Erythema was noted in the gastric antrum. A 6 mm polyp was also noted on colonoscopy. No clear cause for her significant anemia was identified, however. Subsequent capsule endoscopy showed no clear cause for severe iron deficiency anemia. Most recent hemoglobin in late December was 12.1.  Today, Natalie Knox reports feeling better with normalization of her hemoglobin, though she still has some exertional dyspnea.  She denies shortness of breath at rest and with normal activities, as well as chest pain, palpitations, lightheadedness, and edema.  Her BP has been generally well-controlled, though she reports a transient spike when she missed her BP medications for a few days.  She is tolerating her medications well.  She complains of difficulty sleeping at times.  --------------------------------------------------------------------------------------------------  Cardiovascular History & Procedures: Cardiovascular Problems:  Heart murmur  Pericardial effusion  Coronary artery calcification  Risk Factors:  Hypertension, prior tobacco use,and age greater than 46  Cath/PCI:  None  CV Surgery:  None  EP Procedures and Devices:  None  Non-Invasive Evaluation(s):  Pharmacologic MPI (12/28/2018): Normal study without ischemia or scar. LVEF 55-65%. Coronary artery calcification and aortic atherosclerosis  noted.  TTE (12/09/2018): Technically difficult study. Normal LV size with moderate-severe LVH. LVEF 60-65%. Grade 1 diastolic dysfunction. Normal live RV size and wall thickness. Mild pulmonary hypertension. Small pericardial effusion.   Recent CV Pertinent Labs: Lab Results  Component Value Date   CHOL 198 07/08/2018   HDL 75 07/08/2018   LDLCALC 110 (H) 07/08/2018   TRIG 66 07/08/2018   CHOLHDL 2.6 07/08/2018   K 4.7 05/23/2020   BUN 19 05/23/2020   CREATININE 1.02 (H) 05/23/2020    Past medical and surgical history were reviewed and updated in EPIC.  Current Meds  Medication Sig  . amLODipine (NORVASC) 10 MG tablet Take 10 mg by mouth daily.  . carvedilol (COREG) 6.25 MG tablet Take 6.25 mg by mouth 2 (two) times daily with a meal.  . donepezil (ARICEPT) 5 MG tablet Take 5 mg by mouth at bedtime.  [name] lisinopril (PRINIVIL,ZESTRIL) 40 MG tablet Take 40 mg by mouth daily.  . pantoprazole (PROTONIX) 40 MG tablet Take 40 mg by mouth daily.  . pravastatin (PRAVACHOL) 40 MG tablet Take 40 mg by mouth daily.  [name] spironolactone (ALDACTONE) 25 MG tablet Take 1 tablet (25 mg total) by mouth daily.  . vitamin B-12 (CYANOCOBALAMIN) 1000 MCG tablet Take 1,000 mcg by mouth daily.    Allergies: Patient has no known allergies.  Social History   Tobacco Use  . Smoking status: Former Smoker    Packs/day: 0.50    Years: 25.00    Pack years: 12.50    Types: Cigarettes    Quit date: 05/2017    Years since quitting: 3.0  . Smokeless tobacco: Never Used  Vaping Use  . Vaping Use: Never used  Substance Use Topics  . Alcohol use: Not Currently    Comment: no drink since  march  . Drug use: No    Family History  Problem Relation Age of Onset  . Breast cancer Sister   . Heart attack Mother 88    Review of Systems: A 12-system review of systems was performed and was negative except as noted in the  HPI.  --------------------------------------------------------------------------------------------------  Physical Exam: BP (!) 130/54 (BP Location: Left Arm, Patient Position: Sitting, Cuff Size: Normal)   Pulse (!) 54   Ht 5\' 6"  (1.676 m)   Wt 185 lb (83.9 kg)   SpO2 98%   BMI 29.86 kg/m   General:  NAD. Neck: No JVD or HJR. Lungs: Clear to auscultation bilaterally without wheezes or crackles. Heart: Bradycardic but regular with 2/6 systolic murmur.  No rubs or gallops. Abdomen: Soft, nontender, nondistended. Extremities: No lower extremity edema.  EKG:  Sinus bradycardia (HR 54 bpm) with left atrial enlargement, low voltage, and lateral T wave inversions.  No significant change since prior tracing on 09/21/2019.  Lab Results  Component Value Date   WBC 5.6 03/12/2020   HGB 12.1 03/12/2020   HCT 36.6 03/12/2020   MCV 84.1 03/12/2020   PLT 236 03/12/2020    Lab Results  Component Value Date   NA 143 05/23/2020   K 4.7 05/23/2020   CL 109 (H) 05/23/2020   CO2 17 (L) 05/23/2020   BUN 19 05/23/2020   CREATININE 1.02 (H) 05/23/2020   GLUCOSE 79 05/23/2020   ALT 19 12/02/2018    Lab Results  Component Value Date   CHOL 198 07/08/2018   HDL 75 07/08/2018   LDLCALC 110 (H) 07/08/2018   TRIG 66 07/08/2018   CHOLHDL 2.6 07/08/2018    --------------------------------------------------------------------------------------------------  ASSESSMENT AND PLAN: Hypertensive heart disease, LVH, and cardiomyopathy: Blood pressure well-controlled today.  We discussed prior echo findings of severe LVH and low voltage on EKG that can be seen with infiltrative process.  I have agreed to proceed with cardiac MRI for further evaluation.  Iron deficiency anemia: No clear cause identified though hemoglobin much improved with iron therapy.  Ongoing management per hematology and GI.  Hyperlipidemia: LDL 97 on last check by Dr. Baldemar Lenis in 11/2019.  Continue statin therapy for LDL < 100  given mild carotid artery plaquing, preferably < 70.  Follow-up: Return to clinic in 6 months.  [name], MD 05/24/2020 8:34 PM

## 2020-05-23 NOTE — Patient Instructions (Signed)
You may receive a call from one of our Social Workers to help set up transportation to MRI appointment for you.  Medication Instructions:  Your physician recommends that you continue on your current medications as directed. Please refer to the Current Medication list given to you today.  *If you need a refill on your cardiac medications before your next appointment, please call your pharmacy*  Lab Work: Your physician recommends that you return for lab work in: [place].  If you have labs (blood work) drawn today and your tests are completely normal, you will receive your results only by: [name] MyChart Message (if you have MyChart) OR . A paper copy in the mail If you have any lab test that is abnormal or we need to change your treatment, we will call you to review the results.  Testing/Procedures:  Your physician has requested that you have a cardiac MRI. Cardiac MRI uses a computer to create images of your heart as its beating, producing both still and moving pictures of your heart and major blood vessels. For further information please visit [email or web address]  Someone will call you to schedule once insurance precertification is complete.   Follow-Up: At [facility], you and your health needs are our priority.  As part of our continuing mission to provide you with exceptional heart care, we have created designated Provider Care Teams.  These Care Teams include your primary Cardiologist (physician) and Advanced Practice Providers (APPs -  Physician Assistants and Nurse Practitioners) who all work together to provide you with the care you need, when you need it.  We recommend signing up for the patient portal called "MyChart".  Sign up information is provided on this After Visit Summary.  MyChart is used to connect with patients for Virtual Visits (Telemedicine).  Patients are able to view lab/test results, encounter notes, upcoming appointments, etc.  Non-urgent messages can be sent to  your provider as well.   To learn more about what you can do with MyChart, go to [email or web address].    Your next appointment:   6 month(s)  The format for your next appointment:   In Person  Provider:   You may see [name], MD or one of the following Advanced Practice Providers on your designated Care Team:    [name], NP  [name], PA-C  [name], PA-C  Cadence Kathlen Mody, [place]  Laurann Montana, NP   Magnetic Resonance Imaging Magnetic resonance imaging (MRI) is a painless test that takes pictures of the inside of your body. This test uses a strong magnet. It does not use X-rays. An MRI can show more details about a medical problem than other tests. Tell a health care provider about:  Any allergies you have.  All medicines you are taking. This includes vitamins, herbs, eye drops, creams, and over-the-counter medicines.  Any surgeries you have had.  Any medical problems you have.  Any metal you may have in your body. This includes: ? Any new joint, such as a man-made (artificial) knee or hip. ? Any implanted devices, such as a pacemaker. ? An ear implant with metal (cochlear implant). ? An artificial heart valve. ? An object in your eye that has metal. ? Metal splinters. ? Pieces of a bullet. ? A port for insulin or chemotherapy.  Any tattoos you have.  If you have a birth control implant, such as an IUD.  Whether you are pregnant, may be pregnant, or are breastfeeding.  Any fear of small spaces (  claustrophobia). You may be given a medicine to help you relax. What are the risks? Generally, this is a safe test. However, problems may occur. These include:  If you have metal in your body near the area being tested, it may be hard to get clear pictures.  If you are pregnant, you should avoid MRI tests during the first three months of pregnancy.  If you are breastfeeding and dye will be used during your test, you may need to stop until  the dye leaves your body.  If dye is used, there is a risk of an allergic reaction to the dye. You can take medicines to prevent this reaction or to treat it if you have symptoms.  If dye is used, it can cause damage to your kidneys. Drinking plenty of water before and after the test can help prevent this. What happens before the test?  You will be asked to take off all metal. This includes: ? Your watch, jewelry, and other metal things. ? Hearing aids. ? Dentures. ? An underwire bra. ? Makeup.  Braces and dental fillings normally are not a problem.  If you are breastfeeding, ask your doctor if you need to pump before your test. You may need to stop breastfeeding for a time if dye will be used. What happens during the test?  You may be given earplugs or headphones to listen to music. The MRI machine can be noisy.  You will lie down on a table.  If dye will be used, an IV tube will be placed into one of your veins. Dye will be given through your IV tube.  The table will slide into a tunnel. The tunnel has magnets inside of it. When you are inside the tunnel, you will still be able to talk to your doctor.  The tunnel will scan your body and make images. You will be asked to lie very still. Your doctor will tell you when you can move.  When all images are taken, the table will slide out of the tunnel. The test can take 30 minutes to over an hour. The test may vary among doctors and hospitals.   What can I expect after test?  If you were given a medicine to help you relax, you may be monitored until you leave the hospital or clinic. This includes checking your blood pressure, heart rate, breathing rate, and blood oxygen level.  If dye was used: ? It will leave your body through your pee (urine). This takes about a day. ? You may be told to drink plenty of fluids. This helps your body get rid of the dye. ? Do not breastfeed your child until your doctor says that this is  safe. Follow these instructions at home:  You may go back to your normal activities right away, or as told by your doctor.  It is up to you to get your test results. Ask how to get your results when they are ready.  Keep all follow-up visits. Summary  Magnetic resonance imaging (MRI) is a painless test that takes pictures of the inside of your body.  Dye may be used to get MRI pictures that are even more clear.  Before your MRI, be sure to tell your doctor about any metal you may have in your body.  Talk with your doctor about what your test results mean. This information is not intended to replace advice given to you by your health care provider. Make sure you discuss any questions  you have with your health care provider. Document Revised: 07/13/2019 Document Reviewed: 07/13/2019 Elsevier Patient Education  2021 [employer].

## 2020-05-24 ENCOUNTER — Encounter: Payer: Self-pay | Admitting: Internal Medicine

## 2020-05-24 ENCOUNTER — Telehealth: Payer: Self-pay | Admitting: Internal Medicine

## 2020-05-24 LAB — BASIC METABOLIC PANEL
BUN/Creatinine Ratio: 19 (ref 12–28)
BUN: 19 mg/dL (ref 8–27)
CO2: 17 mmol/L — ABNORMAL LOW (ref 20–29)
Calcium: 9.2 mg/dL (ref 8.7–10.3)
Chloride: 109 mmol/L — ABNORMAL HIGH (ref 96–106)
Creatinine, Ser: 1.02 mg/dL — ABNORMAL HIGH (ref 0.57–1.00)
Glucose: 79 mg/dL (ref 65–99)
Potassium: 4.7 mmol/L (ref 3.5–5.2)
Sodium: 143 mmol/L (ref 134–144)
eGFR: 59 mL/min/{1.73_m2} — ABNORMAL LOW (ref >59)

## 2020-05-24 NOTE — Telephone Encounter (Signed)
Left message for patient to call and discuss scheduling the Cardiac MRI ordered by Dr. Saunders Revel

## 2020-05-28 NOTE — Telephone Encounter (Signed)
Left message for patient to call and discuss scheduling the ordered Cardiac MRI

## 2020-05-29 ENCOUNTER — Telehealth: Payer: Self-pay | Admitting: Licensed Clinical Social Worker

## 2020-05-29 NOTE — Telephone Encounter (Signed)
Care navigation team received referral that pt may need transportation assistance to Cardiac MRI when it is scheduled. LCSW aware Natalie Knox w/ scheduling has had difficulty getting in touch w/ pt to schedule this. LCSW attempted to reach pt at [phone]. No answer, HIPAA compliant message left. F/u message sent to [facility]   Remain available to assist w/ pt needs as needed.  Westley Hummer, MSW, [place]  [phone]

## 2020-05-29 NOTE — Telephone Encounter (Signed)
Left message for daughter [name]) on DPR to call and discuss scheduling the Cardiac MRI ordered by Dr. Saunders Revel

## 2020-06-04 NOTE — Telephone Encounter (Signed)
Spoke with patient's daughter [name] (on [place]) and she states she will have Ms. Tomeo call me regarding the Cardiac MRI that was ordered by Dr. Saunders Revel.

## 2020-06-05 ENCOUNTER — Telehealth: Payer: Self-pay | Admitting: Licensed Clinical Social Worker

## 2020-06-05 ENCOUNTER — Encounter: Payer: Self-pay | Admitting: Internal Medicine

## 2020-06-05 NOTE — Progress Notes (Signed)
Heart and Vascular Care Navigation  06/05/2020  Natalie Knox 04-22-1950 [id number]  Reason for Referral:  Transportation to cardiac MRI                                                                                             Assessment:       LCSW was alerted by scheduler Natalie Knox that pt in need of transportation to appointment for Cardiac MRI next month. I called and was finally able to reach pt at [phone]. Introduced self, role, reason for call. Pt confirmed home address; she lives there with her adult daughter Natalie Knox. Pt confirmed she is in need of ride to the Cardiac MRI at [facility]. Pt shares that she often does not pick up calls if she is not sure if they are spam or not. Pt provided w/ number for Transportation Services and I have also mailed her information about appointment and Transportation Services card.   Pt denies any additional financial hardship around obtaining food, medications or housing. I encouraged her to let our offices know if that becomes a challenge for her.                                  HRT/VAS Care Coordination    Patients Home Cardiology Office [place] Team Social Worker   Living arrangements for the past 2 months Single Family Home   Lives with: Adult Children   Patient Current Insurance Coverage Managed Medicare   Patient Has Concern With Paying Medical Bills No   Does Patient Have Prescription Coverage? Yes   Home Assistive Devices/Equipment None      Social History:                                                                             SDOH Screenings   Alcohol Screen: Not on file  Depression (ERX5-4): Not on file  Financial Resource Strain: Not on file  Food Insecurity: No Food Insecurity  . Worried About Charity fundraiser in the Last Year: Never true  . Ran Out of Food in the Last Year: Never true  Housing: Low Risk   . Last Housing Risk Score: 0  Physical Activity: Not on file  Social Connections: Not  on file  Stress: Not on file  Tobacco Use: Medium Risk  . Smoking Tobacco Use: Former Smoker  . Smokeless Tobacco Use: Never Used  Transportation Needs: Unmet Transportation Needs  . Lack of Transportation (Medical): Yes  . Lack of Transportation (Non-Medical): Yes    SDOH Interventions: Food Insecurity:  Food Insecurity Interventions: Intervention Not Indicated  Housing Insecurity:  Housing Interventions: Intervention Not Indicated  Transportation:   Transportation Interventions: Financial planner   Follow-up plan:   I  have mailed information about appt and ride. I have sent referral for a ride and I will f/u the week before MRI to ensure ride scheduled. I remain available for any additional support needed. I will update Dr. Darnelle Bos office and Natalie Knox in scheduling of the above.

## 2020-06-05 NOTE — Telephone Encounter (Signed)
Spoke with patient regarding the Monday 07/16/20  Cardiac MRI appointment at [facility]..  Arrival time is 11:30 am--1st floor admissions office for check in.  Will mail information to patient and it is available in My Chart.  Will speak with Westley Hummer, MSW, LCSW --she will assist patient with her transportation needs.

## 2020-06-14 ENCOUNTER — Inpatient Hospital Stay: Payer: Medicare Other | Attending: Oncology

## 2020-06-14 DIAGNOSIS — D509 Iron deficiency anemia, unspecified: Secondary | ICD-10-CM | POA: Diagnosis present

## 2020-06-14 LAB — CBC WITH DIFFERENTIAL/PLATELET
Abs Immature Granulocytes: 0.02 10*3/uL (ref 0.00–0.07)
Basophils Absolute: 0.1 10*3/uL (ref 0.0–0.1)
Basophils Relative: 1 %
Eosinophils Absolute: 0.2 10*3/uL (ref 0.0–0.5)
Eosinophils Relative: 3 %
HCT: 35.9 % — ABNORMAL LOW (ref 36.0–46.0)
Hemoglobin: 11.9 g/dL — ABNORMAL LOW (ref 12.0–15.0)
Immature Granulocytes: 0 %
Lymphocytes Relative: 30 %
Lymphs Abs: 1.6 10*3/uL (ref 0.7–4.0)
MCH: 31.1 pg (ref 26.0–34.0)
MCHC: 33.1 g/dL (ref 30.0–36.0)
MCV: 93.7 fL (ref 80.0–100.0)
Monocytes Absolute: 0.5 10*3/uL (ref 0.1–1.0)
Monocytes Relative: 10 %
Neutro Abs: 2.9 10*3/uL (ref 1.7–7.7)
Neutrophils Relative %: 56 %
Platelets: 228 10*3/uL (ref 150–400)
RBC: 3.83 MIL/uL — ABNORMAL LOW (ref 3.87–5.11)
RDW: 14.2 % (ref 11.5–15.5)
WBC: 5.1 10*3/uL (ref 4.0–10.5)
nRBC: 0 % (ref 0.0–0.2)

## 2020-06-14 LAB — IRON AND TIBC
Iron: 94 ug/dL (ref 28–170)
Saturation Ratios: 37 % — ABNORMAL HIGH (ref 10.4–31.8)
TIBC: 256 ug/dL (ref 250–450)
UIBC: 162 ug/dL

## 2020-06-14 LAB — FERRITIN: Ferritin: 212 ng/mL (ref 11–307)

## 2020-07-11 ENCOUNTER — Telehealth: Payer: Self-pay | Admitting: Licensed Clinical Social Worker

## 2020-07-11 NOTE — Telephone Encounter (Signed)
LCSW attempted to reach pt to schedule ride for MRI on 4/25, no answer. HIPAA compliant message left for pt at [phone].   Westley Hummer, MSW, [place]  [phone]

## 2020-07-11 NOTE — Telephone Encounter (Signed)
Patient had called this writer back and let me know that she would like for me to text her details about the appointment on Monday to her daughter Lafawn. She has a ride from her granddaughter to get there and shares she doesn't need a ride from [employer] at this time. LCSW confirmed pt daughter Sherlynn Carbon at [phone], provided her address and timing of appointment and also confirmed with her that pt has a ride. Encouraged her to let us know if the ride falls through or if she needs any additional support with a ride.   I cancelled pt scheduled ride with coordinator Monserrat. Remain available as needed, pt still has access to [employer] as needed.   Westley Hummer, MSW, [place]  [phone]

## 2020-07-12 ENCOUNTER — Telehealth (HOSPITAL_COMMUNITY): Payer: Self-pay | Admitting: *Deleted

## 2020-07-12 NOTE — Telephone Encounter (Signed)
Attempted to call patient regarding upcoming cardiac MRI appointment. Left message on voicemail with name and callback number  Braulio Kiedrowski RN Navigator Cardiac Imaging [facility] [phone] Office [phone] Cell  

## 2020-07-16 ENCOUNTER — Ambulatory Visit (HOSPITAL_COMMUNITY)
Admission: RE | Admit: 2020-07-16 | Discharge: 2020-07-16 | Disposition: A | Discharge status: Home / Self Care | Payer: Medicare Other | Source: Ambulatory Visit | Attending: Internal Medicine | Admitting: Internal Medicine

## 2020-07-16 ENCOUNTER — Other Ambulatory Visit: Payer: Self-pay

## 2020-07-16 DIAGNOSIS — I517 Cardiomegaly: Secondary | ICD-10-CM

## 2020-07-16 DIAGNOSIS — I429 Cardiomyopathy, unspecified: Secondary | ICD-10-CM

## 2020-07-16 MED ORDER — GADOBUTROL 1 MMOL/ML IV SOLN
8.0000 mL | Freq: Once | INTRAVENOUS | Status: AC | PRN
  Administered 2020-07-16: 8 mL via INTRAVENOUS

## 2020-07-18 ENCOUNTER — Telehealth: Payer: Self-pay | Admitting: Internal Medicine

## 2020-07-18 NOTE — Telephone Encounter (Signed)
Patient calling to discuss recent testing results  ° °Please call  ° °

## 2020-07-18 NOTE — Telephone Encounter (Addendum)
DPR o file. The patient and this RN have been playing phone tag this afternoon. lmom with the results of the patients cardiac MRI and Dr. Marisue Humble' recommendation. Also released to mychart. Patient is to contact the office if any questions.

## 2020-07-18 NOTE — Telephone Encounter (Signed)
Returned the patients call. lmtcb. 

## 2020-07-18 NOTE — Telephone Encounter (Signed)
End, Harrell Gave, MD  P Cv Div [place] Triage Please let Natalie Knox know that her cardiac MRI shows some areas of moderate thickening of her heart but no evidence of scar tissue. Findings are not definitive for hypertrophic cardiomyopathy and could be related to hypertensive heart disease. I suggest that we continue Natalie Knox's current medications and follow-up as previously arranged.

## 2020-07-18 NOTE — Telephone Encounter (Signed)
Patient returning call.

## 2020-07-18 NOTE — Telephone Encounter (Signed)
Called to give the patient cardiac MRI results and Dr. Darnelle Bos recommendation. lmtcb.

## 2020-07-19 ENCOUNTER — Encounter: Payer: Self-pay | Admitting: *Deleted

## 2020-07-19 NOTE — Telephone Encounter (Signed)
Spoke with patient and reviewed results and recommendations. She verbalized understanding with no further questions at this time.

## 2020-07-19 NOTE — Telephone Encounter (Signed)
Patient has not yet reviewed results so attempted to call again. Left voicemail message for patient to call back for review of results.

## 2020-07-31 ENCOUNTER — Other Ambulatory Visit: Payer: Self-pay | Admitting: Internal Medicine

## 2020-08-16 DIAGNOSIS — E538 Deficiency of other specified B group vitamins: Secondary | ICD-10-CM | POA: Diagnosis not present

## 2020-08-16 DIAGNOSIS — Z8661 Personal history of infections of the central nervous system: Secondary | ICD-10-CM | POA: Diagnosis not present

## 2020-08-16 DIAGNOSIS — R413 Other amnesia: Secondary | ICD-10-CM | POA: Diagnosis not present

## 2020-08-30 ENCOUNTER — Telehealth: Payer: Self-pay | Admitting: Oncology

## 2020-08-30 NOTE — Telephone Encounter (Signed)
Left VM with patient to notify her of new appointment time due to Dr. Elroy Channel change in clinic schedule. Left direct # to call back if there are any conflicts. Mailing updated [id number]--jcs

## 2020-09-13 ENCOUNTER — Other Ambulatory Visit: Payer: Medicare HMO

## 2020-09-13 ENCOUNTER — Ambulatory Visit: Payer: Medicare HMO | Admitting: Oncology

## 2020-09-22 IMAGING — MG DIGITAL SCREENING BILAT W/ TOMO W/ CAD
8 series · 8 of 24 positions shown · non-contrast
Comparison: Previous exam(s).

CLINICAL DATA: Screening.

EXAM:
DIGITAL SCREENING BILATERAL MAMMOGRAM WITH TOMO AND CAD

[L CC synth-2D]
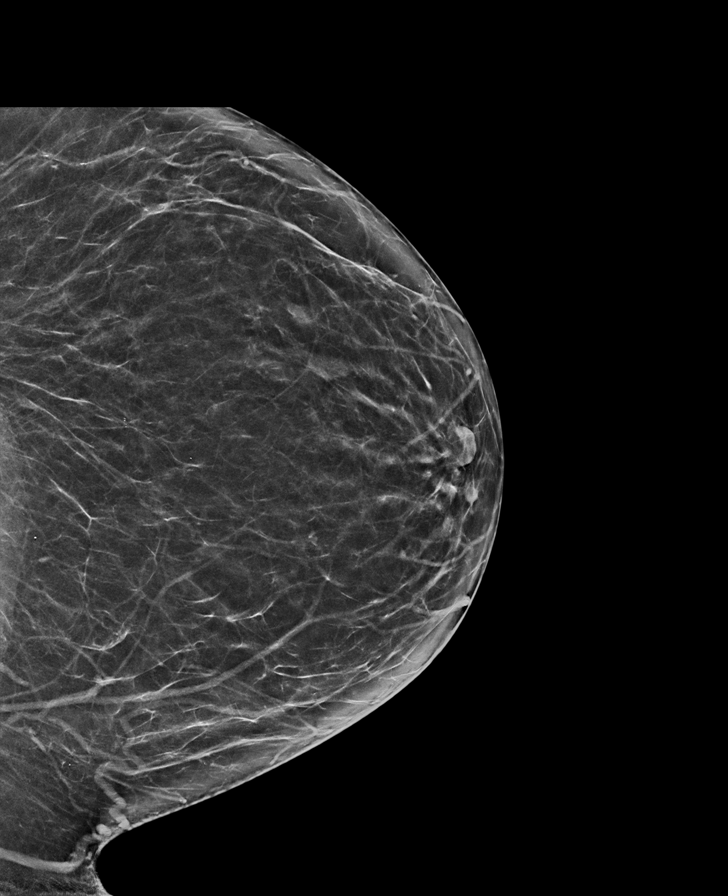

[R MLO synth-2D]
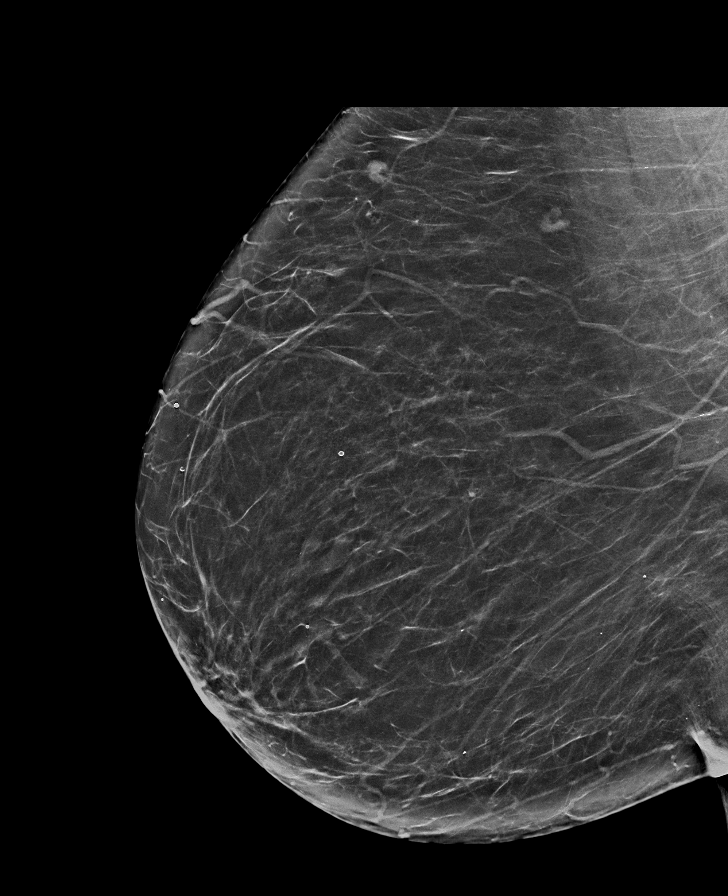

[R CC synth-2D]
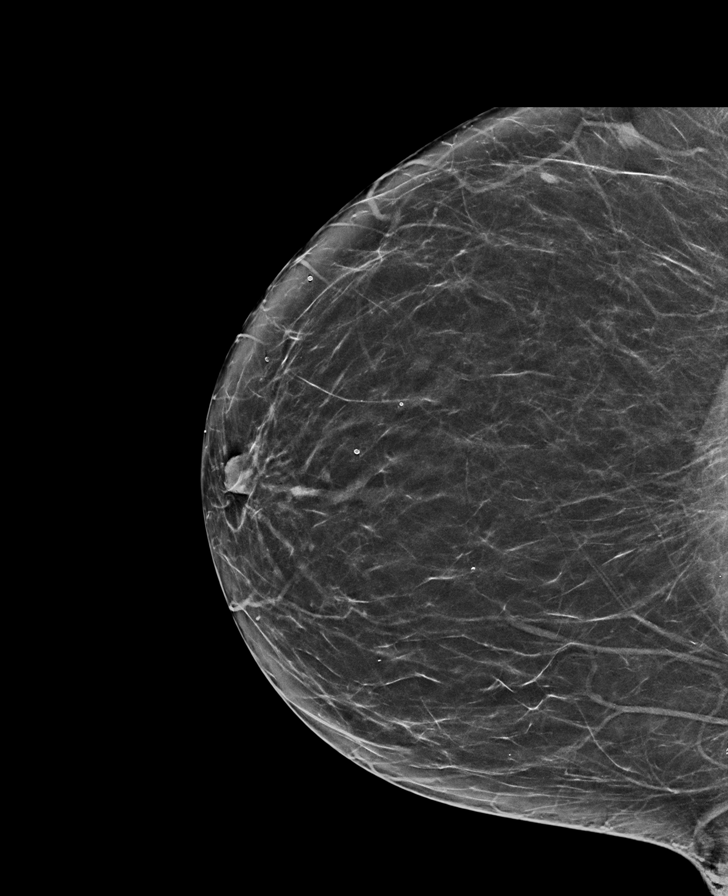

[L MLO synth-2D]
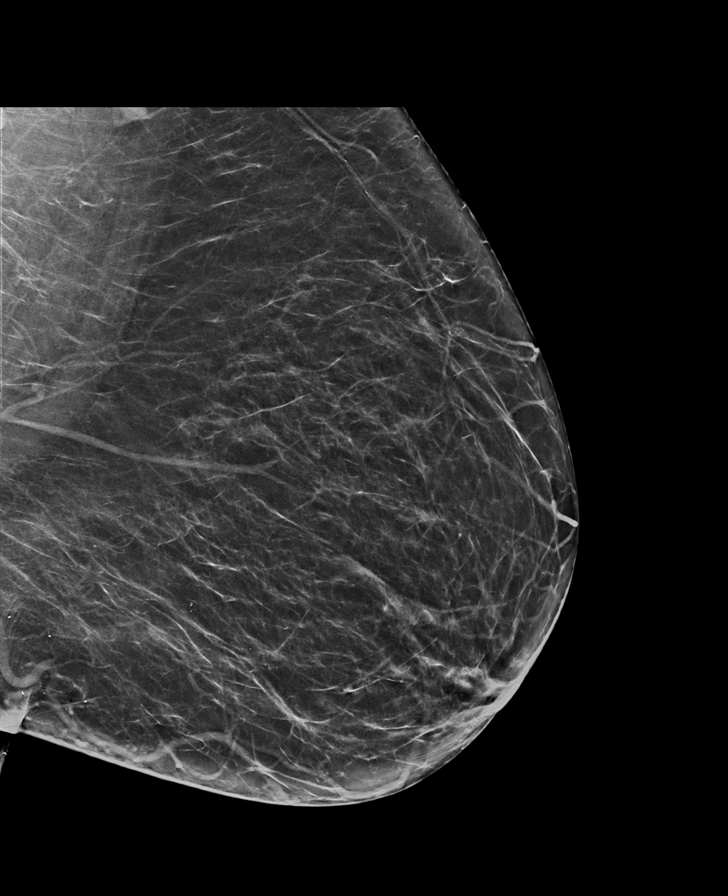

[L CC tomo · tomo slice 35/70.0]
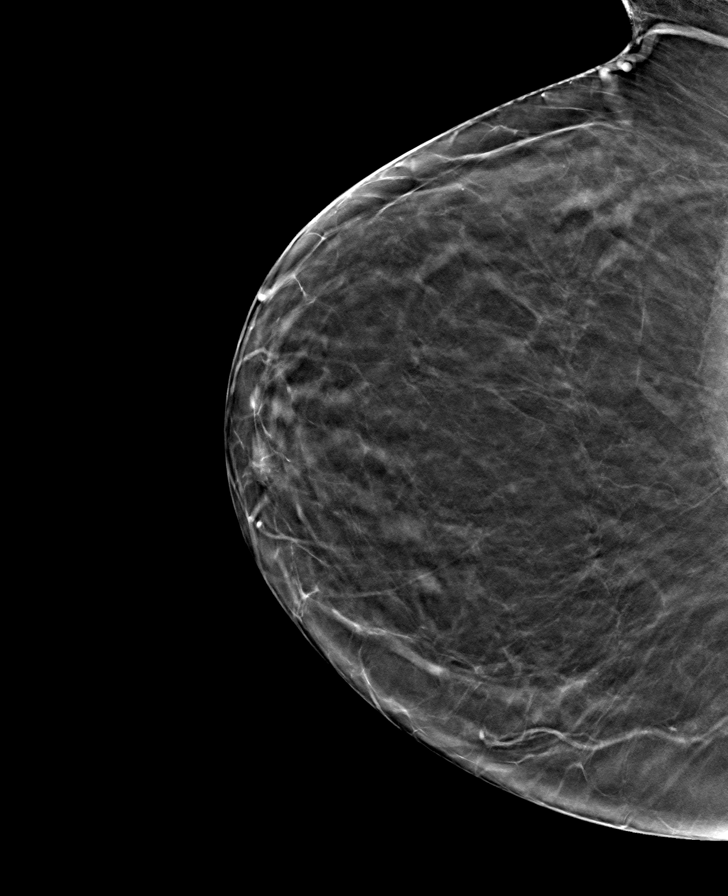

[R MLO tomo · tomo slice 36/71.0]
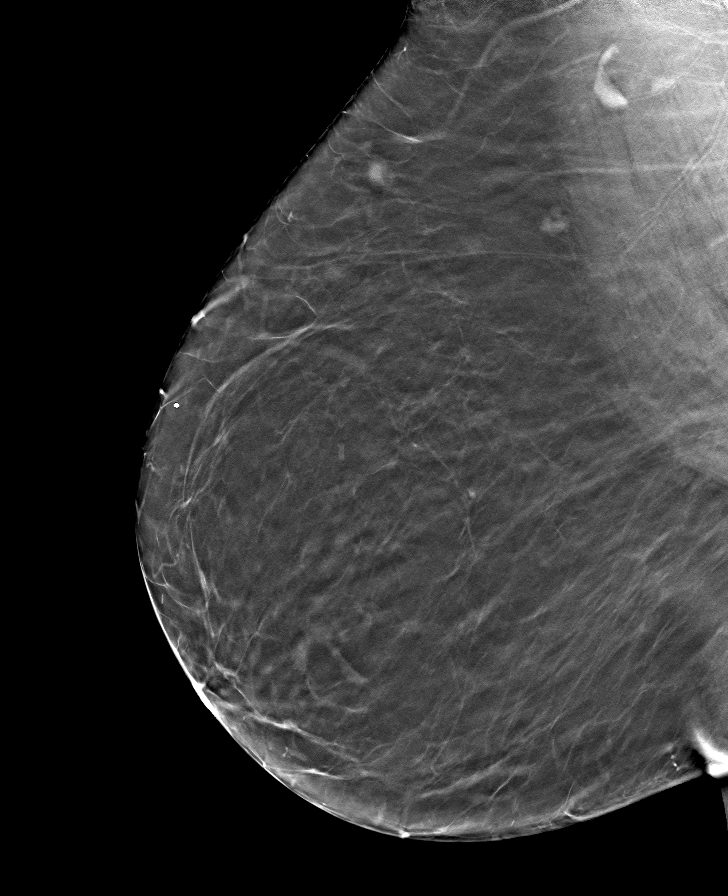

[R CC tomo · tomo slice 35/69.0]
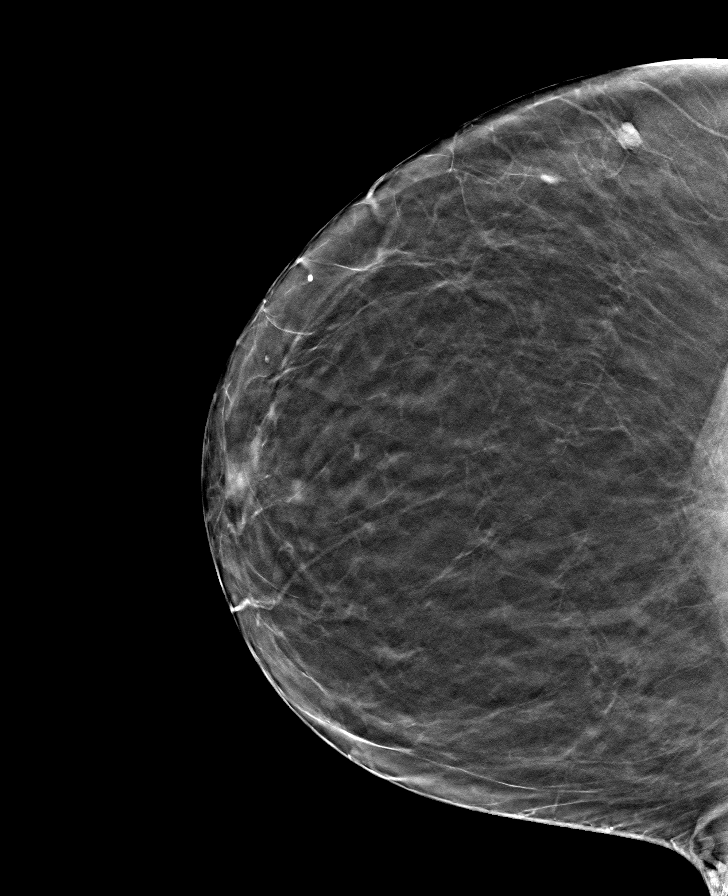

[L MLO tomo · tomo slice 37/72.0]
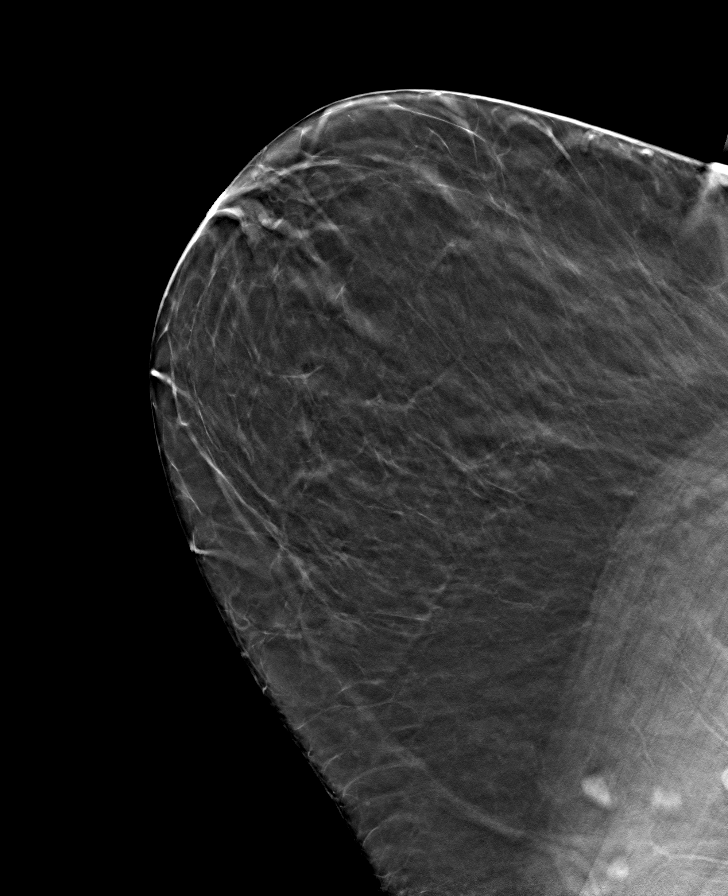

[8 of 24 positions shown; findings below may reference images not displayed]

ACR Breast Density Category b: There are scattered areas of
fibroglandular density.
FINDINGS: There are no findings suspicious for malignancy. Images were
processed with CAD.
IMPRESSION: No mammographic evidence of malignancy. A result letter of this
screening mammogram will be mailed directly to the patient.

RECOMMENDATION:
Screening mammogram in one year. (Code:[id number]-775)

BI-RADS CATEGORY  1: Negative.

## 2020-09-25 ENCOUNTER — Other Ambulatory Visit: Payer: Self-pay | Admitting: Family Medicine

## 2020-09-25 DIAGNOSIS — Z1231 Encounter for screening mammogram for malignant neoplasm of breast: Secondary | ICD-10-CM

## 2020-09-28 ENCOUNTER — Inpatient Hospital Stay: Payer: PRIVATE HEALTH INSURANCE | Admitting: Oncology

## 2020-09-28 ENCOUNTER — Inpatient Hospital Stay: Payer: PRIVATE HEALTH INSURANCE

## 2020-10-11 ENCOUNTER — Inpatient Hospital Stay: Payer: PRIVATE HEALTH INSURANCE

## 2020-10-11 ENCOUNTER — Inpatient Hospital Stay: Payer: PRIVATE HEALTH INSURANCE | Admitting: Oncology

## 2020-10-22 ENCOUNTER — Other Ambulatory Visit: Payer: Self-pay

## 2020-10-22 ENCOUNTER — Encounter: Payer: Self-pay | Admitting: Oncology

## 2020-10-22 ENCOUNTER — Ambulatory Visit
Admission: RE | Admit: 2020-10-22 | Discharge: 2020-10-22 | Disposition: A | Discharge status: Home / Self Care | Payer: Medicare (Managed Care) | Source: Ambulatory Visit | Attending: Family Medicine | Admitting: Family Medicine

## 2020-10-22 DIAGNOSIS — Z1231 Encounter for screening mammogram for malignant neoplasm of breast: Secondary | ICD-10-CM | POA: Diagnosis not present

## 2020-10-24 ENCOUNTER — Encounter: Payer: Self-pay | Admitting: Oncology

## 2020-10-29 ENCOUNTER — Encounter: Payer: Self-pay | Admitting: Oncology

## 2020-10-29 ENCOUNTER — Inpatient Hospital Stay: Payer: Medicare (Managed Care)

## 2020-10-29 ENCOUNTER — Inpatient Hospital Stay: Payer: Medicare (Managed Care) | Attending: Oncology | Admitting: Oncology

## 2020-10-29 VITALS — BP 133/55 | HR 47 | Temp 96.2°F | Resp 18 | Wt 178.1 lb

## 2020-10-29 DIAGNOSIS — Z79899 Other long term (current) drug therapy: Secondary | ICD-10-CM | POA: Diagnosis not present

## 2020-10-29 DIAGNOSIS — I10 Essential (primary) hypertension: Secondary | ICD-10-CM | POA: Diagnosis not present

## 2020-10-29 DIAGNOSIS — E785 Hyperlipidemia, unspecified: Secondary | ICD-10-CM | POA: Diagnosis not present

## 2020-10-29 DIAGNOSIS — D508 Other iron deficiency anemias: Secondary | ICD-10-CM | POA: Diagnosis not present

## 2020-10-29 DIAGNOSIS — D509 Iron deficiency anemia, unspecified: Secondary | ICD-10-CM | POA: Diagnosis present

## 2020-10-29 LAB — CBC WITH DIFFERENTIAL/PLATELET
Abs Immature Granulocytes: 0.02 10*3/uL (ref 0.00–0.07)
Basophils Absolute: 0 10*3/uL (ref 0.0–0.1)
Basophils Relative: 0 %
Eosinophils Absolute: 0.2 10*3/uL (ref 0.0–0.5)
Eosinophils Relative: 4 %
HCT: 32.9 % — ABNORMAL LOW (ref 36.0–46.0)
Hemoglobin: 11 g/dL — ABNORMAL LOW (ref 12.0–15.0)
Immature Granulocytes: 0 %
Lymphocytes Relative: 31 %
Lymphs Abs: 1.5 10*3/uL (ref 0.7–4.0)
MCH: 31.1 pg (ref 26.0–34.0)
MCHC: 33.4 g/dL (ref 30.0–36.0)
MCV: 92.9 fL (ref 80.0–100.0)
Monocytes Absolute: 0.5 10*3/uL (ref 0.1–1.0)
Monocytes Relative: 10 %
Neutro Abs: 2.6 10*3/uL (ref 1.7–7.7)
Neutrophils Relative %: 55 %
Platelets: 228 10*3/uL (ref 150–400)
RBC: 3.54 MIL/uL — ABNORMAL LOW (ref 3.87–5.11)
RDW: 14.3 % (ref 11.5–15.5)
WBC: 4.8 10*3/uL (ref 4.0–10.5)
nRBC: 0 % (ref 0.0–0.2)

## 2020-10-29 LAB — IRON AND TIBC
Iron: 91 ug/dL (ref 28–170)
Saturation Ratios: 36 % — ABNORMAL HIGH (ref 10.4–31.8)
TIBC: 256 ug/dL (ref 250–450)
UIBC: 165 ug/dL

## 2020-10-29 LAB — FERRITIN: Ferritin: 218 ng/mL (ref 11–307)

## 2020-10-29 NOTE — Progress Notes (Signed)
Hematology/Oncology Consult note [facility]  Telephone:([phone] [phone]  Patient Care Team: Derinda Late, MD as PCP - General (Family Medicine) End, Harrell Gave, MD as PCP - Cardiology (Cardiology)   Name of the patient: Natalie Knox  [id number]  January 20, 1951   Date of visit: 10/29/20  Diagnosis-iron deficiency anemia  Chief complaint/ Reason for visit-routine follow-up of iron deficiency anemia  Heme/Onc history: patient is a 70 year old African-American female With a past medical history significant for hypertension hyperlipidemia was recently admitted to the hospital for acute blood loss anemia.  She had EGD and colonoscopy showed erythema in the antrum of the stomach but did not reveal any cause of bleeding.  She has not yet completed her capsule study.  She was found to have a hemoglobin of 6.5 on admission with iron studies indicative of iron deficiency.  Patient was given 1 unit of blood transfusion and 1 dose of Venofer.  Her hemoglobin on discharge was 8.6.  Patient received 2 doses of Feraheme in November 2021. Hemoglobin since then has remained stable around 11.  Interval history-patient has baseline fatigue but denies other new complaints at this time.  Denies any bleeding in her stool or urine.  Denies any dark melanotic stools  ECOG PS- 1 Pain scale- 0   Review of systems- Review of Systems  Constitutional:  Positive for malaise/fatigue. Negative for chills, fever and weight loss.  HENT:  Negative for congestion, ear discharge and nosebleeds.   Eyes:  Negative for blurred vision.  Respiratory:  Negative for cough, hemoptysis, sputum production, shortness of breath and wheezing.   Cardiovascular:  Negative for chest pain, palpitations, orthopnea and claudication.  Gastrointestinal:  Negative for abdominal pain, blood in stool, constipation, diarrhea, heartburn, melena, nausea and vomiting.  Genitourinary:  Negative for dysuria,  flank pain, frequency, hematuria and urgency.  Musculoskeletal:  Negative for back pain, joint pain and myalgias.  Skin:  Negative for rash.  Neurological:  Negative for dizziness, tingling, focal weakness, seizures, weakness and headaches.  Endo/Heme/Allergies:  Does not bruise/bleed easily.  Psychiatric/Behavioral:  Negative for depression and suicidal ideas. The patient does not have insomnia.      No Known Allergies   Past Medical History:  Diagnosis Date   Allergic rhinitis    Anemia    Atypical chest pain    a. 05/2009 Ex MV: EF 67%, no ischemia/infarct; b. 12/2018 MV: No ischemia/infarct. Cor Ca2+ and Aortic atherosclerosis noted. EF 55-65%.   Carotid arterial disease ([place])    a. 10/2019 Carotid U/S: 1-39% bilat ICA stenosis w/ <50% RECA and CCA stenoses.   Colon polyp    a. 11/2018 Colonscopy: 31m polyp removed. Otw nl study.   Hard of hearing    has hearing aid   Hyperlipidemia    Hypertension    Iron deficiency anemia    a. 11/2018 s/p 1u prbcs-->EGD w/ erythema in antrum of stomach but otw nl. Colonoscopy notable for 64mpolyp.   LVH (left ventricular hypertrophy)    a. 11/2018 Echo: EF 60-65%, mod to sev LVH w/ near cavity obliteration in systole. Diast dysfxn. Nl RV size/fxn. Mildly elev PASP.   Neurosyphilis      Past Surgical History:  Procedure Laterality Date   COLONOSCOPY     COLONOSCOPY N/A 12/22/2019   Procedure: COLONOSCOPY;  Surgeon: TaVirgel ManifoldMD;  Location: [facility] ENDOSCOPY;  Service: Endoscopy;  Laterality: N/A;   ESOPHAGOGASTRODUODENOSCOPY N/A 12/21/2019   Procedure: ESOPHAGOGASTRODUODENOSCOPY (EGD);  Surgeon: TaVonda Antigua  B, MD;  Location: [facility] ENDOSCOPY;  Service: Endoscopy;  Laterality: N/A;   GIVENS CAPSULE STUDY N/A 12/22/2019   Procedure: GIVENS CAPSULE STUDY;  Surgeon: Virgel Manifold, MD;  Location: [facility] ENDOSCOPY;  Service: Endoscopy;  Laterality: N/A;  If colon finds nothing, needs a capsule endoscopy   GIVENS CAPSULE STUDY N/A  02/01/2020   Procedure: GIVENS CAPSULE STUDY;  Surgeon: Virgel Manifold, MD;  Location: [facility] ENDOSCOPY;  Service: Endoscopy;  Laterality: N/A;   UPPER GI ENDOSCOPY      Social History   Socioeconomic History   Marital status: Single    Spouse name: Not on file   Number of children: Not on file   Years of education: Not on file   Highest education level: Not on file  Occupational History   Not on file  Tobacco Use   Smoking status: Former    Packs/day: 0.50    Years: 25.00    Pack years: 12.50    Types: Cigarettes    Quit date: 05/2017    Years since quitting: 3.4   Smokeless tobacco: Never  Vaping Use   Vaping Use: Never used  Substance and Sexual Activity   Alcohol use: Not Currently    Comment: no drink since march   Drug use: No   Sexual activity: Not on file  Other Topics Concern   Not on file  Social History Narrative   Not on file   Social Determinants of Health   Financial Resource Strain: Not on file  Food Insecurity: No Food Insecurity   Worried About Charity fundraiser in the Last Year: Never true   Ran Out of Food in the Last Year: Never true  Transportation Needs: Unmet Transportation Needs   Lack of Transportation (Medical): Yes   Lack of Transportation (Non-Medical): Yes  Physical Activity: Not on file  Stress: Not on file  Social Connections: Not on file  Intimate Partner Violence: Not on file    Family History  Problem Relation Age of Onset   Breast cancer Sister    Heart attack Mother 93     Current Outpatient Medications:    amLODipine (NORVASC) 10 MG tablet, Take 10 mg by mouth daily., Disp: , Rfl:    carvedilol (COREG) 6.25 MG tablet, Take 6.25 mg by mouth 2 (two) times daily with a meal., Disp: , Rfl:    donepezil (ARICEPT) 5 MG tablet, Take 5 mg by mouth at bedtime., Disp: , Rfl:    lisinopril (PRINIVIL,ZESTRIL) 40 MG tablet, Take 40 mg by mouth daily., Disp: , Rfl:    pantoprazole (PROTONIX) 40 MG tablet, Take 40 mg by mouth  daily., Disp: , Rfl:    pravastatin (PRAVACHOL) 40 MG tablet, Take 40 mg by mouth daily., Disp: , Rfl:    spironolactone (ALDACTONE) 25 MG tablet, TAKE 1 TABLET EVERY DAY, Disp: 90 tablet, Rfl: 0   vitamin B-12 (CYANOCOBALAMIN) 1000 MCG tablet, Take 1,000 mcg by mouth daily., Disp: , Rfl:   Physical exam:  Vitals:   10/29/20 1005  BP: (!) 133/55  Pulse: (!) 47  Resp: 18  Temp: (!) 96.2 F (35.7 C)  TempSrc: Tympanic  SpO2: 100%  Weight: 178 lb 1.6 oz (80.8 kg)   Physical Exam Constitutional:      General: She is not in acute distress. Cardiovascular:     Rate and Rhythm: Normal rate and regular rhythm.     Heart sounds: Normal heart sounds.  Pulmonary:     Effort: Pulmonary  effort is normal.     Breath sounds: Normal breath sounds.  Abdominal:     General: Bowel sounds are normal.     Palpations: Abdomen is soft.  Skin:    General: Skin is warm and dry.  Neurological:     Mental Status: She is alert and oriented to person, place, and time.     CMP Latest Ref Rng & Units 05/23/2020  Glucose 65 - 99 mg/dL 79  BUN 8 - 27 mg/dL 19  Creatinine 0.57 - 1.00 mg/dL 1.02(H)  Sodium 134 - 144 mmol/L 143  Potassium 3.5 - 5.2 mmol/L 4.7  Chloride 96 - 106 mmol/L 109(H)  CO2 20 - 29 mmol/L 17(L)  Calcium 8.7 - 10.3 mg/dL 9.2  Total Protein 6.5 - 8.1 g/dL -  Total Bilirubin 0.3 - 1.2 mg/dL -  Alkaline Phos 38 - 126 U/L -  AST 15 - 41 U/L -  ALT 0 - 44 U/L -   CBC Latest Ref Rng & Units 06/14/2020  WBC 4.0 - 10.5 K/uL 5.1  Hemoglobin 12.0 - 15.0 g/dL 11.9(L)  Hematocrit 36.0 - 46.0 % 35.9(L)  Platelets 150 - 400 K/uL 228    No images are attached to the encounter.  MM 3D SCREEN BREAST BILATERAL  Result Date: 10/24/2020 CLINICAL DATA:  Screening. EXAM: DIGITAL SCREENING BILATERAL MAMMOGRAM WITH TOMOSYNTHESIS AND CAD TECHNIQUE: Bilateral screening digital craniocaudal and mediolateral oblique mammograms were obtained. Bilateral screening digital breast tomosynthesis was  performed. The images were evaluated with computer-aided detection. COMPARISON:  Previous exam(s). ACR Breast Density Category b: There are scattered areas of fibroglandular density. FINDINGS: There are no findings suspicious for malignancy. IMPRESSION: No mammographic evidence of malignancy. A result letter of this screening mammogram will be mailed directly to the patient. RECOMMENDATION: Screening mammogram in one year. (Code:SM-B-01Y) BI-RADS CATEGORY  1: Negative. Electronically Signed   By: Claudie Revering M.D.   On: 10/24/2020 12:44    Assessment and plan- Patient is a 70 y.o. female with iron deficiency anemia here for routine follow-up  Patient's hemoglobin is presently stable around 11.  She has not required any Feraheme since November 2021.Iron studies are presently normal and she does not require any IV iron at this time.  Repeat CBC ferritin and iron studies in 3 in 6 months and she will be seen by covering NP in 6 months   Visit Diagnosis 1. Other iron deficiency anemia      Dr. Randa Evens, MD, MPH [facility] [id number] 10/29/2020 5:03 PM

## 2020-11-20 ENCOUNTER — Encounter: Payer: Self-pay | Admitting: Oncology

## 2020-11-28 ENCOUNTER — Other Ambulatory Visit: Payer: Self-pay

## 2020-11-28 ENCOUNTER — Encounter: Payer: Self-pay | Admitting: Oncology

## 2020-11-28 ENCOUNTER — Encounter: Payer: Self-pay | Admitting: Internal Medicine

## 2020-11-28 ENCOUNTER — Ambulatory Visit (INDEPENDENT_AMBULATORY_CARE_PROVIDER_SITE_OTHER): Payer: Medicare Other | Admitting: Internal Medicine

## 2020-11-28 VITALS — BP 126/60 | HR 59 | Ht 64.0 in | Wt 176.0 lb

## 2020-11-28 DIAGNOSIS — I10 Essential (primary) hypertension: Secondary | ICD-10-CM

## 2020-11-28 DIAGNOSIS — I119 Hypertensive heart disease without heart failure: Secondary | ICD-10-CM | POA: Diagnosis not present

## 2020-11-28 DIAGNOSIS — E785 Hyperlipidemia, unspecified: Secondary | ICD-10-CM | POA: Diagnosis not present

## 2020-11-28 DIAGNOSIS — R0789 Other chest pain: Secondary | ICD-10-CM | POA: Diagnosis not present

## 2020-11-28 DIAGNOSIS — I517 Cardiomegaly: Secondary | ICD-10-CM

## 2020-11-28 MED ORDER — ATORVASTATIN CALCIUM 40 MG PO TABS: 40.0000 mg | ORAL_TABLET | Freq: Every day | ORAL | 3 refills | Status: DC

## 2020-11-28 NOTE — Patient Instructions (Signed)
Medication Instructions:   Your physician has recommended you make the following change in your medication:   1) STOP Pravastatin  2) Wait TWO days - Then on Friday 11/30/20, START Atorvastatin 40 mg daily  *If you need a refill on your cardiac medications before your next appointment, please call your pharmacy*   Lab Work:  None ordered  Testing/Procedures:  None ordered   Follow-Up: At [facility], you and your health needs are our priority.  As part of our continuing mission to provide you with exceptional heart care, we have created designated Provider Care Teams.  These Care Teams include your primary Cardiologist (physician) and Advanced Practice Providers (APPs -  Physician Assistants and Nurse Practitioners) who all work together to provide you with the care you need, when you need it.  We recommend signing up for the patient portal called "MyChart".  Sign up information is provided on this After Visit Summary.  MyChart is used to connect with patients for Virtual Visits (Telemedicine).  Patients are able to view lab/test results, encounter notes, upcoming appointments, etc.  Non-urgent messages can be sent to your provider as well.   To learn more about what you can do with MyChart, go to [email or web address].    Your next appointment:   6 month(s)  The format for your next appointment:   In Person  Provider:   You may see one of the following Advanced Practice Providers on your designated Care Team:   [name], NP [name], PA-C [name], PA-C Cadence [place], [place]

## 2020-11-28 NOTE — Progress Notes (Signed)
Follow-up Outpatient Visit Date: 11/28/2020  Primary Care Provider: Derinda Late, MD [phone] [address] [place] [address]  Chief Complaint: Follow-up hypertensive heart disease  HPI:  Natalie Knox is a 70 y.o. female with history of hypertension, hyperlipidemia, iron deficiency anemia, and neurosyphilis, who presents for follow-up of hypertensive heart disease.  I last saw her in March, at which time she was feeling better with improved dyspnea on exertion.  She was referred for cardiac MRI to exclude infiltrative cardiomyopathy in the setting of severe LVH by echo and low voltage on EKG.  This showed moderate asymmetric LVH with septum measuring up to 1.5 cm in thickness.  No delayed hyperenhancement was observed.  Today, Ms. Pipe reports that she feels fairly well.  She notes a few episodes of left-sided chest pain that is not exertional and comes on randomly every few weeks.  It typically lasts a couple of minutes and resolves spontaneously.  She cannot describe it further.  There are no associated symptoms.  She denies palpitations, lightheadedness, syncope, and edema.  Home BP is usually in the 130's/60's.  --------------------------------------------------------------------------------------------------  Cardiovascular History & Procedures: Cardiovascular Problems: Heart murmur Pericardial effusion Coronary artery calcification   Risk Factors: Hypertension, prior tobacco use, and age greater than 83   Cath/PCI: None   CV Surgery: None   EP Procedures and Devices: None   Non-Invasive Evaluation(s): Cardiac MRI (07/16/2020): Moderate asymmetric LVH with basal septal hypertrophy measuring up to 1.5 cm.  Normal LVEF.  Normal RV size and function.  No delayed hyperenhancement.  No significant valvular abnormality. Pharmacologic MPI (12/28/2018): Normal study without ischemia or scar.  LVEF 55-65%.  Coronary artery  calcification and aortic atherosclerosis noted. TTE (12/09/2018): Technically difficult study.  Normal LV size with moderate-severe LVH.  LVEF 60-65%.  Grade 1 diastolic dysfunction.  Normal live RV size and wall thickness.  Mild pulmonary hypertension.  Small pericardial effusion.  Recent CV Pertinent Labs: Lab Results  Component Value Date   CHOL 198 07/08/2018   HDL 75 07/08/2018   LDLCALC 110 (H) 07/08/2018   TRIG 66 07/08/2018   CHOLHDL 2.6 07/08/2018   K 4.7 05/23/2020   BUN 19 05/23/2020   CREATININE 1.02 (H) 05/23/2020    Past medical and surgical history were reviewed and updated in EPIC.  Current Meds  Medication Sig   amLODipine (NORVASC) 10 MG tablet Take 10 mg by mouth daily.   atorvastatin (LIPITOR) 40 MG tablet Take 1 tablet (40 mg total) by mouth daily.   carvedilol (COREG) 6.25 MG tablet Take 6.25 mg by mouth 2 (two) times daily with a meal.   donepezil (ARICEPT) 5 MG tablet Take 5 mg by mouth at bedtime.   lisinopril (PRINIVIL,ZESTRIL) 40 MG tablet Take 40 mg by mouth daily.   pantoprazole (PROTONIX) 40 MG tablet Take 40 mg by mouth daily.   spironolactone (ALDACTONE) 25 MG tablet TAKE 1 TABLET EVERY DAY   vitamin B-12 (CYANOCOBALAMIN) 1000 MCG tablet Take 1,000 mcg by mouth daily.   [DISCONTINUED] pravastatin (PRAVACHOL) 40 MG tablet Take 40 mg by mouth daily.    Allergies: Patient has no known allergies.  Social History   Tobacco Use   Smoking status: Former    Packs/day: 0.50    Years: 25.00    Pack years: 12.50    Types: Cigarettes    Quit date: 05/2017    Years since quitting: 3.5   Smokeless tobacco: Never  Vaping Use  Vaping Use: Never used  Substance Use Topics   Alcohol use: Not Currently    Comment: no drink since march   Drug use: No    Family History  Problem Relation Age of Onset   Breast cancer Sister    Heart attack Mother 20    Review of Systems: A 12-system review of systems was performed and was negative except as noted in  the HPI.  --------------------------------------------------------------------------------------------------  Physical Exam: BP 126/60 (BP Location: Left Arm, Patient Position: Sitting, Cuff Size: Large)   Pulse (!) 59   Ht '5\' 4"'$  (1.626 m)   Wt 176 lb (79.8 kg)   SpO2 98%   BMI 30.21 kg/m   General:  NAD. Neck: No JVD or HJR. Lungs: Clear to auscultation bilaterally without wheezes or crackles. Heart: Regular rate and rhythm with 2/6 systolic murmur. Abdomen: Soft, nontender, nondistended. Extremities: No lower extremity edema.  EKG:  Normal sinus rhythm with left atrial enlargement, poor R wave progression, and lateral T wave inversions.  No significant change from prior tracing on 05/23/2020.  Lab Results  Component Value Date   WBC 4.8 10/29/2020   HGB 11.0 (L) 10/29/2020   HCT 32.9 (L) 10/29/2020   MCV 92.9 10/29/2020   PLT 228 10/29/2020    Lab Results  Component Value Date   NA 143 05/23/2020   K 4.7 05/23/2020   CL 109 (H) 05/23/2020   CO2 17 (L) 05/23/2020   BUN 19 05/23/2020   CREATININE 1.02 (H) 05/23/2020   GLUCOSE 79 05/23/2020   ALT 19 12/02/2018    Lab Results  Component Value Date   CHOL 198 07/08/2018   HDL 75 07/08/2018   LDLCALC 110 (H) 07/08/2018   TRIG 66 07/08/2018   CHOLHDL 2.6 07/08/2018    --------------------------------------------------------------------------------------------------  ASSESSMENT AND PLAN: Hypertensive heart disease and essential hypertension Cardiac MR showed moderate focal basal hyperterophy of the septum.  No delayed hyperenhancement noted.  Findings most likely due to hypertensive heart disease.  Ms. Millikin does not have signs or symptoms of heart failure at this time (NYHA class I).  We will continue her current medications.  Blood pressure well-controlled in the office today and also typically at home.  Atypical chest pain: Rare episodes of transient, non-exertional chest pain reported.  EKG today shows stable  lateral T wave inversions.  MPI in 2020 was low risk without ischemia or scar.  We have agreed to defer additional workup at this time unless episodes become more frequent/severe.  Hyperlipidemia: Given coronary artery calcification and mild carotid artery plaquing, recommend escalation of statin therapy given LDL of 131 on last check by Dr. Baldemar Lenis in April.  We have agreed to switch from pravastatin to atorvastatin 40 mg daily, with repeat lipid panel and ALT in ~3 months.  Follow-up: Return to clinic in 6 months.  [name], MD 11/29/2020 9:24 PM

## 2020-11-29 ENCOUNTER — Encounter: Payer: Self-pay | Admitting: Internal Medicine

## 2021-01-24 ENCOUNTER — Other Ambulatory Visit: Payer: Self-pay | Admitting: Internal Medicine

## 2021-01-29 ENCOUNTER — Inpatient Hospital Stay: Payer: Medicare Other | Attending: Nurse Practitioner

## 2021-03-12 ENCOUNTER — Ambulatory Visit (INDEPENDENT_AMBULATORY_CARE_PROVIDER_SITE_OTHER): Payer: Medicare Other | Admitting: Gastroenterology

## 2021-03-12 ENCOUNTER — Encounter: Payer: Self-pay | Admitting: Gastroenterology

## 2021-03-12 VITALS — BP 171/70 | HR 58 | Temp 98.1°F | Wt 177.0 lb

## 2021-03-12 DIAGNOSIS — Z86010 Personal history of colonic polyps: Secondary | ICD-10-CM

## 2021-03-12 DIAGNOSIS — D509 Iron deficiency anemia, unspecified: Secondary | ICD-10-CM | POA: Diagnosis not present

## 2021-03-12 NOTE — Progress Notes (Signed)
[name], MD [address]  [place]  [place], [place] [address]  Main: [phone]  Fax: [phone]   Primary Care Physician: Derinda Late, MD   Chief Complaint  Patient presents with   Follow-up    1 yr 2 day prep colonoscopy    HPI: Natalie Knox is a 70 y.o. female previously seen for iron deficiency anemia here for follow-up.  Last colonoscopy had a fair prep and patient was recommended to have a repeat done in 1 year with 2-day prep and has not scheduled that yet.  Denies any abdominal pain, nausea or vomiting.  No dysphagia.  Is seeing hematology, and as per their last note from August 2022, anemia has improved and they are monitoring.  She completed EGD, colonoscopy and small bowel capsule study  Ferritin normal August 2022.  Serum iron normal August 2022  ROS: All ROS reviewed and negative except as per HPI   Past Medical History:  Diagnosis Date   Allergic rhinitis    Anemia    Atypical chest pain    a. 05/2009 Ex MV: EF 67%, no ischemia/infarct; b. 12/2018 MV: No ischemia/infarct. Cor Ca2+ and Aortic atherosclerosis noted. EF 55-65%.   Carotid arterial disease ([place])    a. 10/2019 Carotid U/S: 1-39% bilat ICA stenosis w/ <50% RECA and CCA stenoses.   Colon polyp    a. 11/2018 Colonscopy: 43mm polyp removed. Otw nl study.   Hard of hearing    has hearing aid   Hyperlipidemia    Hypertension    Iron deficiency anemia    a. 11/2018 s/p 1u prbcs-->EGD w/ erythema in antrum of stomach but otw nl. Colonoscopy notable for 66mm polyp.   LVH (left ventricular hypertrophy)    a. 11/2018 Echo: EF 60-65%, mod to sev LVH w/ near cavity obliteration in systole. Diast dysfxn. Nl RV size/fxn. Mildly elev PASP.   Neurosyphilis     Past Surgical History:  Procedure Laterality Date   COLONOSCOPY     COLONOSCOPY N/A 12/22/2019   Procedure: COLONOSCOPY;  Surgeon: Virgel Manifold, MD;  Location: [facility] ENDOSCOPY;  Service: Endoscopy;  Laterality: N/A;    ESOPHAGOGASTRODUODENOSCOPY N/A 12/21/2019   Procedure: ESOPHAGOGASTRODUODENOSCOPY (EGD);  Surgeon: Virgel Manifold, MD;  Location[facility] ENDOSCOPY;  Service: Endoscopy;  Laterality: N/A;   GIVENS CAPSULE STUDY N/A 12/22/2019   Procedure: GIVENS CAPSULE STUDY;  Surgeon: Virgel Manifold, MD;  Location: [facility] ENDOSCOPY;  Service: Endoscopy;  Laterality: N/A;  If colon finds nothing, needs a capsule endoscopy   GIVENS CAPSULE STUDY N/A 02/01/2020   Procedure: GIVENS CAPSULE STUDY;  Surgeon: Virgel Manifold, MD;  Location: [facility] ENDOSCOPY;  Service: Endoscopy;  Laterality: N/A;   UPPER GI ENDOSCOPY      Prior to Admission medications   Medication Sig Start Date End Date Taking? Authorizing Provider  amLODipine (NORVASC) 10 MG tablet Take 10 mg by mouth daily.   Yes [provider]  atorvastatin (LIPITOR) 40 MG tablet Take 1 tablet (40 mg total) by mouth daily. 11/28/20 11/23/21 Yes End, Harrell Gave, MD  carvedilol (COREG) 6.25 MG tablet Take 6.25 mg by mouth 2 (two) times daily with a meal.   Yes [provider]  donepezil (ARICEPT) 5 MG tablet Take 5 mg by mouth at bedtime.   Yes [provider]  lisinopril (PRINIVIL,ZESTRIL) 40 MG tablet Take 40 mg by mouth daily.   Yes [provider]  pantoprazole (PROTONIX) 40 MG tablet Take 40 mg by mouth daily.  10/21/19  Yes [provider]  spironolactone (ALDACTONE) 25 MG tablet TAKE 1 TABLET BY MOUTH DAILY 01/24/21  Yes End, Harrell Gave, MD  vitamin B-12 (CYANOCOBALAMIN) 1000 MCG tablet Take 1,000 mcg by mouth daily.   Yes [provider]    Family History  Problem Relation Age of Onset   Breast cancer Sister    Heart attack Mother 66     Social History   Tobacco Use   Smoking status: Former    Packs/day: 0.50    Years: 25.00    Pack years: 12.50    Types: Cigarettes    Quit date: 05/2017    Years since quitting: 3.8   Smokeless tobacco: Never  Vaping Use   Vaping Use: Never used   Substance Use Topics   Alcohol use: Not Currently    Comment: no drink since march   Drug use: No    Allergies as of 03/12/2021   (No Known Allergies)    Physical Examination:  Constitutional: General:   Alert,  Well-developed, well-nourished, pleasant and cooperative in NAD BP (!) 171/70    Pulse (!) 58    Temp 98.1 F (36.7 C) (Oral)    Wt 177 lb (80.3 kg)    BMI 30.38 kg/m   Respiratory: Normal respiratory effort  Gastrointestinal:  Soft, non-tender and non-distended without masses, hepatosplenomegaly or hernias noted.  No guarding or rebound tenderness.     Cardiac: No clubbing or edema.  No cyanosis. Normal posterior tibial pedal pulses noted.  Psych:  Alert and cooperative. Normal mood and affect.  Musculoskeletal:  Normal gait. Head normocephalic, atraumatic. Symmetrical without gross deformities. 5/5 Lower extremity strength bilaterally.  Skin: Warm. Intact without significant lesions or rashes. No jaundice.  Neck: Supple, trachea midline  Lymph: No cervical lymphadenopathy  Psych:  Alert and oriented x3, Alert and cooperative. Normal mood and affect.  Labs: CMP     Component Value Date/Time   NA 143 05/23/2020 1015   K 4.7 05/23/2020 1015   CL 109 (H) 05/23/2020 1015   CO2 17 (L) 05/23/2020 1015   GLUCOSE 79 05/23/2020 1015   GLUCOSE 96 12/21/2019 0423   BUN 19 05/23/2020 1015   CREATININE 1.02 (H) 05/23/2020 1015   CALCIUM 9.2 05/23/2020 1015   PROT 7.0 12/02/2018 0909   PROT 7.0 07/08/2018 1117   ALBUMIN 4.0 12/02/2018 0909   ALBUMIN 4.4 07/08/2018 1117   AST 21 12/02/2018 0909   ALT 19 12/02/2018 0909   ALKPHOS 75 12/02/2018 0909   BILITOT 0.5 12/02/2018 0909   BILITOT 0.3 07/08/2018 1117   GFRNONAA 49 (L) 12/21/2019 0423   GFRAA 57 (L) 12/21/2019 0423   Lab Results  Component Value Date   WBC 4.8 10/29/2020   HGB 11.0 (L) 10/29/2020   HCT 32.9 (L) 10/29/2020   MCV 92.9 10/29/2020   PLT 228 10/29/2020    Imaging  Studies:   Assessment and Plan:   Natalie Knox is a 70 y.o. y/o female with history of iron deficiency anemia here for follow-up with improvement in labs after iron replacement with hematology  Continue close follow-up with hematology Patient recommended to get repeat colonoscopy given fair prep on last exam.  Patient was given several dates to choose from but did not schedule today as she wants like to go home and discuss it with her daughter.  Patient noted the states and will discuss it with her daughter and call us back when she is ready to schedule  We  will also place a recall in her chart in case she does not call us back before then to schedule her colonoscopy  Importance of getting a timely colonoscopy done to evaluate for any underlying polyps discussed in detail  Dr [name]

## 2021-03-12 NOTE — Patient Instructions (Addendum)
Current available days, January 18, 27, 30 and February 2, 3, 6

## 2021-03-14 ENCOUNTER — Other Ambulatory Visit: Payer: Self-pay

## 2021-03-14 ENCOUNTER — Other Ambulatory Visit: Payer: Self-pay | Admitting: Gastroenterology

## 2021-03-14 MED ORDER — PEG 3350-KCL-NA BICARB-NACL 420 G PO SOLR: 8000.0000 mL | Freq: Once | ORAL | 0 refills | Status: DC

## 2021-03-14 NOTE — Progress Notes (Signed)
Corrected and resent prep medicine

## 2021-03-14 NOTE — Addendum Note (Signed)
Addended by: Lurlean Nanny on: 03/14/2021 11:26 AM   Modules accepted: Orders

## 2021-04-18 ENCOUNTER — Encounter: Payer: Self-pay | Admitting: Gastroenterology

## 2021-04-19 ENCOUNTER — Ambulatory Visit: Payer: Medicare Other | Admitting: Certified Registered"

## 2021-04-19 ENCOUNTER — Encounter: Payer: Self-pay | Admitting: Gastroenterology

## 2021-04-19 ENCOUNTER — Ambulatory Visit
Admission: RE | Admit: 2021-04-19 | Discharge: 2021-04-19 | Disposition: A | Discharge status: Home / Self Care | Payer: Medicare Other | Source: Ambulatory Visit | Attending: Gastroenterology | Admitting: Gastroenterology

## 2021-04-19 ENCOUNTER — Encounter
Admission: RE | Disposition: A | Discharge status: Home / Self Care | Payer: Self-pay | Source: Ambulatory Visit | Attending: Gastroenterology

## 2021-04-19 DIAGNOSIS — K5730 Diverticulosis of large intestine without perforation or abscess without bleeding: Secondary | ICD-10-CM | POA: Diagnosis not present

## 2021-04-19 DIAGNOSIS — D124 Benign neoplasm of descending colon: Secondary | ICD-10-CM | POA: Insufficient documentation

## 2021-04-19 DIAGNOSIS — Z1211 Encounter for screening for malignant neoplasm of colon: Secondary | ICD-10-CM | POA: Insufficient documentation

## 2021-04-19 DIAGNOSIS — Z87891 Personal history of nicotine dependence: Secondary | ICD-10-CM | POA: Insufficient documentation

## 2021-04-19 DIAGNOSIS — A523 Neurosyphilis, unspecified: Secondary | ICD-10-CM | POA: Insufficient documentation

## 2021-04-19 DIAGNOSIS — Z86010 Personal history of colonic polyps: Secondary | ICD-10-CM | POA: Diagnosis present

## 2021-04-19 DIAGNOSIS — K635 Polyp of colon: Secondary | ICD-10-CM

## 2021-04-19 DIAGNOSIS — I10 Essential (primary) hypertension: Secondary | ICD-10-CM | POA: Insufficient documentation

## 2021-04-19 DIAGNOSIS — E785 Hyperlipidemia, unspecified: Secondary | ICD-10-CM | POA: Diagnosis not present

## 2021-04-19 DIAGNOSIS — D122 Benign neoplasm of ascending colon: Secondary | ICD-10-CM | POA: Insufficient documentation

## 2021-04-19 DIAGNOSIS — D649 Anemia, unspecified: Secondary | ICD-10-CM | POA: Insufficient documentation

## 2021-04-19 HISTORY — PX: COLONOSCOPY WITH PROPOFOL: SHX5780

## 2021-04-19 SURGERY — COLONOSCOPY WITH PROPOFOL
Anesthesia: General

## 2021-04-19 MED ORDER — SODIUM CHLORIDE 0.9 % IV SOLN
INTRAVENOUS | Status: DC
  Administered 2021-04-19: 1000 mL via INTRAVENOUS

## 2021-04-19 MED ORDER — LIDOCAINE HCL (CARDIAC) PF 100 MG/5ML IV SOSY
PREFILLED_SYRINGE | INTRAVENOUS | Status: DC | PRN
Start: 2021-04-19 — End: 2021-04-19
  Administered 2021-04-19: 50 mg via INTRAVENOUS

## 2021-04-19 MED ORDER — GLYCOPYRROLATE 0.2 MG/ML IJ SOLN
INTRAMUSCULAR | Status: DC | PRN
  Administered 2021-04-19: .1 mg via INTRAVENOUS

## 2021-04-19 MED ORDER — PROPOFOL 500 MG/50ML IV EMUL
INTRAVENOUS | Status: DC | PRN
  Administered 2021-04-19: 100 ug/kg/min via INTRAVENOUS

## 2021-04-19 MED ORDER — LIDOCAINE HCL (PF) 1 % IJ SOLN
INTRAMUSCULAR | Status: AC
  Filled 2021-04-19: qty 2

## 2021-04-19 MED ORDER — DEXMEDETOMIDINE HCL IN NACL 200 MCG/50ML IV SOLN
INTRAVENOUS | Status: AC
  Filled 2021-04-19: qty 50

## 2021-04-19 MED ORDER — PROPOFOL 10 MG/ML IV BOLUS
INTRAVENOUS | Status: AC
  Filled 2021-04-19: qty 20

## 2021-04-19 MED ORDER — STERILE WATER FOR IRRIGATION IR SOLN: Status: DC | PRN

## 2021-04-19 MED ORDER — PROPOFOL 10 MG/ML IV BOLUS
INTRAVENOUS | Status: DC | PRN
  Administered 2021-04-19: 40 mg via INTRAVENOUS

## 2021-04-19 MED ORDER — PROPOFOL 500 MG/50ML IV EMUL
INTRAVENOUS | Status: AC
  Filled 2021-04-19: qty 50

## 2021-04-19 MED ORDER — DEXMEDETOMIDINE (PRECEDEX) IN NS 20 MCG/5ML (4 MCG/ML) IV SYRINGE
PREFILLED_SYRINGE | INTRAVENOUS | Status: DC | PRN
  Administered 2021-04-19 (×2): 4 ug via INTRAVENOUS

## 2021-04-19 NOTE — Op Note (Signed)
[facility] Gastroenterology Patient Name: Natalie Knox Procedure Date: 04/19/2021 9:59 AM MRN: [id number] Account #: [id number] Date of Birth: 05/24/1950 Admit Type: Outpatient Age: 71 Room: [facility] ENDO ROOM 2 Gender: Female Note Status: Finalized Instrument Name: Jasper Riling [id number] Procedure:             Colonoscopy Indications:           Surveillance: Personal history of adenomatous polyps                         on last colonoscopy 3 years ago, Surveillance: History                         of adenomatous polyps, inadequate prep on last exam                         (<19yr), Last colonoscopy: September 2021 Providers:             Lin Landsman MD, MD Referring MD:          Caprice Renshaw MD (Referring MD) Medicines:             General Anesthesia Complications:         No immediate complications. Estimated blood loss: None. Procedure:             Pre-Anesthesia Assessment:                        - Prior to the procedure, a History and Physical was                         performed, and patient medications and allergies were                         reviewed. The patient is competent. The risks and                         benefits of the procedure and the sedation options and                         risks were discussed with the patient. All questions                         were answered and informed consent was obtained.                         Patient identification and proposed procedure were                         verified by the physician, the nurse, the                         anesthesiologist, the anesthetist and the technician                         in the pre-procedure area in the procedure room in the                         endoscopy suite. Mental Status Examination: alert and  oriented. Airway Examination: normal oropharyngeal                         airway and neck mobility. Respiratory Examination:                          clear to auscultation. CV Examination: normal.                         Prophylactic Antibiotics: The patient does not require                         prophylactic antibiotics. Prior Anticoagulants: The                         patient has taken no previous anticoagulant or                         antiplatelet agents. ASA Grade Assessment: III - A                         patient with severe systemic disease. After reviewing                         the risks and benefits, the patient was deemed in                         satisfactory condition to undergo the procedure. The                         anesthesia plan was to use general anesthesia.                         Immediately prior to administration of medications,                         the patient was re-assessed for adequacy to receive                         sedatives. The heart rate, respiratory rate, oxygen                         saturations, blood pressure, adequacy of pulmonary                         ventilation, and response to care were monitored                         throughout the procedure. The physical status of the                         patient was re-assessed after the procedure.                        After obtaining informed consent, the colonoscope was                         passed under direct vision. Throughout the procedure,  the patient's blood pressure, pulse, and oxygen                         saturations were monitored continuously. The                         Colonoscope was introduced through the anus and                         advanced to the the cecum, identified by appendiceal                         orifice and ileocecal valve. The colonoscopy was                         performed without difficulty. The patient tolerated                         the procedure well. The quality of the bowel                         preparation was evaluated using the BBPS [facility] Bowel                          Preparation Scale) with scores of: Right Colon = 3,                         Transverse Colon = 3 and Left Colon = 3 (entire mucosa                         seen well with no residual staining, small fragments                         of stool or opaque liquid). The total BBPS score                         equals 9. Findings:      The perianal and digital rectal examinations were normal. Pertinent       negatives include normal sphincter tone and no palpable rectal lesions.      Seven sessile polyps were found in the descending colon and ascending       colon. The polyps were 3 to 5 mm in size. These polyps were removed with       a cold snare. Resection and retrieval were complete. Estimated blood       loss: none.      Multiple small and large-mouthed diverticula were found in the sigmoid       colon.      The retroflexed view of the distal rectum and anal verge was normal and       showed no anal or rectal abnormalities. Impression:            - Seven 3 to 5 mm polyps in the descending colon and                         in the ascending colon, removed with a cold snare.  Resected and retrieved.                        - Diverticulosis in the sigmoid colon.                        - The distal rectum and anal verge are normal on                         retroflexion view. Recommendation:        - Discharge patient to home (with escort).                        - Resume previous diet today.                        - Continue present medications.                        - Await pathology results.                        - Repeat colonoscopy in 3 years for surveillance of                         multiple polyps. Procedure Code(s):     --- Professional ---                        [id number], Colonoscopy, flexible; with removal of                         tumor(s), polyp(s), or other lesion(s) by snare                         technique Diagnosis Code(s):     ---  Professional ---                        K63.5, Polyp of colon                        Z86.010, Personal history of colonic polyps                        K57.30, Diverticulosis of large intestine without                         perforation or abscess without bleeding CPT copyright 2019 [employer] Medical Association. All rights reserved. The codes documented in this report are preliminary and upon coder review may  be revised to meet current compliance requirements. Dr. Ulyess Mort Lin Landsman MD, MD 04/19/2021 10:48:46 AM This report has been signed electronically. Number of Addenda: 0 Note Initiated On: 04/19/2021 9:59 AM Scope Withdrawal Time: 0 hours 20 minutes 52 seconds  Total Procedure Duration: 0 hours 23 minutes 38 seconds  Estimated Blood Loss:  Estimated blood loss: none.      [facility]

## 2021-04-19 NOTE — Anesthesia Preprocedure Evaluation (Addendum)
Anesthesia Evaluation  Patient identified by MRN, date of birth, ID band Patient awake    Reviewed: Allergy & Precautions, NPO status , Patient's Chart, lab work & pertinent test results  Airway Mallampati: III  TM Distance: >3 FB Neck ROM: full    Dental  (+) Chipped, Partial Lower, Missing   Pulmonary neg pulmonary ROS, neg shortness of breath, former smoker,    Pulmonary exam normal        Cardiovascular Exercise Tolerance: Poor METS: 3 - Mets hypertension, Pt. on medications (-) DOE Normal cardiovascular exam  ECHO 11/2018: 1. Left ventricular ejection fraction, by visual estimation, is 60 to  65%. The left ventricle has normal function. Normal left ventricular size.  There is moderate to severely increased left ventricular hypertrophy.Near  cavity obliteration in systole.  Unable to exclude mild LVOT gradient (not measured)  2. Left ventricular diastolic Doppler parameters are consistent with  impaired relaxation pattern of LV diastolic filling.  3. Global right ventricle has normal systolic function.The right  ventricular size is normal. No increase in right ventricular wall  thickness.  4. Mildly elevated pulmonary artery systolic pressure.  5. Left atrial size was normal.  Chest discomfort around 3 months ago with cataract surgery. No chest pain since procedure. Denies SOB. Saw cardiologist in September and no further ischemic work-up was indicated.     Neuro/Psych neurosyphilis negative neurological ROS  negative psych ROS   GI/Hepatic negative GI ROS, Neg liver ROS,   Endo/Other  negative endocrine ROS  Renal/GU CRFRenal diseasenegative Renal ROS  negative genitourinary   Musculoskeletal   Abdominal Normal abdominal exam  (+)   Peds  Hematology negative hematology ROS (+) Blood dyscrasia, anemia ,   Anesthesia Other Findings Past Medical History: No date: Allergic rhinitis No date: Anemia No  date: Atypical chest pain     Comment:  a. 05/2009 Ex MV: EF 67%, no ischemia/infarct; b. 12/2018              MV: No ischemia/infarct. Cor Ca2+ and Aortic               atherosclerosis noted. EF 55-65%. No date: Carotid arterial disease ([place])     Comment:  a. 10/2019 Carotid U/S: 1-39% bilat ICA stenosis w/ <50%               RECA and CCA stenoses. No date: Colon polyp     Comment:  a. 11/2018 Colonscopy: 90mm polyp removed. Otw nl study. No date: Hard of hearing     Comment:  has hearing aid No date: Hyperlipidemia No date: Hypertension No date: Iron deficiency anemia     Comment:  a. 11/2018 s/p 1u prbcs-->EGD w/ erythema in antrum of               stomach but otw nl. Colonoscopy notable for 27mm polyp. No date: LVH (left ventricular hypertrophy)     Comment:  a. 11/2018 Echo: EF 60-65%, mod to sev LVH w/ near cavity              obliteration in systole. Diast dysfxn. Nl RV size/fxn.               Mildly elev PASP. No date: Neurosyphilis  Past Surgical History: No date: COLONOSCOPY 12/22/2019: COLONOSCOPY; N/A     Comment:  Procedure: COLONOSCOPY;  Surgeon: Virgel Manifold,               MD;  Location: [facility] ENDOSCOPY;  Service: Endoscopy;  Laterality: N/A; 12/21/2019: ESOPHAGOGASTRODUODENOSCOPY; N/A     Comment:  Procedure: ESOPHAGOGASTRODUODENOSCOPY (EGD);  Surgeon:               Virgel Manifold, MD;  Location: [facility] ENDOSCOPY;                Service: Endoscopy;  Laterality: N/A; 12/22/2019: GIVENS CAPSULE STUDY; N/A     Comment:  Procedure: GIVENS CAPSULE STUDY;  Surgeon: Virgel Manifold, MD;  Location: [facility] ENDOSCOPY;  Service:               Endoscopy;  Laterality: N/A;  If colon finds nothing,               needs a capsule endoscopy 02/01/2020: GIVENS CAPSULE STUDY; N/A     Comment:  Procedure: GIVENS CAPSULE STUDY;  Surgeon: Virgel Manifold, MD;  Location: [facility] ENDOSCOPY;  Service:               Endoscopy;  Laterality:  N/A; No date: UPPER GI ENDOSCOPY  BMI    Body Mass Index: 29.93 kg/m      Reproductive/Obstetrics negative OB ROS                            Anesthesia Physical Anesthesia Plan  ASA: 3  Anesthesia Plan: General   Post-op Pain Management:    Induction: Intravenous  PONV Risk Score and Plan: Propofol infusion and TIVA  Airway Management Planned: Natural Airway and Nasal Cannula  Additional Equipment:   Intra-op Plan:   Post-operative Plan:   Informed Consent: I have reviewed the patients History and Physical, chart, labs and discussed the procedure including the risks, benefits and alternatives for the proposed anesthesia with the patient or authorized representative who has indicated his/her understanding and acceptance.     Dental Advisory Given  Plan Discussed with: Anesthesiologist, CRNA and Surgeon  Anesthesia Plan Comments:         Anesthesia Quick Evaluation

## 2021-04-19 NOTE — Transfer of Care (Signed)
Immediate Anesthesia Transfer of Care Note  Patient: Natalie Knox  Procedure(s) Performed: COLONOSCOPY WITH PROPOFOL  Patient Location: PACU and Endoscopy Unit  Anesthesia Type:General  Level of Consciousness: drowsy and patient cooperative  Airway & Oxygen Therapy: Patient Spontanous Breathing  Post-op Assessment: Report given to RN and Post -op Vital signs reviewed and stable  Post vital signs: Reviewed and stable  Last Vitals:  Vitals Value Taken Time  BP 126/47 04/19/21 1051  Temp 35.6 C 04/19/21 1049  Pulse 52 04/19/21 1051  Resp 18 04/19/21 1051  SpO2 99 % 04/19/21 1051  Vitals shown include unvalidated device data.  Last Pain:  Vitals:   04/19/21 1049  TempSrc: Temporal  PainSc: Asleep         Complications: No notable events documented.

## 2021-04-19 NOTE — H&P (Signed)
Cephas Darby, MD [address]  [place]  [place], [place] [address]  Main: [phone]  Fax: [phone] Pager: [phone]  Primary Care Physician:  Derinda Late, MD Primary Gastroenterologist:  Dr. Cephas Darby  Pre-Procedure History & Physical: HPI:  Natalie Knox is a 71 y.o. female is here for an colonoscopy.   Past Medical History:  Diagnosis Date   Allergic rhinitis    Anemia    Atypical chest pain    a. 05/2009 Ex MV: EF 67%, no ischemia/infarct; b. 12/2018 MV: No ischemia/infarct. Cor Ca2+ and Aortic atherosclerosis noted. EF 55-65%.   Carotid arterial disease ([place])    a. 10/2019 Carotid U/S: 1-39% bilat ICA stenosis w/ <50% RECA and CCA stenoses.   Colon polyp    a. 11/2018 Colonscopy: 11mm polyp removed. Otw nl study.   Hard of hearing    has hearing aid   Hyperlipidemia    Hypertension    Iron deficiency anemia    a. 11/2018 s/p 1u prbcs-->EGD w/ erythema in antrum of stomach but otw nl. Colonoscopy notable for 32mm polyp.   LVH (left ventricular hypertrophy)    a. 11/2018 Echo: EF 60-65%, mod to sev LVH w/ near cavity obliteration in systole. Diast dysfxn. Nl RV size/fxn. Mildly elev PASP.   Neurosyphilis     Past Surgical History:  Procedure Laterality Date   COLONOSCOPY     COLONOSCOPY N/A 12/22/2019   Procedure: COLONOSCOPY;  Surgeon: Virgel Manifold, MD;  Location: [facility] ENDOSCOPY;  Service: Endoscopy;  Laterality: N/A;   ESOPHAGOGASTRODUODENOSCOPY N/A 12/21/2019   Procedure: ESOPHAGOGASTRODUODENOSCOPY (EGD);  Surgeon: Virgel Manifold, MD;  Location: [facility] ENDOSCOPY;  Service: Endoscopy;  Laterality: N/A;   GIVENS CAPSULE STUDY N/A 12/22/2019   Procedure: GIVENS CAPSULE STUDY;  Surgeon: Virgel Manifold, MD;  Location: [facility] ENDOSCOPY;  Service: Endoscopy;  Laterality: N/A;  If colon finds nothing, needs a capsule endoscopy   GIVENS CAPSULE STUDY N/A 02/01/2020   Procedure: GIVENS CAPSULE STUDY;  Surgeon: Virgel Manifold, MD;   Location: [facility] ENDOSCOPY;  Service: Endoscopy;  Laterality: N/A;   UPPER GI ENDOSCOPY      Prior to Admission medications   Medication Sig Start Date End Date Taking? Authorizing Provider  amLODipine (NORVASC) 10 MG tablet Take 10 mg by mouth daily.   Yes [provider]  atorvastatin (LIPITOR) 40 MG tablet Take 1 tablet (40 mg total) by mouth daily. 11/28/20 11/23/21 Yes End, Harrell Gave, MD  carvedilol (COREG) 6.25 MG tablet Take 6.25 mg by mouth 2 (two) times daily with a meal.   Yes [provider]  donepezil (ARICEPT) 5 MG tablet Take 5 mg by mouth at bedtime.   Yes [provider]  lisinopril (PRINIVIL,ZESTRIL) 40 MG tablet Take 40 mg by mouth daily.   Yes [provider]  pantoprazole (PROTONIX) 40 MG tablet Take 40 mg by mouth daily. 10/21/19  Yes [provider]  spironolactone (ALDACTONE) 25 MG tablet TAKE 1 TABLET BY MOUTH DAILY 01/24/21  Yes End, Harrell Gave, MD  vitamin B-12 (CYANOCOBALAMIN) 1000 MCG tablet Take 1,000 mcg by mouth daily.   Yes [provider]    Allergies as of 03/14/2021   (No Known Allergies)    Family History  Problem Relation Age of Onset   Breast cancer Sister    Heart attack Mother 48    Social History   Socioeconomic History   Marital status: Single    Spouse name: Not on file   Number of  children: Not on file   Years of education: Not on file   Highest education level: Not on file  Occupational History   Not on file  Tobacco Use   Smoking status: Former    Packs/day: 0.50    Years: 25.00    Pack years: 12.50    Types: Cigarettes    Quit date: 05/2017    Years since quitting: 3.9   Smokeless tobacco: Never  Vaping Use   Vaping Use: Never used  Substance and Sexual Activity   Alcohol use: Not Currently    Comment: no drink since march   Drug use: No   Sexual activity: Not on file  Other Topics Concern   Not on file  Social History Narrative   Not on file   Social Determinants  of Health   Financial Resource Strain: Not on file  Food Insecurity: No Food Insecurity   Worried About Charity fundraiser in the Last Year: Never true   Ran Out of Food in the Last Year: Never true  Transportation Needs: Unmet Transportation Needs   Lack of Transportation (Medical): Yes   Lack of Transportation (Non-Medical): Yes  Physical Activity: Not on file  Stress: Not on file  Social Connections: Not on file  Intimate Partner Violence: Not on file    Review of Systems: See HPI, otherwise negative ROS  Physical Exam: BP (!) 165/58    Pulse (!) 50    Temp (!) 96.7 F (35.9 C) (Temporal)    Resp 18    Ht 5\' 4"  (1.626 m)    Wt 79.1 kg    SpO2 100%    BMI 29.93 kg/m  General:   Alert,  pleasant and cooperative in NAD Head:  Normocephalic and atraumatic. Neck:  Supple; no masses or thyromegaly. Lungs:  Clear throughout to auscultation.    Heart:  Regular rate and rhythm. Abdomen:  Soft, nontender and nondistended. Normal bowel sounds, without guarding, and without rebound.   Neurologic:  Alert and  oriented x4;  grossly normal neurologically.  Impression/Plan: Natalie Knox is here for an colonoscopy to be performed for h/o adenoma colon  Risks, benefits, limitations, and alternatives regarding  colonoscopy have been reviewed with the patient.  Questions have been answered.  All parties agreeable.   Sherri Sear, MD  04/19/2021, 10:00 AM

## 2021-04-19 NOTE — Anesthesia Postprocedure Evaluation (Signed)
Anesthesia Post Note  Patient: Natalie Knox  Procedure(s) Performed: COLONOSCOPY WITH PROPOFOL  Patient location during evaluation: Endoscopy Anesthesia Type: General Level of consciousness: awake and alert Pain management: pain level controlled Vital Signs Assessment: post-procedure vital signs reviewed and stable Respiratory status: spontaneous breathing, nonlabored ventilation and respiratory function stable Cardiovascular status: blood pressure returned to baseline and stable Postop Assessment: no apparent nausea or vomiting Anesthetic complications: no   No notable events documented.   Last Vitals:  Vitals:   04/19/21 0926 04/19/21 1049  BP: (!) 165/58 (!) 126/47  Pulse: (!) 50   Resp: 18   Temp: (!) 35.9 C (!) 35.6 C  SpO2: 100%     Last Pain:  Vitals:   04/19/21 1119  TempSrc:   PainSc: 0-No pain                 Iran Ouch

## 2021-04-21 NOTE — Progress Notes (Signed)
Non-identified Voicemail.  No Message Left. 

## 2021-04-22 LAB — SURGICAL PATHOLOGY

## 2021-04-23 ENCOUNTER — Encounter: Payer: Self-pay | Admitting: Gastroenterology

## 2021-04-23 ENCOUNTER — Other Ambulatory Visit: Payer: Self-pay | Admitting: Internal Medicine

## 2021-04-25 ENCOUNTER — Other Ambulatory Visit: Payer: Self-pay | Admitting: *Deleted

## 2021-04-25 DIAGNOSIS — D509 Iron deficiency anemia, unspecified: Secondary | ICD-10-CM

## 2021-05-02 ENCOUNTER — Encounter: Payer: Self-pay | Admitting: Nurse Practitioner

## 2021-05-02 ENCOUNTER — Inpatient Hospital Stay (HOSPITAL_BASED_OUTPATIENT_CLINIC_OR_DEPARTMENT_OTHER): Payer: Medicare Other | Admitting: Nurse Practitioner

## 2021-05-02 ENCOUNTER — Other Ambulatory Visit: Payer: Self-pay

## 2021-05-02 ENCOUNTER — Inpatient Hospital Stay: Payer: Medicare Other | Attending: Nurse Practitioner

## 2021-05-02 VITALS — BP 107/46 | HR 50 | Temp 98.2°F | Resp 18 | Wt 171.9 lb

## 2021-05-02 DIAGNOSIS — Z79899 Other long term (current) drug therapy: Secondary | ICD-10-CM | POA: Diagnosis not present

## 2021-05-02 DIAGNOSIS — D509 Iron deficiency anemia, unspecified: Secondary | ICD-10-CM | POA: Insufficient documentation

## 2021-05-02 DIAGNOSIS — E785 Hyperlipidemia, unspecified: Secondary | ICD-10-CM | POA: Insufficient documentation

## 2021-05-02 DIAGNOSIS — Z803 Family history of malignant neoplasm of breast: Secondary | ICD-10-CM | POA: Diagnosis not present

## 2021-05-02 DIAGNOSIS — Z8249 Family history of ischemic heart disease and other diseases of the circulatory system: Secondary | ICD-10-CM | POA: Diagnosis not present

## 2021-05-02 DIAGNOSIS — Z87891 Personal history of nicotine dependence: Secondary | ICD-10-CM | POA: Insufficient documentation

## 2021-05-02 DIAGNOSIS — I10 Essential (primary) hypertension: Secondary | ICD-10-CM | POA: Diagnosis not present

## 2021-05-02 DIAGNOSIS — D508 Other iron deficiency anemias: Secondary | ICD-10-CM

## 2021-05-02 LAB — CBC
HCT: 36 % (ref 36.0–46.0)
Hemoglobin: 11.7 g/dL — ABNORMAL LOW (ref 12.0–15.0)
MCH: 30.5 pg (ref 26.0–34.0)
MCHC: 32.5 g/dL (ref 30.0–36.0)
MCV: 93.8 fL (ref 80.0–100.0)
Platelets: 249 10*3/uL (ref 150–400)
RBC: 3.84 MIL/uL — ABNORMAL LOW (ref 3.87–5.11)
RDW: 13.6 % (ref 11.5–15.5)
WBC: 4.9 10*3/uL (ref 4.0–10.5)
nRBC: 0 % (ref 0.0–0.2)

## 2021-05-02 LAB — FERRITIN: Ferritin: 198 ng/mL (ref 11–307)

## 2021-05-02 LAB — IRON AND TIBC
Iron: 94 ug/dL (ref 28–170)
Saturation Ratios: 37 % — ABNORMAL HIGH (ref 10.4–31.8)
TIBC: 256 ug/dL (ref 250–450)
UIBC: 162 ug/dL

## 2021-05-02 NOTE — Progress Notes (Signed)
Patient here for follow up appointment.  No complaints.

## 2021-05-02 NOTE — Progress Notes (Signed)
Hematology/Oncology Consult Note [facility]  [phone] [phone]  Patient Care Team: Derinda Late, MD as PCP - General (Family Medicine) End, Harrell Gave, MD as PCP - Cardiology (Cardiology)   Name of the patient: Natalie Knox  [id number]  Jul 06, 1950   Date of visit: 05/02/21  Diagnosis-iron deficiency anemia  Chief complaint/ Reason for visit-routine follow-up of iron deficiency anemia  Heme/Onc history: patient is a 71 year old African-American female With a past medical history significant for hypertension hyperlipidemia was recently admitted to the hospital for acute blood loss anemia.  She had EGD and colonoscopy showed erythema in the antrum of the stomach but did not reveal any cause of bleeding.  She has not yet completed her capsule study.  She was found to have a hemoglobin of 6.5 on admission with iron studies indicative of iron deficiency.  Patient was given 1 unit of blood transfusion and 1 dose of Venofer.  Her hemoglobin on discharge was 8.6.  Patient received 2 doses of Feraheme in November 2021. Hemoglobin since then has remained stable around 11.  Interval history-patient has baseline fatigue but denies other new complaints at this time.  Denies any bleeding in her stool or urine.  Denies any dark melanotic stools. She was previously taking oral iron but currently off. She wasn't sure if she should continue.   ECOG PS- 1 Pain scale- 0   Review of systems- Review of Systems  Constitutional:  Negative for chills, fever, malaise/fatigue and weight loss.  HENT:  Negative for congestion, ear discharge and nosebleeds.   Eyes:  Negative for blurred vision.  Respiratory:  Negative for cough, hemoptysis, sputum production, shortness of breath and wheezing.   Cardiovascular:  Negative for chest pain, palpitations, orthopnea and claudication.  Gastrointestinal:  Negative for abdominal pain, blood in stool, constipation, diarrhea,  heartburn, melena, nausea and vomiting.  Genitourinary:  Negative for dysuria, flank pain, frequency, hematuria and urgency.  Musculoskeletal:  Negative for back pain, joint pain and myalgias.  Skin:  Negative for rash.  Neurological:  Negative for dizziness, tingling, focal weakness, seizures, weakness and headaches.  Endo/Heme/Allergies:  Does not bruise/bleed easily.  Psychiatric/Behavioral:  Negative for depression and suicidal ideas. The patient does not have insomnia.      No Known Allergies   Past Medical History:  Diagnosis Date   Allergic rhinitis    Anemia    Atypical chest pain    a. 05/2009 Ex MV: EF 67%, no ischemia/infarct; b. 12/2018 MV: No ischemia/infarct. Cor Ca2+ and Aortic atherosclerosis noted. EF 55-65%.   Carotid arterial disease ([place])    a. 10/2019 Carotid U/S: 1-39% bilat ICA stenosis w/ <50% RECA and CCA stenoses.   Colon polyp    a. 11/2018 Colonscopy: 69mm polyp removed. Otw nl study.   Hard of hearing    has hearing aid   Hyperlipidemia    Hypertension    Iron deficiency anemia    a. 11/2018 s/p 1u prbcs-->EGD w/ erythema in antrum of stomach but otw nl. Colonoscopy notable for 54mm polyp.   LVH (left ventricular hypertrophy)    a. 11/2018 Echo: EF 60-65%, mod to sev LVH w/ near cavity obliteration in systole. Diast dysfxn. Nl RV size/fxn. Mildly elev PASP.   Neurosyphilis      Past Surgical History:  Procedure Laterality Date   COLONOSCOPY     COLONOSCOPY N/A 12/22/2019   Procedure: COLONOSCOPY;  Surgeon: Virgel Manifold, MD;  Location: [facility] ENDOSCOPY;  Service: Endoscopy;  Laterality: N/A;  COLONOSCOPY WITH PROPOFOL N/A 04/19/2021   Procedure: COLONOSCOPY WITH PROPOFOL;  Surgeon: Lin Landsman, MD;  Location: [facility] ENDOSCOPY;  Service: Gastroenterology;  Laterality: N/A;   ESOPHAGOGASTRODUODENOSCOPY N/A 12/21/2019   Procedure: ESOPHAGOGASTRODUODENOSCOPY (EGD);  Surgeon: Virgel Manifold, MD;  Location: [facility] ENDOSCOPY;  Service: Endoscopy;   Laterality: N/A;   GIVENS CAPSULE STUDY N/A 12/22/2019   Procedure: GIVENS CAPSULE STUDY;  Surgeon: Virgel Manifold, MD;  Location: [facility] ENDOSCOPY;  Service: Endoscopy;  Laterality: N/A;  If colon finds nothing, needs a capsule endoscopy   GIVENS CAPSULE STUDY N/A 02/01/2020   Procedure: GIVENS CAPSULE STUDY;  Surgeon: Virgel Manifold, MD;  Location: [facility] ENDOSCOPY;  Service: Endoscopy;  Laterality: N/A;   UPPER GI ENDOSCOPY      Social History   Socioeconomic History   Marital status: Single    Spouse name: Not on file   Number of children: Not on file   Years of education: Not on file   Highest education level: Not on file  Occupational History   Not on file  Tobacco Use   Smoking status: Former    Packs/day: 0.50    Years: 25.00    Pack years: 12.50    Types: Cigarettes    Quit date: 05/2017    Years since quitting: 3.9   Smokeless tobacco: Never  Vaping Use   Vaping Use: Never used  Substance and Sexual Activity   Alcohol use: Not Currently    Comment: no drink since march   Drug use: No   Sexual activity: Not on file  Other Topics Concern   Not on file  Social History Narrative   Not on file   Social Determinants of Health   Financial Resource Strain: Not on file  Food Insecurity: No Food Insecurity   Worried About Charity fundraiser in the Last Year: Never true   Ran Out of Food in the Last Year: Never true  Transportation Needs: Unmet Transportation Needs   Lack of Transportation (Medical): Yes   Lack of Transportation (Non-Medical): Yes  Physical Activity: Not on file  Stress: Not on file  Social Connections: Not on file  Intimate Partner Violence: Not on file    Family History  Problem Relation Age of Onset   Breast cancer Sister    Heart attack Mother 65     Current Outpatient Medications:    amLODipine (NORVASC) 10 MG tablet, Take 10 mg by mouth daily., Disp: , Rfl:    atorvastatin (LIPITOR) 40 MG tablet, Take 1 tablet (40 mg total)  by mouth daily., Disp: 90 tablet, Rfl: 3   carvedilol (COREG) 6.25 MG tablet, Take 6.25 mg by mouth 2 (two) times daily with a meal., Disp: , Rfl:    donepezil (ARICEPT) 5 MG tablet, Take 5 mg by mouth at bedtime., Disp: , Rfl:    lisinopril (PRINIVIL,ZESTRIL) 40 MG tablet, Take 40 mg by mouth daily., Disp: , Rfl:    pantoprazole (PROTONIX) 40 MG tablet, Take 40 mg by mouth daily., Disp: , Rfl:    spironolactone (ALDACTONE) 25 MG tablet, TAKE 1 TABLET BY MOUTH EVERY DAY, Disp: 90 tablet, Rfl: 0   vitamin B-12 (CYANOCOBALAMIN) 1000 MCG tablet, Take 1,000 mcg by mouth daily., Disp: , Rfl:   Physical exam:  Vitals:   05/02/21 1317  BP: (!) 107/46  Pulse: (!) 50  Resp: 18  Temp: 98.2 F (36.8 C)  TempSrc: Tympanic  SpO2: 99%  Weight: 171 lb 14.4 oz (78 kg)  Physical Exam Constitutional:      General: She is not in acute distress. Cardiovascular:     Rate and Rhythm: Normal rate and regular rhythm.     Heart sounds: Normal heart sounds.  Pulmonary:     Effort: Pulmonary effort is normal.     Breath sounds: Normal breath sounds.  Abdominal:     General: Bowel sounds are normal.     Palpations: Abdomen is soft.  Skin:    General: Skin is warm and dry.  Neurological:     Mental Status: She is alert and oriented to person, place, and time.     CMP Latest Ref Rng & Units 05/23/2020  Glucose 65 - 99 mg/dL 79  BUN 8 - 27 mg/dL 19  Creatinine 0.57 - 1.00 mg/dL 1.02(H)  Sodium 134 - 144 mmol/L 143  Potassium 3.5 - 5.2 mmol/L 4.7  Chloride 96 - 106 mmol/L 109(H)  CO2 20 - 29 mmol/L 17(L)  Calcium 8.7 - 10.3 mg/dL 9.2  Total Protein 6.5 - 8.1 g/dL -  Total Bilirubin 0.3 - 1.2 mg/dL -  Alkaline Phos 38 - 126 U/L -  AST 15 - 41 U/L -  ALT 0 - 44 U/L -   CBC Latest Ref Rng & Units 05/02/2021  WBC 4.0 - 10.5 K/uL 4.9  Hemoglobin 12.0 - 15.0 g/dL 11.7(L)  Hematocrit 36.0 - 46.0 % 36.0  Platelets 150 - 400 K/uL 249   Iron/TIBC/Ferritin/ %Sat    Component Value Date/Time   IRON 91  10/29/2020 0912   TIBC 256 10/29/2020 0912   FERRITIN 218 10/29/2020 0912   IRONPCTSAT 36 (H) 10/29/2020 0912     No images are attached to the encounter.  No results found.   Assessment and plan- Patient is a 71 y.o. female with iron deficiency anemia here for routine follow-up  Patient's hemoglobin is presently stable around 11.7. Iron studies pending at time of visit but previously have been normal. She does not require feraheme at this time.     3 mo- lab only (cbc, ferritin, iron studies) 6 mo- lab Day to week later see Dr Janese Banks (virtual visit if patient prefers)- [place]   Visit Diagnosis 1. Iron deficiency anemia, unspecified iron deficiency anemia type     Beckey Rutter, DNP, AGNP-C [place] [facility] [phone] (clinic) 05/02/2021

## 2021-05-30 ENCOUNTER — Ambulatory Visit (INDEPENDENT_AMBULATORY_CARE_PROVIDER_SITE_OTHER): Payer: Medicare Other | Admitting: Nurse Practitioner

## 2021-05-30 ENCOUNTER — Encounter: Payer: Self-pay | Admitting: Internal Medicine

## 2021-05-30 ENCOUNTER — Other Ambulatory Visit: Payer: Self-pay

## 2021-05-30 VITALS — BP 104/50 | HR 54 | Ht 64.0 in | Wt 171.0 lb

## 2021-05-30 DIAGNOSIS — I517 Cardiomegaly: Secondary | ICD-10-CM

## 2021-05-30 DIAGNOSIS — E785 Hyperlipidemia, unspecified: Secondary | ICD-10-CM

## 2021-05-30 DIAGNOSIS — I10 Essential (primary) hypertension: Secondary | ICD-10-CM | POA: Diagnosis not present

## 2021-05-30 NOTE — Patient Instructions (Signed)
Medication Instructions:   Your physician recommends that you continue on your current medications as directed. Please refer to the Current Medication list given to you today.  *If you need a refill on your cardiac medications before your next appointment, please call your pharmacy*   Lab Work: None ordered    Testing/Procedures: None ordered   Follow-Up: At [facility], you and your health needs are our priority.  As part of our continuing mission to provide you with exceptional heart care, we have created designated Provider Care Teams.  These Care Teams include your primary Cardiologist (physician) and Advanced Practice Providers (APPs -  Physician Assistants and Nurse Practitioners) who all work together to provide you with the care you need, when you need it.  We recommend signing up for the patient portal called "MyChart".  Sign up information is provided on this After Visit Summary.  MyChart is used to connect with patients for Virtual Visits (Telemedicine).  Patients are able to view lab/test results, encounter notes, upcoming appointments, etc.  Non-urgent messages can be sent to your provider as well.   To learn more about what you can do with MyChart, go to [email or web address].    Your next appointment:   6 month(s)  The format for your next appointment:   In Person  Provider:   You may see Christopher End, MD or one of the following Advanced Practice Providers on your designated Care Team:    Christopher Berge, NP   

## 2021-05-30 NOTE — Progress Notes (Signed)
? ?Follow-up Outpatient Visit ?Date: 05/30/2021 ? ?Primary Care Provider: ?Derinda Late, MD ?[facility] ?[place] [address] ? ?Chief Complaint: can feel her heart beating ? ?HPI:  Natalie Knox is a 71 y.o. female with history of hypertension, hyperlipidemia, iron deficiency anemia, and neurosyphilis, who presents for follow-up of hypertensive heart disease.  She was last seen in cardiology clinic in 11/2020, at which time she noted few episodes of nonexertional left-sided chest pain.  Due to suboptimal lipid control in the setting of coronary artery calcification and mild carotid plaquing, she was switched from pravastatin to atorvastatin 40 mg daily.  Lipid panel was improved on last check in 12/2020 by Natalie Knox, with an LDL of 96 (down from 131).  Since her last visit, she has done reasonably well.  She has been exercising at a fitness center about twice a week without symptoms or limitations.  She does note that sometimes in the evening, when she is lying in bed, if she rests her hand on her chest, that she can feel her heart beating.  She did not know if that was normal, as she had never really noticed it before.  She does not experience tachycardia, just simply notes that her heart is beating under her chest.  She denies palpitations, chest pain, dyspnea, PND, orthopnea, dizziness, syncope, edema, or early satiety. ? ?-------------------------------------------------------------------------------------------------- ? ?Cardiovascular History & Procedures: ?Cardiovascular Problems: ?Heart murmur ?Pericardial effusion ?Coronary artery calcification ?  ?Risk Factors: ?Hypertension, prior tobacco use, and age greater than 35 ?  ?Cath/PCI: ?None ?  ?CV Surgery: ?None ?  ?EP Procedures and Devices: ?None ?  ?Non-Invasive Evaluation(s): ?Cardiac MRI (07/16/2020): Moderate asymmetric LVH with basal septal hypertrophy measuring up to 1.5 cm.  Normal LVEF.  Normal  RV size and function.  No delayed hyperenhancement.  No significant valvular abnormality. ?Pharmacologic MPI (12/28/2018): Normal study without ischemia or scar.  LVEF 55-65%.  Coronary artery calcification and aortic atherosclerosis noted. ?TTE (12/09/2018): Technically difficult study.  Normal LV size with moderate-severe LVH.  LVEF 60-65%.  Grade 1 diastolic dysfunction.  Normal live RV size and wall thickness.  Mild pulmonary hypertension.  Small pericardial effusion. ? ?Recent CV Pertinent Labs: ?Lab Results  ?Component Value Date  ? CHOL 198 07/08/2018  ? HDL 75 07/08/2018  ? LDLCALC 110 (H) 07/08/2018  ? TRIG 66 07/08/2018  ? CHOLHDL 2.6 07/08/2018  ? K 4.7 05/23/2020  ? BUN 19 05/23/2020  ? CREATININE 1.02 (H) 05/23/2020  ? ? ?Past medical and surgical history were reviewed and updated in EPIC. ? ?Current Meds  ?Medication Sig  ? amLODipine (NORVASC) 10 MG tablet Take 10 mg by mouth daily.  ? atorvastatin (LIPITOR) 40 MG tablet Take 1 tablet (40 mg total) by mouth daily.  ? carvedilol (COREG) 6.25 MG tablet Take 6.25 mg by mouth 2 (two) times daily with a meal.  ? donepezil (ARICEPT) 5 MG tablet Take 5 mg by mouth at bedtime.  ? lisinopril (PRINIVIL,ZESTRIL) 40 MG tablet Take 40 mg by mouth daily.  ? pantoprazole (PROTONIX) 40 MG tablet Take 40 mg by mouth daily.  ? spironolactone (ALDACTONE) 25 MG tablet TAKE 1 TABLET BY MOUTH EVERY DAY  ? vitamin B-12 (CYANOCOBALAMIN) 1000 MCG tablet Take 1,000 mcg by mouth daily.  ? ? ?Allergies: Patient has no known allergies. ? ?Social History  ? ?Tobacco Use  ? Smoking status: Former  ?  Packs/day: 0.50  ?  Years: 25.00  ?  Pack years: 12.50  ?  Types: Cigarettes  ?  Quit date: 05/2017  ?  Years since quitting: 4.0  ? Smokeless tobacco: Never  ?Vaping Use  ? Vaping Use: Never used  ?Substance Use Topics  ? Alcohol use: Not Currently  ?  Comment: no drink since march  ? Drug use: No  ? ? ?Family History  ?Problem Relation Age of Onset  ? Breast cancer Sister   ? Heart  attack Mother 70  ? ? ?Review of Systems: ?A 12-system review of systems was performed and was negative except as noted in the HPI. ? ?-------------------------------------------------------------------------------------------------- ? ?Physical Exam: ?BP (!) 104/50 (BP Location: Left Arm, Patient Position: Sitting, Cuff Size: Normal)   Pulse (!) 54   Ht '5\' 4"'$  (1.626 m)   Wt 171 lb (77.6 kg)   SpO2 98%   BMI 29.35 kg/m?  ? ?General:  NAD. ?Neck: No JVD or HJR. ?Lungs: Clear to auscultation bilaterally without wheezes or crackles. ?Heart: Regular rate and rhythm.  2/6 systolic murmur at the upper sternal borders and along the left sternal border.  No rubs or gallops.   ?Abdomen: Soft, nontender, nondistended. ?Extremities: No lower extremity edema. ? ?EKG: Sinus bradycardia, 54, left atrial enlargement, question prior inferior anteroseptal infarcts with lateral T wave inversion.  No acute changes-personally reviewed ? ?Lab Results  ?Component Value Date  ? WBC 4.9 05/02/2021  ? HGB 11.7 (L) 05/02/2021  ? HCT 36.0 05/02/2021  ? MCV 93.8 05/02/2021  ? PLT 249 05/02/2021  ? ?Labs dated December 27, 2020 from care everywhere ? ?Sodium 144, potassium 4.2, chloride 113, CO2 24.9, BUN 20, creatinine 1.0, glucose 99 ?Calcium 8.8, total protein 6.0, albumin 3.8 ?Total bilirubin 0.4, alkaline phosphatase 77, AST 12, ALT 13 ?Hemoglobin A1c 5.4 ?Total cholesterol 156, triglycerides 60, HDL 48.3, LDL 96 ?-------------------------------------------------------------------------------------------------- ? ?ASSESSMENT AND PLAN: ? ?1.  Essential hypertension: Blood pressures well controlled today at 104/50.  She remains on amlodipine, lisinopril, and spironolactone with stable lab work in October. ? ?2.  Hyperlipidemia: LDL improved to 96 after switching from pravastatin to atorvastatin 40 mg daily.  Normal LFTs in October. ? ?3.  Iron deficiency anemia: Stable H&H of 11.7 and 36 by labs in February. ? ?4.  History of chest pain:  Nonischemic Myoview in October 2020. ? ?5.  Left ventricular hypertrophy: Cardiac MRI April 2022 with moderate, asymmetric LVH with basal septal hypertrophy up to 1.5 cm, and normal LV function.  No delayed hyperenhancement.  Blood pressure well controlled today.  She denies chest pain, dyspnea, presyncope, or syncope. ? ?6.  Disposition: Follow-up in clinic in 6 months or sooner if necessary. ? ?[name], NP  ?05/30/2021 ?3:43 PM ? ?

## 2021-07-22 ENCOUNTER — Other Ambulatory Visit: Payer: Self-pay | Admitting: Internal Medicine

## 2021-08-01 ENCOUNTER — Inpatient Hospital Stay: Payer: Medicare Other | Attending: Oncology

## 2021-09-02 DIAGNOSIS — R7303 Prediabetes: Secondary | ICD-10-CM | POA: Insufficient documentation

## 2021-10-19 ENCOUNTER — Other Ambulatory Visit: Payer: Self-pay | Admitting: Internal Medicine

## 2021-10-30 ENCOUNTER — Inpatient Hospital Stay: Payer: Medicare Other

## 2021-11-01 ENCOUNTER — Other Ambulatory Visit: Payer: Self-pay

## 2021-11-01 ENCOUNTER — Inpatient Hospital Stay: Payer: Medicare Other | Attending: Oncology

## 2021-11-01 DIAGNOSIS — Z79899 Other long term (current) drug therapy: Secondary | ICD-10-CM | POA: Diagnosis not present

## 2021-11-01 DIAGNOSIS — D509 Iron deficiency anemia, unspecified: Secondary | ICD-10-CM | POA: Diagnosis present

## 2021-11-01 DIAGNOSIS — E785 Hyperlipidemia, unspecified: Secondary | ICD-10-CM | POA: Insufficient documentation

## 2021-11-01 DIAGNOSIS — Z87891 Personal history of nicotine dependence: Secondary | ICD-10-CM | POA: Insufficient documentation

## 2021-11-01 DIAGNOSIS — I10 Essential (primary) hypertension: Secondary | ICD-10-CM | POA: Insufficient documentation

## 2021-11-01 LAB — CBC WITH DIFFERENTIAL/PLATELET
Abs Immature Granulocytes: 0.01 10*3/uL (ref 0.00–0.07)
Basophils Absolute: 0 10*3/uL (ref 0.0–0.1)
Basophils Relative: 1 %
Eosinophils Absolute: 0.2 10*3/uL (ref 0.0–0.5)
Eosinophils Relative: 4 %
HCT: 33 % — ABNORMAL LOW (ref 36.0–46.0)
Hemoglobin: 10.9 g/dL — ABNORMAL LOW (ref 12.0–15.0)
Immature Granulocytes: 0 %
Lymphocytes Relative: 34 %
Lymphs Abs: 1.5 10*3/uL (ref 0.7–4.0)
MCH: 30.4 pg (ref 26.0–34.0)
MCHC: 33 g/dL (ref 30.0–36.0)
MCV: 92.2 fL (ref 80.0–100.0)
Monocytes Absolute: 0.4 10*3/uL (ref 0.1–1.0)
Monocytes Relative: 9 %
Neutro Abs: 2.3 10*3/uL (ref 1.7–7.7)
Neutrophils Relative %: 52 %
Platelets: 224 10*3/uL (ref 150–400)
RBC: 3.58 MIL/uL — ABNORMAL LOW (ref 3.87–5.11)
RDW: 14.6 % (ref 11.5–15.5)
WBC: 4.4 10*3/uL (ref 4.0–10.5)
nRBC: 0 % (ref 0.0–0.2)

## 2021-11-01 LAB — IRON AND TIBC
Iron: 73 ug/dL (ref 28–170)
Saturation Ratios: 29 % (ref 10.4–31.8)
TIBC: 256 ug/dL (ref 250–450)
UIBC: 183 ug/dL

## 2021-11-01 LAB — FERRITIN: Ferritin: 92 ng/mL (ref 11–307)

## 2021-11-11 ENCOUNTER — Encounter: Payer: Self-pay | Admitting: Oncology

## 2021-11-11 ENCOUNTER — Inpatient Hospital Stay (HOSPITAL_BASED_OUTPATIENT_CLINIC_OR_DEPARTMENT_OTHER): Payer: Medicare Other | Admitting: Oncology

## 2021-11-11 ENCOUNTER — Inpatient Hospital Stay: Payer: Medicare Other

## 2021-11-11 VITALS — BP 115/50 | HR 52 | Temp 98.4°F | Resp 18 | Wt 162.9 lb

## 2021-11-11 DIAGNOSIS — D649 Anemia, unspecified: Secondary | ICD-10-CM

## 2021-11-11 DIAGNOSIS — D509 Iron deficiency anemia, unspecified: Secondary | ICD-10-CM | POA: Diagnosis not present

## 2021-11-11 LAB — CBC WITH DIFFERENTIAL/PLATELET
Abs Immature Granulocytes: 0.02 10*3/uL (ref 0.00–0.07)
Basophils Absolute: 0 10*3/uL (ref 0.0–0.1)
Basophils Relative: 1 %
Eosinophils Absolute: 0.1 10*3/uL (ref 0.0–0.5)
Eosinophils Relative: 3 %
HCT: 35.8 % — ABNORMAL LOW (ref 36.0–46.0)
Hemoglobin: 11.4 g/dL — ABNORMAL LOW (ref 12.0–15.0)
Immature Granulocytes: 1 %
Lymphocytes Relative: 31 %
Lymphs Abs: 1.4 10*3/uL (ref 0.7–4.0)
MCH: 29.9 pg (ref 26.0–34.0)
MCHC: 31.8 g/dL (ref 30.0–36.0)
MCV: 94 fL (ref 80.0–100.0)
Monocytes Absolute: 0.4 10*3/uL (ref 0.1–1.0)
Monocytes Relative: 9 %
Neutro Abs: 2.5 10*3/uL (ref 1.7–7.7)
Neutrophils Relative %: 55 %
Platelets: 221 10*3/uL (ref 150–400)
RBC: 3.81 MIL/uL — ABNORMAL LOW (ref 3.87–5.11)
RDW: 15 % (ref 11.5–15.5)
WBC: 4.4 10*3/uL (ref 4.0–10.5)
nRBC: 0 % (ref 0.0–0.2)

## 2021-11-11 LAB — IRON AND TIBC
Iron: 65 ug/dL (ref 28–170)
Saturation Ratios: 25 % (ref 10.4–31.8)
TIBC: 265 ug/dL (ref 250–450)
UIBC: 200 ug/dL

## 2021-11-11 LAB — VITAMIN B12: Vitamin B-12: 1157 pg/mL — ABNORMAL HIGH (ref 180–914)

## 2021-11-11 LAB — FOLATE: Folate: 12.5 ng/mL (ref >5.9)

## 2021-11-11 LAB — RETICULOCYTES
Immature Retic Fract: 9.4 % (ref 2.3–15.9)
RBC.: 3.71 MIL/uL — ABNORMAL LOW (ref 3.87–5.11)
Retic Count, Absolute: 72 10*3/uL (ref 19.0–186.0)
Retic Ct Pct: 1.9 % (ref 0.4–3.1)

## 2021-11-11 LAB — LACTATE DEHYDROGENASE: LDH: 139 U/L (ref 98–192)

## 2021-11-11 LAB — FERRITIN: Ferritin: 98 ng/mL (ref 11–307)

## 2021-11-11 NOTE — Progress Notes (Signed)
Pt states that her legs at times causes pain and will like to discuss blood thinners.

## 2021-11-11 NOTE — Progress Notes (Signed)
Hematology/Oncology Consult note [facility]  Telephone:([phone] [phone]  Patient Care Team: Derinda Late, MD as PCP - General (Family Medicine) End, Harrell Gave, MD as PCP - Cardiology (Cardiology)   Name of the patient: Natalie Knox  [id number]  March 11, 1951   Date of visit: 11/11/21  Diagnosis-iron deficiency anemia  Chief complaint/ Reason for visit-routine follow-up of iron deficiency anemia  Heme/Onc history: patient is a 71 year old African-American female With a past medical history significant for hypertension hyperlipidemia was recently admitted to the hospital for acute blood loss anemia.  She had EGD and colonoscopy showed erythema in the antrum of the stomach but did not reveal any cause of bleeding.  She has not yet completed her capsule study.  She was found to have a hemoglobin of 6.5 on admission with iron studies indicative of iron deficiency.  Patient was given 1 unit of blood transfusion and 1 dose of Venofer.  Her hemoglobin on discharge was 8.6.  Patient received 2 doses of Feraheme in November 2021. Hemoglobin since then has remained stable around 11.  Interval history-patient reports no changes in her baseline health.  She has mild chronic fatigue.  Denies any new complaints at this time  ECOG PS- 1 Pain scale- 0  Review of systems- Review of Systems  Constitutional:  Positive for malaise/fatigue. Negative for chills, fever and weight loss.  HENT:  Negative for congestion, ear discharge and nosebleeds.   Eyes:  Negative for blurred vision.  Respiratory:  Negative for cough, hemoptysis, sputum production, shortness of breath and wheezing.   Cardiovascular:  Negative for chest pain, palpitations, orthopnea and claudication.  Gastrointestinal:  Negative for abdominal pain, blood in stool, constipation, diarrhea, heartburn, melena, nausea and vomiting.  Genitourinary:  Negative for dysuria, flank pain, frequency, hematuria  and urgency.  Musculoskeletal:  Negative for back pain, joint pain and myalgias.  Skin:  Negative for rash.  Neurological:  Negative for dizziness, tingling, focal weakness, seizures, weakness and headaches.  Endo/Heme/Allergies:  Does not bruise/bleed easily.  Psychiatric/Behavioral:  Negative for depression and suicidal ideas. The patient does not have insomnia.       No Known Allergies   Past Medical History:  Diagnosis Date   Allergic rhinitis    Anemia    Atypical chest pain    a. 05/2009 Ex MV: EF 67%, no ischemia/infarct; b. 12/2018 MV: No ischemia/infarct. Cor Ca2+ and Aortic atherosclerosis noted. EF 55-65%.   Carotid arterial disease ([place])    a. 10/2019 Carotid U/S: 1-39% bilat ICA stenosis w/ <50% RECA and CCA stenoses.   Colon polyp    a. 11/2018 Colonscopy: 89m polyp removed. Otw nl study.   Hard of hearing    has hearing aid   Hyperlipidemia    Hypertension    Iron deficiency anemia    a. 11/2018 s/p 1u prbcs-->EGD w/ erythema in antrum of stomach but otw nl. Colonoscopy notable for 665mpolyp.   LVH (left ventricular hypertrophy)    a. 11/2018 Echo: EF 60-65%, mod to sev LVH w/ near cavity obliteration in systole. Diast dysfxn. Nl RV size/fxn. Mildly elev PASP.   Neurosyphilis      Past Surgical History:  Procedure Laterality Date   COLONOSCOPY     COLONOSCOPY N/A 12/22/2019   Procedure: COLONOSCOPY;  Surgeon: TaVirgel ManifoldMD;  Location: [facility] ENDOSCOPY;  Service: Endoscopy;  Laterality: N/A;   COLONOSCOPY WITH PROPOFOL N/A 04/19/2021   Procedure: COLONOSCOPY WITH PROPOFOL;  Surgeon: VaLin LandsmanMD;  Location: [facility] ENDOSCOPY;  Service: Gastroenterology;  Laterality: N/A;   ESOPHAGOGASTRODUODENOSCOPY N/A 12/21/2019   Procedure: ESOPHAGOGASTRODUODENOSCOPY (EGD);  Surgeon: Virgel Manifold, MD;  Location: [facility] ENDOSCOPY;  Service: Endoscopy;  Laterality: N/A;   GIVENS CAPSULE STUDY N/A 12/22/2019   Procedure: GIVENS CAPSULE STUDY;  Surgeon:  Virgel Manifold, MD;  Location: [facility] ENDOSCOPY;  Service: Endoscopy;  Laterality: N/A;  If colon finds nothing, needs a capsule endoscopy   GIVENS CAPSULE STUDY N/A 02/01/2020   Procedure: GIVENS CAPSULE STUDY;  Surgeon: Virgel Manifold, MD;  Location: [facility] ENDOSCOPY;  Service: Endoscopy;  Laterality: N/A;   UPPER GI ENDOSCOPY      Social History   Socioeconomic History   Marital status: Single    Spouse name: Not on file   Number of children: Not on file   Years of education: Not on file   Highest education level: Not on file  Occupational History   Not on file  Tobacco Use   Smoking status: Former    Packs/day: 0.50    Years: 25.00    Total pack years: 12.50    Types: Cigarettes    Quit date: 05/2017    Years since quitting: 4.4   Smokeless tobacco: Never  Vaping Use   Vaping Use: Never used  Substance and Sexual Activity   Alcohol use: Not Currently    Comment: no drink since march   Drug use: No   Sexual activity: Not on file  Other Topics Concern   Not on file  Social History Narrative   Not on file   Social Determinants of Health   Financial Resource Strain: Not on file  Food Insecurity: No Food Insecurity (06/05/2020)   Hunger Vital Sign    Worried About Running Out of Food in the Last Year: Never true    Ran Out of Food in the Last Year: Never true  Transportation Needs: Unmet Transportation Needs (06/05/2020)   PRAPARE - Hydrologist (Medical): Yes    Lack of Transportation (Non-Medical): Yes  Physical Activity: Not on file  Stress: Not on file  Social Connections: Not on file  Intimate Partner Violence: Not on file    Family History  Problem Relation Age of Onset   Breast cancer Sister    Heart attack Mother 82     Current Outpatient Medications:    amLODipine (NORVASC) 10 MG tablet, Take 1 tablet by mouth daily., Disp: , Rfl:    atorvastatin (LIPITOR) 40 MG tablet, Take 1 tablet (40 mg total) by mouth  daily., Disp: 90 tablet, Rfl: 3   carvedilol (COREG) 6.25 MG tablet, Take 6.25 mg by mouth 2 (two) times daily with a meal., Disp: , Rfl:    lisinopril (PRINIVIL,ZESTRIL) 40 MG tablet, Take 40 mg by mouth daily., Disp: , Rfl:    pantoprazole (PROTONIX) 40 MG tablet, Take 40 mg by mouth daily., Disp: , Rfl:    spironolactone (ALDACTONE) 25 MG tablet, TAKE 1 TABLET BY MOUTH EVERY DAY, Disp: 90 tablet, Rfl: 0   vitamin B-12 (CYANOCOBALAMIN) 1000 MCG tablet, Take 1,000 mcg by mouth daily., Disp: , Rfl:    donepezil (ARICEPT) 5 MG tablet, Take 5 mg by mouth at bedtime. (Patient not taking: Reported on 11/11/2021), Disp: , Rfl:   Physical exam:  Vitals:   11/11/21 1300  BP: (!) 115/50  Pulse: (!) 52  Resp: 18  Temp: 98.4 F (36.9 C)  SpO2: 100%  Weight: 162 lb 14.4 oz (73.9  kg)   Physical Exam Cardiovascular:     Rate and Rhythm: Normal rate and regular rhythm.     Heart sounds: Normal heart sounds.  Pulmonary:     Effort: Pulmonary effort is normal.     Breath sounds: Normal breath sounds.  Abdominal:     General: Bowel sounds are normal.     Palpations: Abdomen is soft.  Skin:    General: Skin is warm and dry.  Neurological:     Mental Status: She is alert and oriented to person, place, and time.         Latest Ref Rng & Units 05/23/2020   10:15 AM  CMP  Glucose 65 - 99 mg/dL 79   BUN 8 - 27 mg/dL 19   Creatinine 0.57 - 1.00 mg/dL 1.02   Sodium 134 - 144 mmol/L 143   Potassium 3.5 - 5.2 mmol/L 4.7   Chloride 96 - 106 mmol/L 109   CO2 20 - 29 mmol/L 17   Calcium 8.7 - 10.3 mg/dL 9.2       Latest Ref Rng & Units 11/11/2021    1:55 PM  CBC  WBC 4.0 - 10.5 K/uL 4.4   Hemoglobin 12.0 - 15.0 g/dL 11.4   Hematocrit 36.0 - 46.0 % 35.8   Platelets 150 - 400 K/uL 221      Assessment and plan- Patient is a 71 y.o. female here for routine follow-up of iron deficiency anemia   Patient hemoglobin typically ranges between 11-12.  However labs that were done on 11/01/2021 showed  that her hemoglobin had drifted down to 10.9.  Iron studies are presently not indicative of iron deficiency.  I am repeating her CBC along with additional anemia work-up including B12 folate reticulocyte count LDH haptoglobin myeloma panel and serum free light chains at this time.  If peripheral blood anemia work-up is unremarkable I will continue to monitor her CBC in 3 in 6 months and I will see her back in 6 months   Visit Diagnosis 1. Normocytic anemia      Dr. Randa Evens, MD, MPH [facility] [phone] 11/11/2021 3:19 PM

## 2021-11-12 LAB — HAPTOGLOBIN: Haptoglobin: 143 mg/dL (ref 42–346)

## 2021-11-12 LAB — KAPPA/LAMBDA LIGHT CHAINS
Kappa free light chain: 28.2 mg/L — ABNORMAL HIGH (ref 3.3–19.4)
Kappa, lambda light chain ratio: 1.66 — ABNORMAL HIGH (ref 0.26–1.65)
Lambda free light chains: 17 mg/L (ref 5.7–26.3)

## 2021-11-14 ENCOUNTER — Other Ambulatory Visit: Payer: Self-pay | Admitting: Internal Medicine

## 2021-11-18 LAB — MULTIPLE MYELOMA PANEL, SERUM
Albumin SerPl Elph-Mcnc: 3.6 g/dL (ref 2.9–4.4)
Albumin/Glob SerPl: 1.6 (ref 0.7–1.7)
Alpha 1: 0.1 g/dL (ref 0.0–0.4)
Alpha2 Glob SerPl Elph-Mcnc: 0.5 g/dL (ref 0.4–1.0)
B-Globulin SerPl Elph-Mcnc: 0.8 g/dL (ref 0.7–1.3)
Gamma Glob SerPl Elph-Mcnc: 0.9 g/dL (ref 0.4–1.8)
Globulin, Total: 2.3 g/dL (ref 2.2–3.9)
IgA: 210 mg/dL (ref 64–422)
IgG (Immunoglobin G), Serum: 1081 mg/dL (ref 586–1602)
IgM (Immunoglobulin M), Srm: 49 mg/dL (ref 26–217)
Total Protein ELP: 5.9 g/dL — ABNORMAL LOW (ref 6.0–8.5)

## 2021-12-04 ENCOUNTER — Ambulatory Visit: Payer: Medicare Other | Attending: Internal Medicine | Admitting: Internal Medicine

## 2021-12-04 ENCOUNTER — Encounter: Payer: Self-pay | Admitting: Internal Medicine

## 2021-12-04 VITALS — BP 130/58 | HR 52 | Ht 64.0 in | Wt 159.0 lb

## 2021-12-04 DIAGNOSIS — I700 Atherosclerosis of aorta: Secondary | ICD-10-CM

## 2021-12-04 DIAGNOSIS — M79604 Pain in right leg: Secondary | ICD-10-CM | POA: Diagnosis not present

## 2021-12-04 DIAGNOSIS — I119 Hypertensive heart disease without heart failure: Secondary | ICD-10-CM | POA: Diagnosis not present

## 2021-12-04 DIAGNOSIS — R0989 Other specified symptoms and signs involving the circulatory and respiratory systems: Secondary | ICD-10-CM

## 2021-12-04 DIAGNOSIS — E785 Hyperlipidemia, unspecified: Secondary | ICD-10-CM

## 2021-12-04 DIAGNOSIS — M79605 Pain in left leg: Secondary | ICD-10-CM

## 2021-12-04 NOTE — Progress Notes (Signed)
Follow-up Outpatient Visit Date: 12/04/2021  Primary Care Provider: Derinda Late, MD [phone] [address] [place] [address]  Chief Complaint: Leg pain  HPI:  Natalie Knox is a 71 y.o. female with history of hypertension, left ventricular hypertrophy, hyperlipidemia, iron deficiency anemia, and neurosyphilis, who presents for follow-up of hypertensive heart disease.  She was last seen in our office in March by Ignacia Bayley, NP, at which time she felt fairly well.  She was exercising regularly.  Blood pressure was very well controlled.  No medication changes or additional testing were pursued.  Today, Ms. Beitz reports that she has been doing well other than a few episodes of sharp leg pain.  It happens mostly at night.  She describes it as a sharp pain that radiates up 1 leg and then travels down the other.  She notes some soreness when she walks.  She denies chest pain, shortness of breath, palpitations, lightheadedness, and edema.  Home blood pressure is typically in the 607P systolic.  --------------------------------------------------------------------------------------------------  Cardiovascular History & Procedures: Cardiovascular Problems: Heart murmur Pericardial effusion Coronary artery calcification   Risk Factors: Hypertension, prior tobacco use, and age greater than 53   Cath/PCI: None   CV Surgery: None   EP Procedures and Devices: None   Non-Invasive Evaluation(s): Cardiac MRI (07/16/2020): Moderate asymmetric LVH with basal septal hypertrophy measuring up to 1.5 cm.  Normal LVEF.  Normal RV size and function.  No delayed hyperenhancement.  No significant valvular abnormality. Pharmacologic MPI (12/28/2018): Normal study without ischemia or scar.  LVEF 55-65%.  Coronary artery calcification and aortic atherosclerosis noted. TTE (12/09/2018): Technically difficult study.  Normal LV size with moderate-severe LVH.  LVEF  60-65%.  Grade 1 diastolic dysfunction.  Normal live RV size and wall thickness.  Mild pulmonary hypertension.  Small pericardial effusion.  Recent CV Pertinent Labs: Lab Results  Component Value Date   CHOL 198 07/08/2018   HDL 75 07/08/2018   LDLCALC 110 (H) 07/08/2018   TRIG 66 07/08/2018   CHOLHDL 2.6 07/08/2018   K 4.7 05/23/2020   BUN 19 05/23/2020   CREATININE 1.02 (H) 05/23/2020    Past medical and surgical history were reviewed and updated in EPIC.  Current Meds  Medication Sig   amLODipine (NORVASC) 10 MG tablet Take 1 tablet by mouth daily.   atorvastatin (LIPITOR) 40 MG tablet TAKE 1 TABLET BY MOUTH EVERY DAY   carvedilol (COREG) 6.25 MG tablet Take 6.25 mg by mouth 2 (two) times daily with a meal.   donepezil (ARICEPT) 10 MG tablet Take 10 mg by mouth at bedtime.   lisinopril (PRINIVIL,ZESTRIL) 40 MG tablet Take 40 mg by mouth daily.   pantoprazole (PROTONIX) 40 MG tablet Take 40 mg by mouth daily.   spironolactone (ALDACTONE) 25 MG tablet TAKE 1 TABLET BY MOUTH EVERY DAY   vitamin B-12 (CYANOCOBALAMIN) 1000 MCG tablet Take 1,000 mcg by mouth daily.    Allergies: Patient has no known allergies.  Social History   Tobacco Use   Smoking status: Former    Packs/day: 0.50    Years: 25.00    Total pack years: 12.50    Types: Cigarettes    Quit date: 05/2017    Years since quitting: 4.5   Smokeless tobacco: Never  Vaping Use   Vaping Use: Never used  Substance Use Topics   Alcohol use: Not Currently    Comment: no drink since march   Drug use: No  Family History  Problem Relation Age of Onset   Breast cancer Sister    Heart attack Mother 109    Review of Systems: A 12-system review of systems was performed and was negative except as noted in the HPI.  --------------------------------------------------------------------------------------------------  Physical Exam: BP (!) 130/58 (BP Location: Left Arm, Patient Position: Sitting, Cuff Size: Large)    Pulse (!) 52   Ht '5\' 4"'$  (1.626 m)   Wt 159 lb (72.1 kg)   SpO2 98%   BMI 27.29 kg/m   General:  NAD. Neck: No JVD or HJR. Lungs: Clear to auscultation bilaterally without wheezes or crackles. Heart: Bradycardic but regular with 2/6 systolic murmur.  No rubs or gallops. Abdomen: Soft, nontender, nondistended. Extremities: No lower extremity edema.  Pedal pulses 1+ bilaterally.  EKG: Sinus bradycardia with low voltage, possible left atrial enlargement, poor R wave progression, and lateral T wave inversions.  No significant change from prior tracing on 05/30/2021.  Lab Results  Component Value Date   WBC 4.4 11/11/2021   HGB 11.4 (L) 11/11/2021   HCT 35.8 (L) 11/11/2021   MCV 94.0 11/11/2021   PLT 221 11/11/2021    Lab Results  Component Value Date   NA 143 05/23/2020   K 4.7 05/23/2020   CL 109 (H) 05/23/2020   CO2 17 (L) 05/23/2020   BUN 19 05/23/2020   CREATININE 1.02 (H) 05/23/2020   GLUCOSE 79 05/23/2020   ALT 19 12/02/2018    Lab Results  Component Value Date   CHOL 198 07/08/2018   HDL 75 07/08/2018   LDLCALC 110 (H) 07/08/2018   TRIG 66 07/08/2018   CHOLHDL 2.6 07/08/2018    --------------------------------------------------------------------------------------------------  ASSESSMENT AND PLAN: Hypertensive heart disease: Ms. Ahart appears euvolemic without evidence of heart failure.  Blood pressure is upper normal today but typically better controlled at home.  We will continue her current regimen of amlodipine, carvedilol, lisinopril, and spironolactone.  Leg pain and diminished pedal pulses: Pain is atypical for arterial insufficiency, though reduced pedal pulses are noted on examination today.  We have agreed to obtain ABIs.  Based on results, will need to consider addition of antiplatelet therapy.  Aortic atherosclerosis and hyperlipidemia: Continue atorvastatin in the setting of coronary artery calcification and aortic atherosclerosis.  LDL reasonable  at 76 on last check in 06/2021.  Readdress aspirin therapy after completion of ABIs.  Follow-up: Return to clinic in 6 months.  [name], MD 12/04/2021 4:03 PM

## 2021-12-04 NOTE — Patient Instructions (Signed)
Medication Instructions:   Your physician recommends that you continue on your current medications as directed. Please refer to the Current Medication list given to you today.  *If you need a refill on your cardiac medications before your next appointment, please call your pharmacy*    Testing/Procedures:  Your physician has requested that you have an ankle brachial index (ABI). During this test an ultrasound and blood pressure cuff are used to evaluate the arteries that supply the arms and legs with blood. Allow thirty minutes for this exam. There are no restrictions or special instructions.   Your physician has requested that you have a lower extremity arterial duplex. This test is an ultrasound of the arteries in the legs or arms. It looks at arterial blood flow in the legs and arms. Allow one hour for Lower and Upper Arterial scans. There are no restrictions or special instructions     Follow-Up: At [facility], you and your health needs are our priority.  As part of our continuing mission to provide you with exceptional heart care, we have created designated Provider Care Teams.  These Care Teams include your primary Cardiologist (physician) and Advanced Practice Providers (APPs -  Physician Assistants and Nurse Practitioners) who all work together to provide you with the care you need, when you need it.  We recommend signing up for the patient portal called "MyChart".  Sign up information is provided on this After Visit Summary.  MyChart is used to connect with patients for Virtual Visits (Telemedicine).  Patients are able to view lab/test results, encounter notes, upcoming appointments, etc.  Non-urgent messages can be sent to your provider as well.   To learn more about what you can do with MyChart, go to [email or web address].    Your next appointment:   6 month(s)  The format for your next appointment:   In Person  Provider:   You may see [name], MD or one  of the following Advanced Practice Providers on your designated Care Team:   [name], NP [name], PA-C Cadence Kathlen Mody, PA-C Gerrie Nordmann, NP       Important Information About Sugar

## 2021-12-05 ENCOUNTER — Encounter: Payer: Self-pay | Admitting: Internal Medicine

## 2021-12-05 DIAGNOSIS — I700 Atherosclerosis of aorta: Secondary | ICD-10-CM | POA: Insufficient documentation

## 2021-12-05 DIAGNOSIS — M79605 Pain in left leg: Secondary | ICD-10-CM | POA: Insufficient documentation

## 2021-12-05 DIAGNOSIS — I119 Hypertensive heart disease without heart failure: Secondary | ICD-10-CM | POA: Insufficient documentation

## 2022-01-10 ENCOUNTER — Ambulatory Visit: Payer: Medicare Other | Attending: Internal Medicine

## 2022-01-16 ENCOUNTER — Other Ambulatory Visit: Payer: Self-pay | Admitting: Internal Medicine

## 2022-02-10 ENCOUNTER — Other Ambulatory Visit: Payer: Self-pay | Admitting: Internal Medicine

## 2022-02-11 ENCOUNTER — Inpatient Hospital Stay: Payer: Medicare Other | Attending: Oncology

## 2022-04-02 ENCOUNTER — Encounter: Payer: Self-pay | Admitting: Oncology

## 2022-04-09 ENCOUNTER — Ambulatory Visit: Payer: 59 | Attending: Internal Medicine

## 2022-04-09 DIAGNOSIS — M79605 Pain in left leg: Secondary | ICD-10-CM | POA: Diagnosis not present

## 2022-04-09 DIAGNOSIS — M79604 Pain in right leg: Secondary | ICD-10-CM

## 2022-04-09 DIAGNOSIS — R0989 Other specified symptoms and signs involving the circulatory and respiratory systems: Secondary | ICD-10-CM

## 2022-04-13 LAB — VAS US LOWER EXT ART SEG MULTI (SEGMENTALS & LE RAYNAUDS)
Left ABI: 0.51
Right ABI: 0.58

## 2022-04-17 ENCOUNTER — Ambulatory Visit: Payer: 59 | Admitting: Cardiovascular Disease

## 2022-05-07 ENCOUNTER — Other Ambulatory Visit: Payer: Self-pay | Admitting: Internal Medicine

## 2022-05-13 ENCOUNTER — Encounter: Payer: Self-pay | Admitting: Oncology

## 2022-05-13 ENCOUNTER — Inpatient Hospital Stay: Payer: 59 | Attending: Nurse Practitioner

## 2022-05-13 ENCOUNTER — Inpatient Hospital Stay: Payer: 59 | Admitting: Nurse Practitioner

## 2022-05-28 ENCOUNTER — Encounter: Payer: Self-pay | Admitting: Oncology

## 2022-06-04 ENCOUNTER — Ambulatory Visit: Payer: Medicare Other | Admitting: Internal Medicine

## 2022-07-01 ENCOUNTER — Encounter: Payer: Self-pay | Admitting: Oncology

## 2022-07-23 ENCOUNTER — Encounter: Payer: Self-pay | Admitting: Oncology

## 2022-07-31 ENCOUNTER — Other Ambulatory Visit
Admission: RE | Admit: 2022-07-31 | Discharge: 2022-07-31 | Disposition: A | Discharge status: Home / Self Care | Payer: 59 | Source: Ambulatory Visit | Attending: Internal Medicine | Admitting: Internal Medicine

## 2022-07-31 ENCOUNTER — Encounter: Payer: Self-pay | Admitting: Internal Medicine

## 2022-07-31 ENCOUNTER — Ambulatory Visit: Payer: 59 | Attending: Internal Medicine | Admitting: Internal Medicine

## 2022-07-31 VITALS — BP 166/60 | HR 46 | Ht 64.0 in | Wt 164.0 lb

## 2022-07-31 DIAGNOSIS — E785 Hyperlipidemia, unspecified: Secondary | ICD-10-CM

## 2022-07-31 DIAGNOSIS — R072 Precordial pain: Secondary | ICD-10-CM | POA: Diagnosis not present

## 2022-07-31 DIAGNOSIS — Z79899 Other long term (current) drug therapy: Secondary | ICD-10-CM

## 2022-07-31 DIAGNOSIS — I739 Peripheral vascular disease, unspecified: Secondary | ICD-10-CM

## 2022-07-31 DIAGNOSIS — I10 Essential (primary) hypertension: Secondary | ICD-10-CM

## 2022-07-31 DIAGNOSIS — R001 Bradycardia, unspecified: Secondary | ICD-10-CM

## 2022-07-31 DIAGNOSIS — R9431 Abnormal electrocardiogram [ECG] [EKG]: Secondary | ICD-10-CM

## 2022-07-31 LAB — CBC
HCT: 37.8 % (ref 36.0–46.0)
Hemoglobin: 12.1 g/dL (ref 12.0–15.0)
MCH: 30.2 pg (ref 26.0–34.0)
MCHC: 32 g/dL (ref 30.0–36.0)
MCV: 94.3 fL (ref 80.0–100.0)
Platelets: 237 10*3/uL (ref 150–400)
RBC: 4.01 MIL/uL (ref 3.87–5.11)
RDW: 14.3 % (ref 11.5–15.5)
WBC: 5.7 10*3/uL (ref 4.0–10.5)
nRBC: 0 % (ref 0.0–0.2)

## 2022-07-31 LAB — COMPREHENSIVE METABOLIC PANEL
ALT: 17 U/L (ref 0–44)
AST: 20 U/L (ref 15–41)
Albumin: 3.6 g/dL (ref 3.5–5.0)
Alkaline Phosphatase: 76 U/L (ref 38–126)
Anion gap: 7 (ref 5–15)
BUN: 16 mg/dL (ref 8–23)
CO2: 24 mmol/L (ref 22–32)
Calcium: 8.8 mg/dL — ABNORMAL LOW (ref 8.9–10.3)
Chloride: 113 mmol/L — ABNORMAL HIGH (ref 98–111)
Creatinine, Ser: 1.04 mg/dL — ABNORMAL HIGH (ref 0.44–1.00)
GFR, Estimated: 57 mL/min — ABNORMAL LOW (ref >60)
Glucose, Bld: 86 mg/dL (ref 70–99)
Potassium: 3.8 mmol/L (ref 3.5–5.1)
Sodium: 144 mmol/L (ref 135–145)
Total Bilirubin: 0.6 mg/dL (ref 0.3–1.2)
Total Protein: 6.3 g/dL — ABNORMAL LOW (ref 6.5–8.1)

## 2022-07-31 LAB — LIPID PANEL
Cholesterol: 138 mg/dL (ref 0–200)
HDL: 62 mg/dL (ref >40)
LDL Cholesterol: 70 mg/dL (ref 0–99)
Total CHOL/HDL Ratio: 2.2 RATIO
Triglycerides: 31 mg/dL (ref <150)
VLDL: 6 mg/dL (ref 0–40)

## 2022-07-31 MED ORDER — ASPIRIN 81 MG PO TBEC: 81.0000 mg | DELAYED_RELEASE_TABLET | Freq: Every day | ORAL | 12 refills | Status: AC

## 2022-07-31 MED ORDER — AMLODIPINE BESYLATE 10 MG PO TABS: 10.0000 mg | ORAL_TABLET | Freq: Every day | ORAL | 0 refills | Status: AC

## 2022-07-31 MED ORDER — CARVEDILOL 3.125 MG PO TABS: 3.1250 mg | ORAL_TABLET | Freq: Two times a day (BID) | ORAL | 3 refills | Status: DC

## 2022-07-31 NOTE — Progress Notes (Signed)
Follow-up Outpatient Visit Date: 07/31/2022  Primary Care Provider: Kandyce Rud, MD [phone] [address] [facility] [place] [address]  Chief Complaint: Chest pain  HPI:  Ms. Herrero is a 72 y.o. female with history of hypertension, left ventricular hypertrophy, hyperlipidemia, iron deficiency anemia, and neurosyphilis, who presents for follow-up of hypertensive heart disease.  I last saw her in September, at which time she was feeling well other than sporadic episodes of sharp leg pain.  Due to decreased pedal pulses on exam, we agreed to check ABIs and lower extremity arterial duplex study, which showed moderate to severe bilateral lower extremity disease.  She was referred to see Dr. Kirke Corin and also follow-up with me, though these visits were not completed.  Today, Ms. Luker reports that she has experienced occasional left upper chest pain as well as sensation that her heart is beating more forcefully than normal.  Symptoms are most pronounced when she is lying in bed.  They are not clearly exertional, though she may sometimes have chest pain when she is doing certain activities.  She denies dyspnea and leg edema.  She also denies pain in her legs.  She does not have any wounds/sores on her legs.  --------------------------------------------------------------------------------------------------  Cardiovascular History & Procedures: Cardiovascular Problems: Heart murmur Pericardial effusion Coronary artery calcification   Risk Factors: Hypertension, prior tobacco use, and age greater than 57   Cath/PCI: None   CV Surgery: None   EP Procedures and Devices: None   Non-Invasive Evaluation(s): ABI/LAE's (04/09/2022): 50 to 74% right iliac stenosis.  30 to 49% right common femoral and 50 to 74% right SFA disease and total occlusion of the right popliteal artery.  Left common femoral artery stenosis and 75-99% deep femoral artery stenosis.   50-74% SFA lesion on the left.  ABI/TBI's 0.58/0.35 on the right and 0.51/0.28 on the left. Cardiac MRI (07/16/2020): Moderate asymmetric LVH with basal septal hypertrophy measuring up to 1.5 cm.  Normal LVEF.  Normal RV size and function.  No delayed hyperenhancement.  No significant valvular abnormality. Pharmacologic MPI (12/28/2018): Normal study without ischemia or scar.  LVEF 55-65%.  Coronary artery calcification and aortic atherosclerosis noted. TTE (12/09/2018): Technically difficult study.  Normal LV size with moderate-severe LVH.  LVEF 60-65%.  Grade 1 diastolic dysfunction.  Normal live RV size and wall thickness.  Mild pulmonary hypertension.  Small pericardial effusion.  Recent CV Pertinent Labs: Lab Results  Component Value Date   CHOL 198 07/08/2018   HDL 75 07/08/2018   LDLCALC 110 (H) 07/08/2018   TRIG 66 07/08/2018   CHOLHDL 2.6 07/08/2018   K 4.7 05/23/2020   BUN 19 05/23/2020   CREATININE 1.02 (H) 05/23/2020    Past medical and surgical history were reviewed and updated in EPIC.  Current Meds  Medication Sig   atorvastatin (LIPITOR) 40 MG tablet TAKE 1 TABLET BY MOUTH EVERY DAY   carvedilol (COREG) 6.25 MG tablet Take 6.25 mg by mouth 2 (two) times daily with a meal.   donepezil (ARICEPT) 10 MG tablet Take 10 mg by mouth at bedtime.   lisinopril (PRINIVIL,ZESTRIL) 40 MG tablet Take 40 mg by mouth daily.   pantoprazole (PROTONIX) 40 MG tablet Take 40 mg by mouth daily.   spironolactone (ALDACTONE) 25 MG tablet TAKE 1 TABLET BY MOUTH EVERY DAY   vitamin B-12 (CYANOCOBALAMIN) 1000 MCG tablet Take 1,000 mcg by mouth daily.    Allergies: Patient has no known allergies.  Social History  Tobacco Use   Smoking status: Former    Packs/day: 0.50    Years: 25.00    Additional pack years: 0.00    Total pack years: 12.50    Types: Cigarettes    Quit date: 05/2017    Years since quitting: 5.1   Smokeless tobacco: Never  Vaping Use   Vaping Use: Never used   Substance Use Topics   Alcohol use: Not Currently    Comment: no drink since march   Drug use: No    Family History  Problem Relation Age of Onset   Breast cancer Sister    Heart attack Mother 65    Review of Systems: A 12-system review of systems was performed and was negative except as noted in the HPI.  --------------------------------------------------------------------------------------------------  Physical Exam: BP (!) 166/60 (BP Location: Left Arm, Patient Position: Sitting, Cuff Size: Normal)   Pulse (!) 46   Ht 5\' 4"  (1.626 m)   Wt 164 lb (74.4 kg)   SpO2 97%   BMI 28.15 kg/m   General:  NAD. Neck: No JVD or HJR. Lungs: Clear to auscultation bilaterally without wheezes or crackles. Heart: Bradycardic but regular with 2/6 systolic murmur. Abdomen: Soft, nontender, nondistended. Extremities: No lower extremity edema.  Pedal pulses trace bilaterally.  EKG: Sinus bradycardia with low voltage, septal infarct, and lateral T wave inversions.  Compared to prior tracing from 12/04/2021, heart rate has decreased and lateral T wave inversions are more pronounced.  Lab Results  Component Value Date   WBC 4.4 11/11/2021   HGB 11.4 (L) 11/11/2021   HCT 35.8 (L) 11/11/2021   MCV 94.0 11/11/2021   PLT 221 11/11/2021    Lab Results  Component Value Date   NA 143 05/23/2020   K 4.7 05/23/2020   CL 109 (H) 05/23/2020   CO2 17 (L) 05/23/2020   BUN 19 05/23/2020   CREATININE 1.02 (H) 05/23/2020   GLUCOSE 79 05/23/2020   ALT 19 12/02/2018    Lab Results  Component Value Date   CHOL 198 07/08/2018   HDL 75 07/08/2018   LDLCALC 110 (H) 07/08/2018   TRIG 66 07/08/2018   CHOLHDL 2.6 07/08/2018    --------------------------------------------------------------------------------------------------  ASSESSMENT AND PLAN: Chest pain and abnormal EKG: Ms. Deturk reports atypical chest pain but has more pronounced lateral T wave inversions on EKG today.  Given her  multiple risk factors including extensive PAD, I have recommended that we proceed with a coronary CTA for further evaluation.  I have also recommended that she restart aspirin 81 mg daily.  Check CBC, CMP, and lipid panel.  PAD: No claudication reported despite significant bilateral lower extremity PAD uncovered on vascular studies earlier this year.  Continue atorvastatin 40 mg daily and restart aspirin 81 mg daily.  Plan to repeat ABIs and arterial Dopplers about a year after last study.  We will plan referral to Dr. Kirke Corin if she becomes symptomatic.  Hypertension: Blood pressure poorly controlled today.,  Though Ms. Steinbrink ran out of amlodipine a few days ago and has not taken any of her other medications yet today.  We will send in a new prescription for amlodipine.  Bradycardia: Significant sinus bradycardia noted today.  We will decrease carvedilol to 3.125 mg twice daily.  Hyperlipidemia: Continue atorvastatin 40 mg daily for now, though LDL was suboptimal at 84 in December.  We will recheck a lipid panel and CMP today with plans to escalate atorvastatin if LDL remains above 70.  Follow-up return to clinic  in 1 month.  Follow-up: Return to clinic  Yvonne Kendall, MD 07/31/2022 8:30 AM

## 2022-07-31 NOTE — Patient Instructions (Addendum)
Medication Instructions:  Your physician recommends the following medication changes.  START TAKING: Aspirin 81 mg by mouth daily  DECREASE: Carvedilol to 3.125 mg by mouth twice a day  *If you need a refill on your cardiac medications before your next appointment, please call your pharmacy*   Lab Work: Your provider would like for you to have following labs drawn: (CBC, CMP, Lipid).   Please go to the [facility] entrance and check in at the front desk.  You do not need an appointment.  They are open from 7am-6 pm.   If you have labs (blood work) drawn today and your tests are completely normal, you will receive your results only by: MyChart Message (if you have MyChart) OR A paper copy in the mail If you have any lab test that is abnormal or we need to change your treatment, we will call you to review the results.   Testing/Procedures: Cardiac CT Angiography (CTA), is a special type of CT scan that uses a computer to produce multi-dimensional views of major blood vessels throughout the body. In CT angiography, a contrast material is injected through an IV to help visualize the blood vessels  Please see instructions below  Follow-Up: At [facility], you and your health needs are our priority.  As part of our continuing mission to provide you with exceptional heart care, we have created designated Provider Care Teams.  These Care Teams include your primary Cardiologist (physician) and Advanced Practice Providers (APPs -  Physician Assistants and Nurse Practitioners) who all work together to provide you with the care you need, when you need it.  We recommend signing up for the patient portal called "MyChart".  Sign up information is provided on this After Visit Summary.  MyChart is used to connect with patients for Virtual Visits (Telemedicine).  Patients are able to view lab/test results, encounter notes, upcoming appointments, etc.  Non-urgent messages can be sent to  your provider as well.   To learn more about what you can do with MyChart, go to [email or web address].    Your next appointment:   1 month(s)  Provider:   You will see one of the following Advanced Practice Providers on your designated Care Team:   Nicolasa Ducking, NP Eula Listen, PA-C Cadence Fransico Michael, PA-C Charlsie Quest, NP    Your cardiac CT will be scheduled at one of the below locations:   [facility] [address] [place], [place] [address] [phone]  OR   [facility] [address] [place], [place] [address] [phone]  If scheduled at [facility], please arrive at the [facility] and Children's Entrance (Entrance C2) of [facility] 30 minutes prior to test start time. You can use the FREE valet parking offered at entrance C (encouraged to control the heart rate for the test)  Proceed to the [facility] Radiology Department (first floor) to check-in and test prep.  All radiology patients and guests should use entrance C2 at [facility], accessed from [facility], even though the hospital's physical address listed is [address]    If scheduled at [facility] or [facility], please arrive 15 mins early for check-in and test prep.   Please follow these instructions carefully (unless otherwise directed):  On the Night Before the Test: Be sure to Drink plenty of water. Do not consume any caffeinated/decaffeinated beverages or chocolate 12 hours prior  to your test. Do not take any antihistamines 12 hours prior to your test.  On the Day of the Test: Drink plenty of water until 1 hour prior to the test. Do not eat any food 1 hour prior to test. You may take your regular medications prior to the test.  Take Carvedilol 3.125 mg  two hours prior to test. FEMALES- please wear underwire-free bra if  available, avoid dresses & tight clothing       After the Test: Drink plenty of water. After receiving IV contrast, you may experience a mild flushed feeling. This is normal. On occasion, you may experience a mild rash up to 24 hours after the test. This is not dangerous. If this occurs, you can take Benadryl 25 mg and increase your fluid intake. If you experience trouble breathing, this can be serious. If it is severe call 911 IMMEDIATELY. If it is mild, please call our office. If you take any of these medications: Glipizide/Metformin, Avandament, Glucavance, please do not take 48 hours after completing test unless otherwise instructed.  We will call to schedule your test 2-4 weeks out understanding that some insurance companies will need an authorization prior to the service being performed.   For non-scheduling related questions, please contact the cardiac imaging nurse navigator should you have any questions/concerns: Rockwell Alexandria, Cardiac Imaging Nurse Navigator Larey Brick, Cardiac Imaging Nurse Navigator [facility] Direct Office Dial: [phone]   For scheduling needs, including cancellations and rescheduling, please call [place], [phone].

## 2022-08-01 ENCOUNTER — Other Ambulatory Visit: Payer: Self-pay | Admitting: Internal Medicine

## 2022-08-01 ENCOUNTER — Encounter: Payer: Self-pay | Admitting: Internal Medicine

## 2022-08-01 DIAGNOSIS — I739 Peripheral vascular disease, unspecified: Secondary | ICD-10-CM | POA: Insufficient documentation

## 2022-08-01 DIAGNOSIS — R072 Precordial pain: Secondary | ICD-10-CM | POA: Insufficient documentation

## 2022-08-01 DIAGNOSIS — R001 Bradycardia, unspecified: Secondary | ICD-10-CM | POA: Insufficient documentation

## 2022-08-26 ENCOUNTER — Encounter: Payer: Self-pay | Admitting: Oncology

## 2022-08-27 ENCOUNTER — Encounter: Payer: Self-pay | Admitting: Oncology

## 2022-08-28 ENCOUNTER — Ambulatory Visit
Admission: RE | Admit: 2022-08-28 | Discharge: 2022-08-28 | Disposition: A | Discharge status: Home / Self Care | Payer: Medicare (Managed Care) | Source: Ambulatory Visit | Attending: Internal Medicine | Admitting: Internal Medicine

## 2022-08-28 DIAGNOSIS — I2510 Atherosclerotic heart disease of native coronary artery without angina pectoris: Secondary | ICD-10-CM

## 2022-08-28 DIAGNOSIS — R072 Precordial pain: Secondary | ICD-10-CM | POA: Insufficient documentation

## 2022-08-28 MED ORDER — IOHEXOL 350 MG/ML SOLN
75.0000 mL | Freq: Once | INTRAVENOUS | Status: AC | PRN
  Administered 2022-08-28: 75 mL via INTRAVENOUS

## 2022-08-28 MED ORDER — NITROGLYCERIN 0.4 MG SL SUBL
0.8000 mg | SUBLINGUAL_TABLET | Freq: Once | SUBLINGUAL | Status: AC
  Administered 2022-08-28: 0.8 mg via SUBLINGUAL

## 2022-08-28 NOTE — Progress Notes (Signed)
Patient tolerated CT well. Drank water after. Vital signs stable encourage to drink water throughout day.Reasons explained and verbalized understanding. Ambulated steady gait.  

## 2022-09-03 ENCOUNTER — Ambulatory Visit: Payer: Medicare (Managed Care) | Attending: Nurse Practitioner | Admitting: Nurse Practitioner

## 2022-09-03 ENCOUNTER — Encounter: Payer: Self-pay | Admitting: Nurse Practitioner

## 2022-09-03 NOTE — Progress Notes (Deleted)
Office Visit    Patient Name: Natalie Knox Date of Encounter: 09/03/2022  Primary Care Provider:  Kandyce Rud, MD Primary Cardiologist:  Yvonne Kendall, MD  Chief Complaint    72 year old female with a history of hypertension, hyperlipidemia, iron deficiency anemia, and neurosyphilis, presents for hypertension follow-up.  Past Medical History    Past Medical History:  Diagnosis Date   Allergic rhinitis    Anemia    Atypical chest pain    a. 05/2009 Ex MV: EF 67%, no ischemia/infarct; b. 12/2018 MV: No ischemia/infarct. Cor Ca2+ and Aortic atherosclerosis noted. EF 55-65%.   Carotid arterial disease (HCC)    a. 10/2019 Carotid U/S: 1-39% bilat ICA stenosis w/ <50% RECA and CCA stenoses.   Colon polyp    a. 11/2018 Colonscopy: 6mm polyp removed. Otw nl study.   Hard of hearing    has hearing aid   Hyperlipidemia    Hypertension    Iron deficiency anemia    a. 11/2018 s/p 1u prbcs-->EGD w/ erythema in antrum of stomach but otw nl. Colonoscopy notable for 6mm polyp.   LVH (left ventricular hypertrophy)    a. 11/2018 Echo: EF 60-65%, mod to sev LVH w/ near cavity obliteration in systole. Diast dysfxn. Nl RV size/fxn. Mildly elev PASP; b. 06/2020 Cardiac MRI: moderate, asymmetric LVH with basal septal hypertrophy up to 1.5 cm, and normal LV function.  No LGE.   Neurosyphilis    PAD (peripheral artery disease) (HCC)    a. 03/2022 ABI's/Duplex: R 0.58, L 0.51. R iliac 50-74, RCFA 30-49, R SFA 50-74, LCFA 50-74, 75-99 in deep FA, R SFA 50-74.   Past Surgical History:  Procedure Laterality Date   COLONOSCOPY     COLONOSCOPY N/A 12/22/2019   Procedure: COLONOSCOPY;  Surgeon: Pasty Spillers, MD;  Location: [facility] ENDOSCOPY;  Service: Endoscopy;  Laterality: N/A;   COLONOSCOPY WITH PROPOFOL N/A 04/19/2021   Procedure: COLONOSCOPY WITH PROPOFOL;  Surgeon: Toney Reil, MD;  Location: [facility] ENDOSCOPY;  Service: Gastroenterology;  Laterality: N/A;   ESOPHAGOGASTRODUODENOSCOPY  N/A 12/21/2019   Procedure: ESOPHAGOGASTRODUODENOSCOPY (EGD);  Surgeon: Pasty Spillers, MD;  Location: [facility] ENDOSCOPY;  Service: Endoscopy;  Laterality: N/A;   GIVENS CAPSULE STUDY N/A 12/22/2019   Procedure: GIVENS CAPSULE STUDY;  Surgeon: Pasty Spillers, MD;  Location: [facility] ENDOSCOPY;  Service: Endoscopy;  Laterality: N/A;  If colon finds nothing, needs a capsule endoscopy   GIVENS CAPSULE STUDY N/A 02/01/2020   Procedure: GIVENS CAPSULE STUDY;  Surgeon: Pasty Spillers, MD;  Location: [facility] ENDOSCOPY;  Service: Endoscopy;  Laterality: N/A;   UPPER GI ENDOSCOPY      Allergies  No Known Allergies  History of Present Illness      72 y.o. y/o female with a history of hypertension, hyperlipidemia, iron deficiency anemia, and neurosyphilis.  She previously underwent stress testing in the setting of atypical chest pain in March 2011, and again in October 2020, both eyes without evidence of ischemia.  October 2020 scan did not show coronary calcification and aortic atherosclerosis with normal LV function.  Echocardiogram in September 2020 showed EF 60 to 65% with moderate to severe LVH with near cavity obliteration in systole.  Diastolic dysfunction was noted as well as a mildly elevated PASP.  Cardiac MRI in April 2022 showed moderate, asymmetric LVH with basal septal hypertrophy up to 1.5 cm, with normal LV function.  There was no delayed hyperenhancement.  In September 2023, she reported intermittent, sharp, leg pain, occurring mostly at  night.  She was noted to have diminished pedal pulses.  She subsequently underwent ABIs with lower extremity arterial duplex in January 2024, which showed moderate to severe bilateral lower extremity disease with ABI of 0.58 on the right, and 0.51 on the left.  She was scheduled to follow-up with Dr. Kirke Corin, but did not follow through.   At cardiology office follow-up on Jul 31, 2022, she reported intermittent atypical chest pain.  She does have lateral  T wave inversions on EKG, with recommendation to undergo coronary CT angiogram.  She was also referred back to Dr. Kirke Corin.  Coronary CTA was performed earlier this month showing a calcium score of 2273 (99th percentile), with greater than 70% proximal LAD and RCA disease, with moderate (50-65%) proximal circumflex disease.  Home Medications    Current Outpatient Medications  Medication Sig Dispense Refill   amLODipine (NORVASC) 10 MG tablet Take 1 tablet (10 mg total) by mouth daily. 30 tablet 0   aspirin EC 81 MG tablet Take 1 tablet (81 mg total) by mouth daily. Swallow whole. 30 tablet 12   atorvastatin (LIPITOR) 40 MG tablet TAKE 1 TABLET BY MOUTH EVERY DAY 90 tablet 0   carvedilol (COREG) 3.125 MG tablet Take 1 tablet (3.125 mg total) by mouth 2 (two) times daily with a meal. 180 tablet 3   donepezil (ARICEPT) 10 MG tablet Take 10 mg by mouth at bedtime.     lisinopril (PRINIVIL,ZESTRIL) 40 MG tablet Take 40 mg by mouth daily.     pantoprazole (PROTONIX) 40 MG tablet Take 40 mg by mouth daily.     spironolactone (ALDACTONE) 25 MG tablet TAKE 1 TABLET BY MOUTH EVERY DAY 90 tablet 3   vitamin B-12 (CYANOCOBALAMIN) 1000 MCG tablet Take 1,000 mcg by mouth daily.     No current facility-administered medications for this visit.     Review of Systems    ***.  All other systems reviewed and are otherwise negative except as noted above.    Physical Exam    VS:  There were no vitals taken for this visit. , BMI There is no height or weight on file to calculate BMI.     GEN: Well nourished, well developed, in no acute distress. HEENT: normal. Neck: Supple, no JVD, carotid bruits, or masses. Cardiac: RRR, no murmurs, rubs, or gallops. No clubbing, cyanosis, edema.  Radials 2+/PT 2+ and equal bilaterally.  Respiratory:  Respirations regular and unlabored, clear to auscultation bilaterally. GI: Soft, nontender, nondistended, BS + x 4. MS: no deformity or atrophy. Skin: warm and dry, no  rash. Neuro:  Strength and sensation are intact. Psych: Normal affect.  Accessory Clinical Findings    ECG personally reviewed by me today - *** - no acute changes.  Lab Results  Component Value Date   WBC 5.7 07/31/2022   HGB 12.1 07/31/2022   HCT 37.8 07/31/2022   MCV 94.3 07/31/2022   PLT 237 07/31/2022   Lab Results  Component Value Date   CREATININE 1.04 (H) 07/31/2022   BUN 16 07/31/2022   NA 144 07/31/2022   K 3.8 07/31/2022   CL 113 (H) 07/31/2022   CO2 24 07/31/2022   Lab Results  Component Value Date   ALT 17 07/31/2022   AST 20 07/31/2022   ALKPHOS 76 07/31/2022   BILITOT 0.6 07/31/2022   Lab Results  Component Value Date   CHOL 138 07/31/2022   HDL 62 07/31/2022   LDLCALC 70 07/31/2022   TRIG 31 07/31/2022  CHOLHDL 2.2 07/31/2022    Lab Results  Component Value Date   HGBA1C 5.3 12/02/2018    Assessment & Plan    1.  ***   Nicolasa Ducking, NP 09/03/2022, 2:30 PM

## 2022-09-04 ENCOUNTER — Encounter: Payer: Self-pay | Admitting: Nurse Practitioner

## 2022-12-14 ENCOUNTER — Inpatient Hospital Stay: Payer: 59

## 2022-12-14 ENCOUNTER — Other Ambulatory Visit: Payer: Self-pay

## 2022-12-14 ENCOUNTER — Encounter: Payer: Self-pay | Admitting: Oncology

## 2022-12-14 ENCOUNTER — Emergency Department: Payer: 59

## 2022-12-14 ENCOUNTER — Inpatient Hospital Stay
Admission: EM | Admit: 2022-12-14 | Discharge: 2022-12-17 | DRG: 312 | Disposition: A | Discharge status: Home / Self Care | Payer: 59 | Attending: Obstetrics and Gynecology | Admitting: Obstetrics and Gynecology

## 2022-12-14 DIAGNOSIS — Z8249 Family history of ischemic heart disease and other diseases of the circulatory system: Secondary | ICD-10-CM | POA: Diagnosis not present

## 2022-12-14 DIAGNOSIS — Z86010 Personal history of colonic polyps: Secondary | ICD-10-CM | POA: Diagnosis not present

## 2022-12-14 DIAGNOSIS — I6529 Occlusion and stenosis of unspecified carotid artery: Secondary | ICD-10-CM | POA: Insufficient documentation

## 2022-12-14 DIAGNOSIS — I129 Hypertensive chronic kidney disease with stage 1 through stage 4 chronic kidney disease, or unspecified chronic kidney disease: Secondary | ICD-10-CM | POA: Diagnosis present

## 2022-12-14 DIAGNOSIS — F10259 Alcohol dependence with alcohol-induced psychotic disorder, unspecified: Secondary | ICD-10-CM | POA: Diagnosis present

## 2022-12-14 DIAGNOSIS — I272 Pulmonary hypertension, unspecified: Secondary | ICD-10-CM | POA: Diagnosis present

## 2022-12-14 DIAGNOSIS — Z7982 Long term (current) use of aspirin: Secondary | ICD-10-CM | POA: Diagnosis not present

## 2022-12-14 DIAGNOSIS — I472 Ventricular tachycardia, unspecified: Secondary | ICD-10-CM | POA: Diagnosis present

## 2022-12-14 DIAGNOSIS — I517 Cardiomegaly: Secondary | ICD-10-CM | POA: Diagnosis not present

## 2022-12-14 DIAGNOSIS — F17200 Nicotine dependence, unspecified, uncomplicated: Secondary | ICD-10-CM | POA: Diagnosis present

## 2022-12-14 DIAGNOSIS — I739 Peripheral vascular disease, unspecified: Secondary | ICD-10-CM | POA: Diagnosis present

## 2022-12-14 DIAGNOSIS — A523 Neurosyphilis, unspecified: Secondary | ICD-10-CM | POA: Diagnosis present

## 2022-12-14 DIAGNOSIS — I25118 Atherosclerotic heart disease of native coronary artery with other forms of angina pectoris: Secondary | ICD-10-CM | POA: Diagnosis present

## 2022-12-14 DIAGNOSIS — K219 Gastro-esophageal reflux disease without esophagitis: Secondary | ICD-10-CM | POA: Diagnosis present

## 2022-12-14 DIAGNOSIS — I358 Other nonrheumatic aortic valve disorders: Secondary | ICD-10-CM | POA: Diagnosis present

## 2022-12-14 DIAGNOSIS — I700 Atherosclerosis of aorta: Secondary | ICD-10-CM | POA: Diagnosis present

## 2022-12-14 DIAGNOSIS — R55 Syncope and collapse: Principal | ICD-10-CM | POA: Diagnosis present

## 2022-12-14 DIAGNOSIS — I493 Ventricular premature depolarization: Secondary | ICD-10-CM | POA: Diagnosis present

## 2022-12-14 DIAGNOSIS — D509 Iron deficiency anemia, unspecified: Secondary | ICD-10-CM | POA: Diagnosis present

## 2022-12-14 DIAGNOSIS — I2510 Atherosclerotic heart disease of native coronary artery without angina pectoris: Secondary | ICD-10-CM | POA: Diagnosis not present

## 2022-12-14 DIAGNOSIS — Z79899 Other long term (current) drug therapy: Secondary | ICD-10-CM | POA: Diagnosis not present

## 2022-12-14 DIAGNOSIS — R4781 Slurred speech: Secondary | ICD-10-CM | POA: Diagnosis present

## 2022-12-14 DIAGNOSIS — I3139 Other pericardial effusion (noninflammatory): Secondary | ICD-10-CM | POA: Diagnosis present

## 2022-12-14 DIAGNOSIS — N1831 Chronic kidney disease, stage 3a: Secondary | ICD-10-CM | POA: Diagnosis present

## 2022-12-14 DIAGNOSIS — R001 Bradycardia, unspecified: Secondary | ICD-10-CM | POA: Diagnosis present

## 2022-12-14 DIAGNOSIS — E785 Hyperlipidemia, unspecified: Secondary | ICD-10-CM | POA: Insufficient documentation

## 2022-12-14 DIAGNOSIS — Z803 Family history of malignant neoplasm of breast: Secondary | ICD-10-CM

## 2022-12-14 DIAGNOSIS — G3184 Mild cognitive impairment, so stated: Secondary | ICD-10-CM | POA: Insufficient documentation

## 2022-12-14 DIAGNOSIS — R931 Abnormal findings on diagnostic imaging of heart and coronary circulation: Secondary | ICD-10-CM

## 2022-12-14 DIAGNOSIS — Z8619 Personal history of other infectious and parasitic diseases: Secondary | ICD-10-CM

## 2022-12-14 DIAGNOSIS — R569 Unspecified convulsions: Secondary | ICD-10-CM | POA: Diagnosis not present

## 2022-12-14 DIAGNOSIS — I10 Essential (primary) hypertension: Secondary | ICD-10-CM | POA: Diagnosis present

## 2022-12-14 DIAGNOSIS — R61 Generalized hyperhidrosis: Secondary | ICD-10-CM | POA: Diagnosis present

## 2022-12-14 DIAGNOSIS — R0789 Other chest pain: Secondary | ICD-10-CM | POA: Diagnosis not present

## 2022-12-14 LAB — BASIC METABOLIC PANEL
Anion gap: 12 (ref 5–15)
BUN: 19 mg/dL (ref 8–23)
CO2: 18 mmol/L — ABNORMAL LOW (ref 22–32)
Calcium: 8.9 mg/dL (ref 8.9–10.3)
Chloride: 111 mmol/L (ref 98–111)
Creatinine, Ser: 1.18 mg/dL — ABNORMAL HIGH (ref 0.44–1.00)
GFR, Estimated: 48 mL/min — ABNORMAL LOW (ref >60)
Glucose, Bld: 121 mg/dL — ABNORMAL HIGH (ref 70–99)
Potassium: 3.9 mmol/L (ref 3.5–5.1)
Sodium: 141 mmol/L (ref 135–145)

## 2022-12-14 LAB — CBC
HCT: 37.2 % (ref 36.0–46.0)
Hemoglobin: 11.9 g/dL — ABNORMAL LOW (ref 12.0–15.0)
MCH: 30.2 pg (ref 26.0–34.0)
MCHC: 32 g/dL (ref 30.0–36.0)
MCV: 94.4 fL (ref 80.0–100.0)
Platelets: 255 10*3/uL (ref 150–400)
RBC: 3.94 MIL/uL (ref 3.87–5.11)
RDW: 13.9 % (ref 11.5–15.5)
WBC: 6.1 10*3/uL (ref 4.0–10.5)
nRBC: 0 % (ref 0.0–0.2)

## 2022-12-14 LAB — TROPONIN I (HIGH SENSITIVITY)
Troponin I (High Sensitivity): 3 ng/L (ref <18)
Troponin I (High Sensitivity): 4 ng/L (ref <18)

## 2022-12-14 MED ORDER — PANTOPRAZOLE SODIUM 40 MG PO TBEC
40.0000 mg | DELAYED_RELEASE_TABLET | Freq: Every day | ORAL | Status: DC
  Administered 2022-12-15 – 2022-12-17 (×3): 40 mg via ORAL
  Filled 2022-12-14 (×3): qty 1

## 2022-12-14 MED ORDER — ACETAMINOPHEN 650 MG RE SUPP: 650.0000 mg | Freq: Four times a day (QID) | RECTAL | Status: DC | PRN

## 2022-12-14 MED ORDER — NICOTINE 14 MG/24HR TD PT24: 14.0000 mg | MEDICATED_PATCH | Freq: Every day | TRANSDERMAL | Status: DC | PRN

## 2022-12-14 MED ORDER — ATORVASTATIN CALCIUM 20 MG PO TABS
40.0000 mg | ORAL_TABLET | Freq: Every day | ORAL | Status: DC
  Administered 2022-12-15 – 2022-12-17 (×3): 40 mg via ORAL
  Filled 2022-12-14 (×3): qty 2

## 2022-12-14 MED ORDER — DONEPEZIL HCL 5 MG PO TABS
10.0000 mg | ORAL_TABLET | Freq: Every day | ORAL | Status: DC
  Administered 2022-12-14 – 2022-12-16 (×3): 10 mg via ORAL
  Filled 2022-12-14 (×4): qty 2

## 2022-12-14 MED ORDER — ONDANSETRON HCL 4 MG/2ML IJ SOLN: 4.0000 mg | Freq: Four times a day (QID) | INTRAMUSCULAR | Status: DC | PRN

## 2022-12-14 MED ORDER — AMLODIPINE BESYLATE 10 MG PO TABS
10.0000 mg | ORAL_TABLET | Freq: Every day | ORAL | Status: DC
  Administered 2022-12-15 – 2022-12-17 (×3): 10 mg via ORAL
  Filled 2022-12-14 (×3): qty 1

## 2022-12-14 MED ORDER — ASPIRIN 81 MG PO TBEC
81.0000 mg | DELAYED_RELEASE_TABLET | Freq: Every day | ORAL | Status: DC
  Administered 2022-12-14 – 2022-12-17 (×4): 81 mg via ORAL
  Filled 2022-12-14 (×4): qty 1

## 2022-12-14 MED ORDER — HYDRALAZINE HCL 20 MG/ML IJ SOLN: 5.0000 mg | Freq: Three times a day (TID) | INTRAMUSCULAR | Status: DC | PRN

## 2022-12-14 MED ORDER — ENOXAPARIN SODIUM 40 MG/0.4ML IJ SOSY
40.0000 mg | PREFILLED_SYRINGE | INTRAMUSCULAR | Status: DC
  Administered 2022-12-14 – 2022-12-16 (×3): 40 mg via SUBCUTANEOUS
  Filled 2022-12-14 (×3): qty 0.4

## 2022-12-14 MED ORDER — VITAMIN B-12 1000 MCG PO TABS
1000.0000 ug | ORAL_TABLET | Freq: Every day | ORAL | Status: DC
  Administered 2022-12-15 – 2022-12-17 (×3): 1000 ug via ORAL
  Filled 2022-12-14 (×3): qty 1

## 2022-12-14 MED ORDER — SPIRONOLACTONE 25 MG PO TABS
25.0000 mg | ORAL_TABLET | Freq: Every day | ORAL | Status: DC
  Administered 2022-12-15 – 2022-12-16 (×2): 25 mg via ORAL
  Filled 2022-12-14 (×2): qty 1

## 2022-12-14 MED ORDER — SODIUM CHLORIDE 0.9% FLUSH
3.0000 mL | Freq: Two times a day (BID) | INTRAVENOUS | Status: DC
  Administered 2022-12-14 – 2022-12-17 (×7): 3 mL via INTRAVENOUS

## 2022-12-14 MED ORDER — ACETAMINOPHEN 325 MG PO TABS: 650.0000 mg | ORAL_TABLET | Freq: Four times a day (QID) | ORAL | Status: DC | PRN

## 2022-12-14 MED ORDER — ONDANSETRON HCL 4 MG PO TABS: 4.0000 mg | ORAL_TABLET | Freq: Four times a day (QID) | ORAL | Status: DC | PRN

## 2022-12-14 MED ORDER — LISINOPRIL 20 MG PO TABS
40.0000 mg | ORAL_TABLET | Freq: Every day | ORAL | Status: DC
  Administered 2022-12-15 – 2022-12-17 (×3): 40 mg via ORAL
  Filled 2022-12-14 (×3): qty 2

## 2022-12-14 MED ORDER — SENNOSIDES-DOCUSATE SODIUM 8.6-50 MG PO TABS: 1.0000 | ORAL_TABLET | Freq: Every evening | ORAL | Status: DC | PRN

## 2022-12-14 NOTE — H&P (Addendum)
History and Physical   [name]:[id number] DOB: [birth year] DOA: 12/14/2022  PCP: Kandyce Rud, MD  Outpatient Specialists: Dr. Cristopher Peru, [facility] neurology Patient coming from: Home via EMS  I have personally briefly reviewed patient's old medical records in [facility] EMR.  Chief Concern: Syncope  HPI: Ms. Natalie Knox is a 72 year old female with history of hypertension, GERD, stage IIIa chronic kidney disease, hyperlipidemia, cardiomegaly, atherosclerosis, who presents to the emergency department for chief concerns of syncope.  Vitals in the ED showed temperature 97.5, respiration rate of 18, heart rate of 44, blood pressure 141/51, SpO2 98% on room air.  Serum sodium is 141, potassium 3.9, chloride 111, bicarb 18, BUN of 19, serum creatinine 1.18, eGFR 48, nonfasting blood glucose 121, WBC 6.1, hemoglobin 11.9, platelets of 255.  High sensitivity troponin was 3.  ED treatment: None ---------------------------------- At bedside, patient was able to tell me her name, her age, current location, and current calendar year.  She reports that she was sitting in her chair where her niece was doing her hair.  The next thing she knew there was EMS.  Her niece states that she called 911 because patient passed out while sitting on the chair.  Patient states she never fell to the floor and did not sustain head or neck injury.  Per her niece, she passed out for about 5 minutes.  She denies urinary or bowel incontinence.  At bedside, she denies chest pain, shortness of breath, dysuria, hematuria, diarrhea, nausea, vomiting, fever, double vision, unexpected weight changes.  She reports that she was having your symptoms like this and her cardiologist had decreased her home medication several months ago.  Patient states that she thinks that at this time she will need all of her medication for her heart rate to be discontinued.  Social history: Lives at home.  She denies  tobacco, EtOH, recreational drug use.  She states she is retired.  Patient reports that she is not currently sexually active.  She endorses that she was diagnosed with neurosyphilis more than 20 years ago and that she did complete her antibiotic therapy at the time.  ROS: Constitutional: no weight change, no fever ENT/Mouth: no sore throat, no rhinorrhea Eyes: no eye pain, no vision changes Cardiovascular: no chest pain, no dyspnea,  no edema, no palpitations Respiratory: no cough, no sputum, no wheezing Gastrointestinal: no nausea, no vomiting, no diarrhea, no constipation Genitourinary: no urinary incontinence, no dysuria, no hematuria Musculoskeletal: no arthralgias, no myalgias Skin: no skin lesions, no pruritus, Neuro: + weakness, no loss of consciousness, + syncope Psych: no anxiety, no depression, no decrease appetite Heme/Lymph: no bruising, no bleeding  ED Course: Discussed with emergency medicine provider, patient requiring hospitalization for chief concerns of syncope.  Assessment/Plan  Principal Problem:   Syncope Active Problems:   Alcohol dependence with alcohol-induced psychotic disorder with complication (HCC)   Essential hypertension   Tobacco dependence   History of neurosyphilis   Stage 3a chronic kidney disease (HCC)   Aortic atherosclerosis (HCC)   PAD (peripheral artery disease) (HCC)   Sinus bradycardia   Mild cognitive impairment   Hyperlipidemia   Assessment and Plan:  * Syncope Etiology workup in progress, differentials include symptomatic bradycardia Complete echo ordered Holding home Coreg 3.125 mg p.o. twice daily on admission AM team to consult cardiology as appropriate Telemetry cardiac, inpatient  Hyperlipidemia Home atorvastatin 40 mg daily resumed  Mild cognitive impairment Continue donepezil 10 mg nightly  Sinus bradycardia Query symptomatic  bradycardia, patient took her home Coreg in the morning already Holding home Coreg  medication on admission  Stage 3a chronic kidney disease (HCC) At baseline  History of neurosyphilis Patient reports she has completed treatment many years ago and she has not been sexually active since  Tobacco dependence As needed nicotine patch ordered  Essential hypertension Lisinopril 40 mg daily, amlodipine 10 mg daily, spironolactone 25 mg daily for resumed for 9/23 Hydralazine 5 mg IV every 8 hours as needed for SBP greater 165, 4 days ordered  Chart reviewed.   DVT prophylaxis: Enoxaparin Code Status: Full code Diet: Heart healthy Family Communication: No, she states her niece already knows she is in the hospital Disposition Plan: Pending clinical course Consults called: None at this time; AM team to consult cardiology pending complete echo Admission status: Telemetry cardiac, inpatient  Past Medical History:  Diagnosis Date   Allergic rhinitis    Anemia    Atypical chest pain    a. 05/2009 Ex MV: EF 67%, no ischemia/infarct; b. 12/2018 MV: No ischemia/infarct. Cor Ca2+ and Aortic atherosclerosis noted. EF 55-65%.   CAD (coronary artery disease)    a. 08/2022 Cor CTA: Ca2+ 2273 (99th %'ile), LAD  >70% prox, RCA > 70% prox, LCX 50-65% proximal.   Carotid arterial disease (HCC)    a. 10/2019 Carotid U/S: 1-39% bilat ICA stenosis w/ <50% RECA and CCA stenoses.   Colon polyp    a. 11/2018 Colonscopy: 6mm polyp removed. Otw nl study.   Hard of hearing    has hearing aid   Hyperlipidemia    Hypertension    Iron deficiency anemia    a. 11/2018 s/p 1u prbcs-->EGD w/ erythema in antrum of stomach but otw nl. Colonoscopy notable for 6mm polyp.   LVH (left ventricular hypertrophy)    a. 11/2018 Echo: EF 60-65%, mod to sev LVH w/ near cavity obliteration in systole. Diast dysfxn. Nl RV size/fxn. Mildly elev PASP; b. 06/2020 Cardiac MRI: moderate, asymmetric LVH with basal septal hypertrophy up to 1.5 cm, and normal LV function.  No LGE.   Neurosyphilis    PAD (peripheral artery  disease) (HCC)    a. 03/2022 ABI's/Duplex: R 0.58, L 0.51. R iliac 50-74, RCFA 30-49, R SFA 50-74, LCFA 50-74, 75-99 in deep FA, R SFA 50-74.   Past Surgical History:  Procedure Laterality Date   COLONOSCOPY     COLONOSCOPY N/A 12/22/2019   Procedure: COLONOSCOPY;  Surgeon: Pasty Spillers, MD;  Location: [facility] ENDOSCOPY;  Service: Endoscopy;  Laterality: N/A;   COLONOSCOPY WITH PROPOFOL N/A 04/19/2021   Procedure: COLONOSCOPY WITH PROPOFOL;  Surgeon: Toney Reil, MD;  Location: [facility] ENDOSCOPY;  Service: Gastroenterology;  Laterality: N/A;   ESOPHAGOGASTRODUODENOSCOPY N/A 12/21/2019   Procedure: ESOPHAGOGASTRODUODENOSCOPY (EGD);  Surgeon: Pasty Spillers, MD;  Location: [facility] ENDOSCOPY;  Service: Endoscopy;  Laterality: N/A;   GIVENS CAPSULE STUDY N/A 12/22/2019   Procedure: GIVENS CAPSULE STUDY;  Surgeon: Pasty Spillers, MD;  Location: [facility] ENDOSCOPY;  Service: Endoscopy;  Laterality: N/A;  If colon finds nothing, needs a capsule endoscopy   GIVENS CAPSULE STUDY N/A 02/01/2020   Procedure: GIVENS CAPSULE STUDY;  Surgeon: Pasty Spillers, MD;  Location: [facility] ENDOSCOPY;  Service: Endoscopy;  Laterality: N/A;   UPPER GI ENDOSCOPY     Social History:  reports that she quit smoking about 5 years ago. Her smoking use included cigarettes. She started smoking about 30 years ago. She has a 12.5 pack-year smoking history. She has never used smokeless  tobacco. She reports that she does not currently use alcohol. She reports that she does not use drugs.  No Known Allergies Family History  Problem Relation Age of Onset   Heart attack Mother 23   Breast cancer Sister    Hypertension Brother    Family history: Family history reviewed and not pertinent.  Prior to Admission medications   Medication Sig Start Date End Date Taking? Authorizing Provider  carvedilol (COREG) 6.25 MG tablet Take 6.25 mg by mouth 2 (two) times daily with a meal. 10/14/22  Yes [provider]   amLODipine (NORVASC) 10 MG tablet Take 1 tablet (10 mg total) by mouth daily. 07/31/22   End, Cristal Deer, MD  aspirin EC 81 MG tablet Take 1 tablet (81 mg total) by mouth daily. Swallow whole. 07/31/22   End, Cristal Deer, MD  atorvastatin (LIPITOR) 40 MG tablet TAKE 1 TABLET BY MOUTH EVERY DAY 08/01/22   End, Cristal Deer, MD  carvedilol (COREG) 3.125 MG tablet Take 1 tablet (3.125 mg total) by mouth 2 (two) times daily with a meal. 07/31/22   End, Cristal Deer, MD  donepezil (ARICEPT) 10 MG tablet Take 10 mg by mouth at bedtime.    [provider]  lisinopril (PRINIVIL,ZESTRIL) 40 MG tablet Take 40 mg by mouth daily.    [provider]  pantoprazole (PROTONIX) 40 MG tablet Take 40 mg by mouth daily. 10/21/19   [provider]  spironolactone (ALDACTONE) 25 MG tablet TAKE 1 TABLET BY MOUTH EVERY DAY 01/16/22   End, Cristal Deer, MD  vitamin B-12 (CYANOCOBALAMIN) 1000 MCG tablet Take 1,000 mcg by mouth daily.    [provider]   Physical Exam: Vitals:   12/14/22 1323 12/14/22 1345 12/14/22 1346  BP: (!) 149/50 (!) 141/51   Pulse: (!) 46 (!) 48   Resp: 18    Temp: (!) 97.5 F (36.4 C)    SpO2: 98% 98%   Weight:   72.6 kg  Height:   5\' 4"  (1.626 m)   Constitutional: appears age-appropriate, NAD, calm Eyes: PERRL, lids and conjunctivae normal ENMT: Mucous membranes are moist. Posterior pharynx clear of any exudate or lesions. Age-appropriate dentition. Hearing appropriate Neck: normal, supple, no masses, no thyromegaly Respiratory: clear to auscultation bilaterally, no wheezing, no crackles. Normal respiratory effort. No accessory muscle use.  Cardiovascular: Regular rate and rhythm, no murmurs / rubs / gallops. No extremity edema. 2+ pedal pulses. No carotid bruits.  Abdomen: no tenderness, no masses palpated, no hepatosplenomegaly. Bowel sounds positive.  Musculoskeletal: no clubbing / cyanosis. No joint deformity upper and lower extremities. Good ROM, no  contractures, no atrophy. Normal muscle tone.  Skin: no rashes, lesions, ulcers. No induration Neurologic: Sensation intact. Strength 5/5 in all 4.  Psychiatric: Normal judgment and insight. Alert and oriented x 3. Normal mood.   EKG: independently reviewed, showing sinus bradycardia with rate of 44, QTc 400  Chest x-ray on Admission: I personally reviewed and I agree with radiologist reading as below.  X-ray chest PA and lateral  Result Date: 12/14/2022 CLINICAL DATA:  106001 Syncope 106001. Episode of diaphoresis and slurred speech. EXAM: CHEST - 2 VIEW COMPARISON:  08/04/2018. FINDINGS: Bilateral lung fields are clear. Bilateral costophrenic angles are clear. Normal cardio-mediastinal silhouette. Aortic arch calcifications noted. No acute osseous abnormalities. The soft tissues are within normal limits. IMPRESSION: No active cardiopulmonary disease. Electronically Signed   By: Jules Schick M.D.   On: 12/14/2022 16:00   CT Head Wo Contrast  Result Date: 12/14/2022 CLINICAL DATA:  Head trauma, minor (Age >= 65y); Neck trauma (Age >= 65y). Syncope EXAM: CT HEAD WITHOUT CONTRAST CT CERVICAL SPINE WITHOUT CONTRAST TECHNIQUE: Multidetector CT imaging of the head and cervical spine was performed following the standard protocol without intravenous contrast. Multiplanar CT image reconstructions of the cervical spine were also generated. RADIATION DOSE REDUCTION: This exam was performed according to the departmental dose-optimization program which includes automated exposure control, adjustment of the mA and/or kV according to patient size and/or use of iterative reconstruction technique. COMPARISON:  07/28/2017 FINDINGS: CT HEAD FINDINGS Brain: No evidence of acute infarction, hemorrhage, hydrocephalus, extra-axial collection or mass lesion/mass effect. Vascular: Atherosclerotic calcifications involving the large vessels of the skull base. No unexpected hyperdense vessel. Skull: Normal. Negative for  fracture or focal lesion. Sinuses/Orbits: No acute finding. Other: None. CT CERVICAL SPINE FINDINGS Alignment: Facet joints are aligned without dislocation or traumatic listhesis. Dens and lateral masses are aligned. Reversal of the cervical lordosis. Skull base and vertebrae: No acute fracture. No primary bone lesion or focal pathologic process. Soft tissues and spinal canal: No prevertebral fluid or swelling. No visible canal hematoma. Disc levels:  Moderate multilevel cervical spondylosis. Upper chest: Included lung apices are clear. Other: Bilateral carotid atherosclerosis. IMPRESSION: 1. No acute intracranial abnormality. 2. No acute fracture or subluxation of the cervical spine. Electronically Signed   By: Duanne Guess D.O.   On: 12/14/2022 14:31   CT Cervical Spine Wo Contrast  Result Date: 12/14/2022 CLINICAL DATA:  Head trauma, minor (Age >= 65y); Neck trauma (Age >= 65y). Syncope EXAM: CT HEAD WITHOUT CONTRAST CT CERVICAL SPINE WITHOUT CONTRAST TECHNIQUE: Multidetector CT imaging of the head and cervical spine was performed following the standard protocol without intravenous contrast. Multiplanar CT image reconstructions of the cervical spine were also generated. RADIATION DOSE REDUCTION: This exam was performed according to the departmental dose-optimization program which includes automated exposure control, adjustment of the mA and/or kV according to patient size and/or use of iterative reconstruction technique. COMPARISON:  07/28/2017 FINDINGS: CT HEAD FINDINGS Brain: No evidence of acute infarction, hemorrhage, hydrocephalus, extra-axial collection or mass lesion/mass effect. Vascular: Atherosclerotic calcifications involving the large vessels of the skull base. No unexpected hyperdense vessel. Skull: Normal. Negative for fracture or focal lesion. Sinuses/Orbits: No acute finding. Other: None. CT CERVICAL SPINE FINDINGS Alignment: Facet joints are aligned without dislocation or traumatic  listhesis. Dens and lateral masses are aligned. Reversal of the cervical lordosis. Skull base and vertebrae: No acute fracture. No primary bone lesion or focal pathologic process. Soft tissues and spinal canal: No prevertebral fluid or swelling. No visible canal hematoma. Disc levels:  Moderate multilevel cervical spondylosis. Upper chest: Included lung apices are clear. Other: Bilateral carotid atherosclerosis. IMPRESSION: 1. No acute intracranial abnormality. 2. No acute fracture or subluxation of the cervical spine. Electronically Signed   By: Duanne Guess D.O.   On: 12/14/2022 14:31    Labs on Admission: I have personally reviewed following labs  CBC: Recent Labs  Lab 12/14/22 1346  WBC 6.1  HGB 11.9*  HCT 37.2  MCV 94.4  PLT 255   Basic Metabolic Panel: Recent Labs  Lab 12/14/22 1346  NA 141  K 3.9  CL 111  CO2 18*  GLUCOSE 121*  BUN 19  CREATININE 1.18*  CALCIUM 8.9   GFR: Estimated Creatinine Clearance: 40.9 mL/min (A) (by C-G formula based on SCr of 1.18 mg/dL (H)).  Urine analysis:    Component Value Date/Time   COLORURINE YELLOW (A) 12/02/2018 0910   APPEARANCEUR  HAZY (A) 12/02/2018 0910   APPEARANCEUR Clear 01/04/2018 1400   LABSPEC 1.021 12/02/2018 0910   PHURINE 5.0 12/02/2018 0910   GLUCOSEU NEGATIVE 12/02/2018 0910   HGBUR NEGATIVE 12/02/2018 0910   BILIRUBINUR NEGATIVE 12/02/2018 0910   BILIRUBINUR Negative 01/04/2018 1400   KETONESUR NEGATIVE 12/02/2018 0910   PROTEINUR NEGATIVE 12/02/2018 0910   NITRITE NEGATIVE 12/02/2018 0910   LEUKOCYTESUR MODERATE (A) 12/02/2018 0910   This document was prepared using Dragon Voice Recognition software and may include unintentional dictation errors.  Dr. Sedalia Muta [facility]  If 7PM-7AM, please contact overnight-coverage provider If 7AM-7PM, please contact day attending provider [email or web address]  12/14/2022, 5:32 PM

## 2022-12-14 NOTE — ED Triage Notes (Signed)
Pt comes with c/o syncopal. Pt states she was getting her hair done and got hot and must have passed out after. Pt states little dizziness.  Pt denies any cp or sob.

## 2022-12-14 NOTE — Assessment & Plan Note (Signed)
Etiology workup in progress, differentials include symptomatic bradycardia Complete echo ordered Holding home Coreg 3.125 mg p.o. twice daily on admission AM team to consult cardiology as appropriate Telemetry cardiac, inpatient

## 2022-12-14 NOTE — Assessment & Plan Note (Signed)
At baseline

## 2022-12-14 NOTE — Assessment & Plan Note (Signed)
-   As needed nicotine patch ordered ?

## 2022-12-14 NOTE — Assessment & Plan Note (Signed)
Continue donepezil 10 mg nightly

## 2022-12-14 NOTE — Hospital Course (Signed)
Natalie Knox is a 72 year old female with history of hypertension, GERD, stage IIIa chronic kidney disease, hyperlipidemia, cardiomegaly, atherosclerosis, who presents to the emergency department for chief concerns of syncope.  Vitals in the ED showed temperature 97.5, respiration rate of 18, heart rate of 44, blood pressure 141/51, SpO2 98% on room air.  Serum sodium is 141, potassium 3.9, chloride 111, bicarb 18, BUN of 19, serum creatinine 1.18, eGFR 48, nonfasting blood glucose 121, WBC 6.1, hemoglobin 11.9, platelets of 255.  High sensitivity troponin was 3.  ED treatment: None
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
## 2022-12-14 NOTE — Assessment & Plan Note (Signed)
Home atorvastatin 40 mg daily resumed

## 2022-12-14 NOTE — Assessment & Plan Note (Addendum)
Lisinopril 40 mg daily, amlodipine 10 mg daily, spironolactone 25 mg daily for resumed for 9/23 Hydralazine 5 mg IV every 8 hours as needed for SBP greater 165, 4 days ordered
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
## 2022-12-14 NOTE — Assessment & Plan Note (Addendum)
Patient reports she has completed treatment many years ago and she has not been sexually active since

## 2022-12-14 NOTE — Assessment & Plan Note (Addendum)
Query symptomatic bradycardia, patient took her home Coreg in the morning already Holding home Coreg medication on admission

## 2022-12-15 ENCOUNTER — Inpatient Hospital Stay (HOSPITAL_COMMUNITY)
Admit: 2022-12-15 | Discharge: 2022-12-15 | Disposition: A | Discharge status: Home / Self Care | Payer: 59 | Attending: Internal Medicine

## 2022-12-15 DIAGNOSIS — I2510 Atherosclerotic heart disease of native coronary artery without angina pectoris: Secondary | ICD-10-CM | POA: Diagnosis not present

## 2022-12-15 DIAGNOSIS — R55 Syncope and collapse: Secondary | ICD-10-CM | POA: Diagnosis not present

## 2022-12-15 LAB — BASIC METABOLIC PANEL
Anion gap: 4 — ABNORMAL LOW (ref 5–15)
BUN: 25 mg/dL — ABNORMAL HIGH (ref 8–23)
CO2: 23 mmol/L (ref 22–32)
Calcium: 8.7 mg/dL — ABNORMAL LOW (ref 8.9–10.3)
Chloride: 114 mmol/L — ABNORMAL HIGH (ref 98–111)
Creatinine, Ser: 1.22 mg/dL — ABNORMAL HIGH (ref 0.44–1.00)
GFR, Estimated: 47 mL/min — ABNORMAL LOW (ref >60)
Glucose, Bld: 80 mg/dL (ref 70–99)
Potassium: 3.5 mmol/L (ref 3.5–5.1)
Sodium: 141 mmol/L (ref 135–145)

## 2022-12-15 LAB — CBC
HCT: 32.6 % — ABNORMAL LOW (ref 36.0–46.0)
Hemoglobin: 10.6 g/dL — ABNORMAL LOW (ref 12.0–15.0)
MCH: 30.1 pg (ref 26.0–34.0)
MCHC: 32.5 g/dL (ref 30.0–36.0)
MCV: 92.6 fL (ref 80.0–100.0)
Platelets: 260 10*3/uL (ref 150–400)
RBC: 3.52 MIL/uL — ABNORMAL LOW (ref 3.87–5.11)
RDW: 13.9 % (ref 11.5–15.5)
WBC: 5.7 10*3/uL (ref 4.0–10.5)
nRBC: 0 % (ref 0.0–0.2)
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
## 2022-12-15 NOTE — Progress Notes (Signed)
Transition of Care [facility]) - Inpatient Brief Assessment   Patient Details  Name: Natalie Knox MRN: [id number] Date of Birth: 04-10-1950  Transition of Care [facility]) CM/SW Contact:    Truddie Hidden, RN Phone Number: 12/15/2022, 2:48 PM   Clinical Narrative: TOC continuing to follow patient's progress throughout discharge planning.   Transition of Care Asessment: Insurance and Status: Insurance coverage has been reviewed Patient has primary care physician: Yes Home environment has been reviewed: home Prior level of function:: independent Prior/Current Home Services: No current home services Social Determinants of Health Reivew: SDOH reviewed no interventions necessary Readmission risk has been reviewed: Yes Transition of care needs: no transition of care needs at this time

## 2022-12-15 NOTE — Progress Notes (Addendum)
PROGRESS NOTE    Natalie Knox  JYN:[id number] DOB: May 28, 1950 DOA: 12/14/2022 PCP: Kandyce Rud, MD     Brief Narrative:   Ms. Natalie Knox is a 72 year old female with history of hypertension, GERD, stage IIIa chronic kidney disease, hyperlipidemia, cardiomegaly, atherosclerosis, who presents to the emergency department for chief concerns of syncope.    She reports that she was sitting in her chair where her niece was doing her hair.  The next thing she knew there was EMS.  Her niece states that she called 911 because patient passed out while sitting on the chair.   Patient states she never fell to the floor and did not sustain head or neck injury.  Per her niece, she passed out for about 5 minutes.  She denies urinary or bowel incontinence.   At bedside, she denies chest pain, shortness of breath, dysuria, hematuria, diarrhea, nausea, vomiting, fever, double vision, unexpected weight changes.   She reports that she was having your symptoms like this and her cardiologist had decreased her home medication several months ago.  Patient states that she thinks that at this time she will need all of her medication for her heart rate to be discontinued.   Assessment & Plan:   Principal Problem:   Syncope Active Problems:   Alcohol dependence with alcohol-induced psychotic disorder with complication (HCC)   Essential hypertension   Tobacco dependence   History of neurosyphilis   Stage 3a chronic kidney disease (HCC)   Aortic atherosclerosis (HCC)   PAD (peripheral artery disease) (HCC)   Sinus bradycardia   Mild cognitive impairment   Hyperlipidemia   Syncope Etiology workup in progress, differentials include symptomatic bradycardia, med (coreg) side effect No hx of seizure or seizure-like activity Ct head/nec nothing acute No sig carotid disease on 2021 u/s Complete echo ordered Holding home Coreg 3.125 mg p.o. twice daily on admission Cardiology consulted Orthostats  ordered   Hyperlipidemia Home atorvastatin 40 mg daily resumed   Mild cognitive impairment Continue donepezil 10 mg nightly   Sinus bradycardia Query symptomatic bradycardia, patient took her home Coreg in the morning already Holding home Coreg medication on admission Cardiology to see   Stage 3a chronic kidney disease (HCC) At baseline   History of neurosyphilis Patient reports she has completed treatment many years ago and she has not been sexually active since   Tobacco dependence As needed nicotine patch ordered   Essential hypertension Bp controlled Lisinopril 40 mg daily, amlodipine 10 mg daily, spironolactone 25 mg daily for Coreg on hold as above     DVT prophylaxis: lovenox Code Status: full Family Communication: daughter updated @ bedside 9/23  Level of care: Telemetry Cardiac Status is: Inpatient Remains inpatient appropriate because: ongoing inpt w/u    Consultants:  cardiology  Procedures: none  Antimicrobials:  none    Subjective: Reports feeling fine  Objective: Vitals:   12/15/22 0309 12/15/22 0748 12/15/22 1134 12/15/22 1536  BP: (!) 142/49 (!) 154/52 (!) 134/48 (!) 148/48  Pulse: (!) 51 (!) 57 (!) 53 60  Resp: 20 18 19 20   Temp: 98.4 F (36.9 C) 97.8 F (36.6 C) 97.6 F (36.4 C) 98.2 F (36.8 C)  TempSrc: Oral Oral Oral Oral  SpO2: 94% 98% 98% 98%  Weight:      Height:        Intake/Output Summary (Last 24 hours) at 12/15/2022 1537 Last data filed at 12/15/2022 1031 Gross per 24 hour  Intake 360 ml  Output --  Net  360 ml   Filed Weights   12/14/22 1346 12/14/22 2025  Weight: 72.6 kg 77.5 kg    Examination:  General exam: Appears calm and comfortable  Respiratory system: Clear to auscultation. Respiratory effort normal. Cardiovascular system: S1 & S2 heard, RRR. No JVD, murmurs, rubs, gallops or clicks. No pedal edema. Gastrointestinal system: Abdomen is nondistended, soft and nontender. No organomegaly or masses  felt. Normal bowel sounds heard. Central nervous system: Alert and oriented. No focal neurological deficits. Extremities: Symmetric 5 x 5 power. Skin: No rashes, lesions or ulcers Psychiatry: Judgement and insight appear normal. Mood & affect appropriate.     Data Reviewed: I have personally reviewed following labs and imaging studies  CBC: Recent Labs  Lab 12/14/22 1346 12/15/22 0331  WBC 6.1 5.7  HGB 11.9* 10.6*  HCT 37.2 32.6*  MCV 94.4 92.6  PLT 255 260   Basic Metabolic Panel: Recent Labs  Lab 12/14/22 1346 12/15/22 0331  NA 141 141  K 3.9 3.5  CL 111 114*  CO2 18* 23  GLUCOSE 121* 80  BUN 19 25*  CREATININE 1.18* 1.22*  CALCIUM 8.9 8.7*   GFR: Estimated Creatinine Clearance: 40.7 mL/min (A) (by C-G formula based on SCr of 1.22 mg/dL (H)). Liver Function Tests: No results for input(s): "AST", "ALT", "ALKPHOS", "BILITOT", "PROT", "ALBUMIN" in the last 168 hours. No results for input(s): "LIPASE", "AMYLASE" in the last 168 hours. No results for input(s): "AMMONIA" in the last 168 hours. Coagulation Profile: No results for input(s): "INR", "PROTIME" in the last 168 hours. Cardiac Enzymes: No results for input(s): "CKTOTAL", "CKMB", "CKMBINDEX", "TROPONINI" in the last 168 hours. BNP (last 3 results) No results for input(s): "PROBNP" in the last 8760 hours. HbA1C: No results for input(s): "HGBA1C" in the last 72 hours. CBG: No results for input(s): "GLUCAP" in the last 168 hours. Lipid Profile: No results for input(s): "CHOL", "HDL", "LDLCALC", "TRIG", "CHOLHDL", "LDLDIRECT" in the last 72 hours. Thyroid Function Tests: No results for input(s): "TSH", "T4TOTAL", "FREET4", "T3FREE", "THYROIDAB" in the last 72 hours. Anemia Panel: No results for input(s): "VITAMINB12", "FOLATE", "FERRITIN", "TIBC", "IRON", "RETICCTPCT" in the last 72 hours. Urine analysis:    Component Value Date/Time   COLORURINE YELLOW (A) 12/02/2018 0910   APPEARANCEUR HAZY (A)  12/02/2018 0910   APPEARANCEUR Clear 01/04/2018 1400   LABSPEC 1.021 12/02/2018 0910   PHURINE 5.0 12/02/2018 0910   GLUCOSEU NEGATIVE 12/02/2018 0910   HGBUR NEGATIVE 12/02/2018 0910   BILIRUBINUR NEGATIVE 12/02/2018 0910   BILIRUBINUR Negative 01/04/2018 1400   KETONESUR NEGATIVE 12/02/2018 0910   PROTEINUR NEGATIVE 12/02/2018 0910   NITRITE NEGATIVE 12/02/2018 0910   LEUKOCYTESUR MODERATE (A) 12/02/2018 0910   Sepsis Labs: @LABRCNTIP (procalcitonin:4,lacticidven:4)  )No results found for this or any previous visit (from the past 240 hour(s)).       Radiology Studies: X-ray chest PA and lateral  Result Date: 12/14/2022 CLINICAL DATA:  106001 Syncope 106001. Episode of diaphoresis and slurred speech. EXAM: CHEST - 2 VIEW COMPARISON:  08/04/2018. FINDINGS: Bilateral lung fields are clear. Bilateral costophrenic angles are clear. Normal cardio-mediastinal silhouette. Aortic arch calcifications noted. No acute osseous abnormalities. The soft tissues are within normal limits. IMPRESSION: No active cardiopulmonary disease. Electronically Signed   By: Jules Schick M.D.   On: 12/14/2022 16:00   CT Head Wo Contrast  Result Date: 12/14/2022 CLINICAL DATA:  Head trauma, minor (Age >= 65y); Neck trauma (Age >= 65y). Syncope EXAM: CT HEAD WITHOUT CONTRAST CT CERVICAL SPINE WITHOUT CONTRAST TECHNIQUE: Multidetector  CT imaging of the head and cervical spine was performed following the standard protocol without intravenous contrast. Multiplanar CT image reconstructions of the cervical spine were also generated. RADIATION DOSE REDUCTION: This exam was performed according to the departmental dose-optimization program which includes automated exposure control, adjustment of the mA and/or kV according to patient size and/or use of iterative reconstruction technique. COMPARISON:  07/28/2017 FINDINGS: CT HEAD FINDINGS Brain: No evidence of acute infarction, hemorrhage, hydrocephalus, extra-axial collection  or mass lesion/mass effect. Vascular: Atherosclerotic calcifications involving the large vessels of the skull base. No unexpected hyperdense vessel. Skull: Normal. Negative for fracture or focal lesion. Sinuses/Orbits: No acute finding. Other: None. CT CERVICAL SPINE FINDINGS Alignment: Facet joints are aligned without dislocation or traumatic listhesis. Dens and lateral masses are aligned. Reversal of the cervical lordosis. Skull base and vertebrae: No acute fracture. No primary bone lesion or focal pathologic process. Soft tissues and spinal canal: No prevertebral fluid or swelling. No visible canal hematoma. Disc levels:  Moderate multilevel cervical spondylosis. Upper chest: Included lung apices are clear. Other: Bilateral carotid atherosclerosis. IMPRESSION: 1. No acute intracranial abnormality. 2. No acute fracture or subluxation of the cervical spine. Electronically Signed   By: Duanne Guess D.O.   On: 12/14/2022 14:31   CT Cervical Spine Wo Contrast  Result Date: 12/14/2022 CLINICAL DATA:  Head trauma, minor (Age >= 65y); Neck trauma (Age >= 65y). Syncope EXAM: CT HEAD WITHOUT CONTRAST CT CERVICAL SPINE WITHOUT CONTRAST TECHNIQUE: Multidetector CT imaging of the head and cervical spine was performed following the standard protocol without intravenous contrast. Multiplanar CT image reconstructions of the cervical spine were also generated. RADIATION DOSE REDUCTION: This exam was performed according to the departmental dose-optimization program which includes automated exposure control, adjustment of the mA and/or kV according to patient size and/or use of iterative reconstruction technique. COMPARISON:  07/28/2017 FINDINGS: CT HEAD FINDINGS Brain: No evidence of acute infarction, hemorrhage, hydrocephalus, extra-axial collection or mass lesion/mass effect. Vascular: Atherosclerotic calcifications involving the large vessels of the skull base. No unexpected hyperdense vessel. Skull: Normal. Negative  for fracture or focal lesion. Sinuses/Orbits: No acute finding. Other: None. CT CERVICAL SPINE FINDINGS Alignment: Facet joints are aligned without dislocation or traumatic listhesis. Dens and lateral masses are aligned. Reversal of the cervical lordosis. Skull base and vertebrae: No acute fracture. No primary bone lesion or focal pathologic process. Soft tissues and spinal canal: No prevertebral fluid or swelling. No visible canal hematoma. Disc levels:  Moderate multilevel cervical spondylosis. Upper chest: Included lung apices are clear. Other: Bilateral carotid atherosclerosis. IMPRESSION: 1. No acute intracranial abnormality. 2. No acute fracture or subluxation of the cervical spine. Electronically Signed   By: Duanne Guess D.O.   On: 12/14/2022 14:31        Scheduled Meds:  amLODipine  10 mg Oral Daily   aspirin EC  81 mg Oral Daily   atorvastatin  40 mg Oral Daily   cyanocobalamin  1,000 mcg Oral Daily   donepezil  10 mg Oral QHS   enoxaparin (LOVENOX) injection  40 mg Subcutaneous Q24H   lisinopril  40 mg Oral Daily   pantoprazole  40 mg Oral Daily   sodium chloride flush  3 mL Intravenous Q12H   spironolactone  25 mg Oral Daily   Continuous Infusions:   LOS: 1 day     Silvano Bilis, MD [facility]   If 7PM-7AM, please contact night-coverage [email or web address] Password [facility] 12/15/2022, 3:37 PM

## 2022-12-16 ENCOUNTER — Ambulatory Visit: Payer: 59

## 2022-12-16 ENCOUNTER — Inpatient Hospital Stay: Payer: 59

## 2022-12-16 DIAGNOSIS — R569 Unspecified convulsions: Secondary | ICD-10-CM

## 2022-12-16 DIAGNOSIS — R55 Syncope and collapse: Secondary | ICD-10-CM | POA: Diagnosis not present

## 2022-12-16 LAB — CBC
HCT: 31.4 % — ABNORMAL LOW (ref 36.0–46.0)
Hemoglobin: 10.5 g/dL — ABNORMAL LOW (ref 12.0–15.0)
MCH: 30.4 pg (ref 26.0–34.0)
MCHC: 33.4 g/dL (ref 30.0–36.0)
MCV: 91 fL (ref 80.0–100.0)
Platelets: 256 10*3/uL (ref 150–400)
RBC: 3.45 MIL/uL — ABNORMAL LOW (ref 3.87–5.11)
RDW: 14.4 % (ref 11.5–15.5)
WBC: 5.5 10*3/uL (ref 4.0–10.5)
nRBC: 0 % (ref 0.0–0.2)

## 2022-12-16 LAB — ECHOCARDIOGRAM COMPLETE
AR max vel: 1.37 cm2
AV Area VTI: 1.43 cm2
AV Area mean vel: 1.31 cm2
AV Mean grad: 9 mmHg
AV Peak grad: 17.4 mmHg
Ao pk vel: 2.09 m/s
Area-P 1/2: 2.37 cm2
Est EF: >75
Height: 64 in
S' Lateral: 2.4 cm
Weight: 2733.7 oz

## 2022-12-16 LAB — BASIC METABOLIC PANEL
Anion gap: 8 (ref 5–15)
BUN: 24 mg/dL — ABNORMAL HIGH (ref 8–23)
CO2: 21 mmol/L — ABNORMAL LOW (ref 22–32)
Calcium: 8.8 mg/dL — ABNORMAL LOW (ref 8.9–10.3)
Chloride: 116 mmol/L — ABNORMAL HIGH (ref 98–111)
Creatinine, Ser: 1.01 mg/dL — ABNORMAL HIGH (ref 0.44–1.00)
GFR, Estimated: 58 mL/min — ABNORMAL LOW (ref >60)
Glucose, Bld: 88 mg/dL (ref 70–99)
Potassium: 3.7 mmol/L (ref 3.5–5.1)
Sodium: 145 mmol/L (ref 135–145)

## 2022-12-16 LAB — GLUCOSE, CAPILLARY: Glucose-Capillary: 90 mg/dL (ref 70–99)

## 2022-12-16 MED ORDER — HYDROCHLOROTHIAZIDE 12.5 MG PO TABS
12.5000 mg | ORAL_TABLET | Freq: Every day | ORAL | Status: DC
  Administered 2022-12-16 – 2022-12-17 (×2): 12.5 mg via ORAL
  Filled 2022-12-16 (×2): qty 1

## 2022-12-16 MED ORDER — HYDRALAZINE HCL 25 MG PO TABS: 25.0000 mg | ORAL_TABLET | Freq: Three times a day (TID) | ORAL | Status: DC

## 2022-12-16 MED ORDER — SPIRONOLACTONE 25 MG PO TABS
50.0000 mg | ORAL_TABLET | Freq: Every day | ORAL | Status: DC
  Administered 2022-12-17: 50 mg via ORAL
  Filled 2022-12-16: qty 2

## 2022-12-16 NOTE — Evaluation (Signed)
Physical Therapy Evaluation and Discharge Patient Details Name: Natalie Knox MRN: [id number] DOB: 12/30/1950 Today's Date: 12/16/2022  History of Present Illness  Patient is a 72 year old female with history of hypertension, GERD, stage IIIa chronic kidney disease, hyperlipidemia, cardiomegaly, atherosclerosis, who presents after syncope with collapse.  Clinical Impression  Patient is agreeable to PT evaluation. She reports she is independent at baseline and goes to the gym 5 days per week.  The patient is independent with all mobility today. She ambulated 2 laps in the hallway without loss of balance, no dizziness reported, no dyspnea with exertion noted. Heart rate in the 80's before and after walking. No apparent acute PT needs as patient appears to be at baseline level of functional independence.       If plan is discharge home, recommend the following:  (no needs anticipated)   Can travel by private vehicle        Equipment Recommendations None recommended by PT  Recommendations for Other Services       Functional Status Assessment Patient has not had a recent decline in their functional status     Precautions / Restrictions Precautions Precautions: Other (comment) Precaution Comments: monitor HR Restrictions Weight Bearing Restrictions: No      Mobility  Bed Mobility Overal bed mobility: Independent                  Transfers Overall transfer level: Independent                      Ambulation/Gait Ambulation/Gait assistance: Independent Gait Distance (Feet): 335 Feet Assistive device: None Gait Pattern/deviations: WFL(Within Functional Limits)       General Gait Details: patient ambulated 2 laps in the hallway without difficulty. no shortness of breath, no dizziness reported. heart rate in the 80's before and after walking  Stairs            Wheelchair Mobility     Tilt Bed    Modified Rankin (Stroke Patients Only)        Balance Overall balance assessment: Independent                                           Pertinent Vitals/Pain Pain Assessment Pain Assessment: No/denies pain    Home Living Family/patient expects to be discharged to:: Private residence Living Arrangements: Children                      Prior Function Prior Level of Function : Independent/Modified Independent             Mobility Comments: patient goes to the gym 5 days a week and lifts weights       Extremity/Trunk Assessment   Upper Extremity Assessment Upper Extremity Assessment: Overall WFL for tasks assessed    Lower Extremity Assessment Lower Extremity Assessment: Overall WFL for tasks assessed       Communication      Cognition Arousal: Alert Behavior During Therapy: WFL for tasks assessed/performed Overall Cognitive Status: Within Functional Limits for tasks assessed                                          General Comments      Exercises  Assessment/Plan    PT Assessment Patient does not need any further PT services  PT Problem List         PT Treatment Interventions      PT Goals (Current goals can be found in the Care Plan section)  Acute Rehab PT Goals PT Goal Formulation: All assessment and education complete, DC therapy    Frequency       Co-evaluation               AM-PAC PT "6 Clicks" Mobility  Outcome Measure Help needed turning from your back to your side while in a flat bed without using bedrails?: None Help needed moving from lying on your back to sitting on the side of a flat bed without using bedrails?: None Help needed moving to and from a bed to a chair (including a wheelchair)?: None Help needed standing up from a chair using your arms (e.g., wheelchair or bedside chair)?: None Help needed to walk in hospital room?: None Help needed climbing 3-5 steps with a railing? : None 6 Click Score: 24    End of  Session   Activity Tolerance: Patient tolerated treatment well Patient left: in bed;with call bell/phone within reach Nurse Communication: Mobility status PT Visit Diagnosis: Muscle weakness (generalized) (M62.81)    Time: 1610-9604 PT Time Calculation (min) (ACUTE ONLY): 9 min   Charges:   PT Evaluation $PT Eval Low Complexity: 1 Low   PT General Charges $$ ACUTE PT VISIT: 1 Visit        Donna Bernard, PT, MPT   Natalie Knox 12/16/2022, 10:21 AM

## 2022-12-16 NOTE — Plan of Care (Signed)

## 2022-12-16 NOTE — Progress Notes (Addendum)
PROGRESS NOTE    Natalie Knox  ZHY:[id number] DOB: 11/13/1950 DOA: 12/14/2022 PCP: Kandyce Rud, MD     Brief Narrative:   Ms. Natalie Knox is a 72 year old female with history of hypertension, GERD, stage IIIa chronic kidney disease, hyperlipidemia, cardiomegaly, atherosclerosis, who presents to the emergency department for chief concerns of syncope.    She reports that she was sitting in her chair where her niece was doing her hair.  The next thing she knew there was EMS.  Her niece states that she called 911 because patient passed out while sitting on the chair.   Patient states she never fell to the floor and did not sustain head or neck injury.  Per her niece, she passed out for about 5 minutes.  She denies urinary or bowel incontinence.   At bedside, she denies chest pain, shortness of breath, dysuria, hematuria, diarrhea, nausea, vomiting, fever, double vision, unexpected weight changes.   She reports that she was having your symptoms like this and her cardiologist had decreased her home medication several months ago.  Patient states that she thinks that at this time she will need all of her medication for her heart rate to be discontinued.   Assessment & Plan:   Principal Problem:   Syncope Active Problems:   Alcohol dependence with alcohol-induced psychotic disorder with complication (HCC)   Essential hypertension   Tobacco dependence   History of neurosyphilis   Stage 3a chronic kidney disease (HCC)   Aortic atherosclerosis (HCC)   PAD (peripheral artery disease) (HCC)   Sinus bradycardia   Mild cognitive impairment   Hyperlipidemia   Syncope Etiology workup in progress, differentials include symptomatic bradycardia, med (coreg) side effect No hx of seizure or seizure-like activity but duration of LOC abnormal for cardiac syncope so cardiology has advised eeg which is pending Ct head/nec nothing acute Orthostats neg No sig carotid disease on 2021 u/s, repeat  u/s ordered by cardiology Complete echo ordered, no significant findings Holding home Coreg 3.125 mg p.o. twice daily on admission Cardiology following   Hyperlipidemia Home atorvastatin 40 mg daily resumed   Mild cognitive impairment Continue donepezil 10 mg nightly   Sinus bradycardia Query symptomatic bradycardia, patient took her home Coreg in the morning already Holding home Coreg medication on admission   Stage 3a chronic kidney disease (HCC) At baseline   History of neurosyphilis Patient reports she has completed treatment many years ago and she has not been sexually active since   Tobacco dependence As needed nicotine patch ordered   Essential hypertension Bp uncontrolled Lisinopril 40 mg daily, amlodipine 10 mg daily, spironolactone will increase from 25 to 50 and will add hctz Coreg on hold as above   CAD On coronary CT. No sig wall motion abnormalities or elevated troponin. Cardiology planning LHC tomorrow     DVT prophylaxis: lovenox Code Status: full Family Communication: daughter updated @ bedside 9/24  Level of care: Telemetry Cardiac Status is: Inpatient Remains inpatient appropriate because: ongoing inpt w/u    Consultants:  cardiology  Procedures: none  Antimicrobials:  none    Subjective: Reports feeling fine  Objective: Vitals:   12/15/22 2315 12/16/22 0322 12/16/22 0745 12/16/22 1156  BP: (!) 159/46 (!) 144/55 (!) 162/52 (!) 171/51  Pulse: 61 (!) 58 (!) 58 (!) 56  Resp: 18 18 19 18   Temp: 98.4 F (36.9 C) 98.4 F (36.9 C) 98.5 F (36.9 C) 97.7 F (36.5 C)  TempSrc: Oral Oral    SpO2: 96%  [id number]     Weight:       170.9 lb Date of Birth:  28-Jun-1950       BSA:          1.830 m Patient Age:    72 years       BP:           142/49 mmHg Patient Gender: F              HR:           78 bpm. Exam Location:  [facility] Procedure: 2D Echo, Cardiac Doppler and Color Doppler Indications:     R55 Syncope  History:         Patient has prior history of Echocardiogram examinations, most                  recent 12/09/2018. CAD, Signs/Symptoms:Chest Pain; Risk                  Factors:Hypertension and Dyslipidemia.  Sonographer:     Daphine Deutscher RDCS Referring Phys:  [id number] AMY N COX Diagnosing Phys: Yvonne Kendall MD IMPRESSIONS  1. Left ventricular ejection fraction, by estimation, is >75%. The left ventricle has hyperdynamic function. Left ventricular  endocardial border not optimally defined to evaluate regional wall motion. There is mild to moderate left ventricular hypertrophy. Left ventricular diastolic parameters are consistent with Grade I diastolic dysfunction (impaired relaxation). Elevated left atrial pressure.  2. Right ventricular systolic function is normal. The right ventricular size is normal. Tricuspid regurgitation signal is inadequate for assessing PA pressure.  3. Left atrial size was mildly dilated.  4. A small pericardial effusion is present. The pericardial effusion is circumferential. There is no evidence of cardiac tamponade.  5. The mitral valve is abnormal. No evidence of mitral valve regurgitation.  6. The aortic valve is tricuspid. There is mild calcification of the aortic valve. There is moderate thickening of the aortic valve. Aortic valve regurgitation is not visualized. Aortic valve sclerosis/calcification is present, without any evidence of aortic stenosis.  7. There is mild dilatation of the ascending aorta, measuring 39 mm.  8. The inferior vena cava is normal in size with greater than 50% respiratory variability, suggesting right atrial pressure of 3 mmHg. FINDINGS  Left Ventricle: Left ventricular ejection fraction, by estimation, is >75%. The left ventricle has hyperdynamic function. Left ventricular endocardial border not optimally defined to evaluate regional wall motion. The left ventricular internal cavity size was normal in size. There is mild to moderate left ventricular hypertrophy. Left ventricular diastolic parameters are consistent with Grade I diastolic dysfunction (impaired relaxation). Elevated left atrial pressure. Right Ventricle: The right ventricular size is normal. No increase in right ventricular wall thickness. Right ventricular systolic function is normal. Tricuspid regurgitation signal is inadequate for assessing PA pressure. Left Atrium: Left atrial size was mildly dilated. Right Atrium: Right atrial size  was normal in size. Pericardium: A small pericardial effusion is present. The pericardial effusion is circumferential. There is no evidence of cardiac tamponade. Mitral Valve: The mitral valve is abnormal. There is mild thickening of the mitral valve leaflet(s). Mild to moderate mitral annular calcification. No evidence of mitral valve regurgitation. Tricuspid Valve: The tricuspid valve is normal in structure. Tricuspid valve regurgitation is trivial. Aortic Valve: The aortic valve is tricuspid. There is mild calcification of the aortic valve. There is moderate thickening of the aortic valve. Aortic valve regurgitation is not visualized. Aortic valve sclerosis/calcification is present, without any evidence of  [id number]     Weight:       170.9 lb Date of Birth:  28-Jun-1950       BSA:          1.830 m Patient Age:    72 years       BP:           142/49 mmHg Patient Gender: F              HR:           78 bpm. Exam Location:  [facility] Procedure: 2D Echo, Cardiac Doppler and Color Doppler Indications:     R55 Syncope  History:         Patient has prior history of Echocardiogram examinations, most                  recent 12/09/2018. CAD, Signs/Symptoms:Chest Pain; Risk                  Factors:Hypertension and Dyslipidemia.  Sonographer:     Daphine Deutscher RDCS Referring Phys:  [id number] AMY N [facility] Diagnosing Phys: Yvonne Kendall MD IMPRESSIONS  1. Left ventricular ejection fraction, by estimation, is >75%. The left ventricle has hyperdynamic function. Left ventricular  endocardial border not optimally defined to evaluate regional wall motion. There is mild to moderate left ventricular hypertrophy. Left ventricular diastolic parameters are consistent with Grade I diastolic dysfunction (impaired relaxation). Elevated left atrial pressure.  2. Right ventricular systolic function is normal. The right ventricular size is normal. Tricuspid regurgitation signal is inadequate for assessing PA pressure.  3. Left atrial size was mildly dilated.  4. A small pericardial effusion is present. The pericardial effusion is circumferential. There is no evidence of cardiac tamponade.  5. The mitral valve is abnormal. No evidence of mitral valve regurgitation.  6. The aortic valve is tricuspid. There is mild calcification of the aortic valve. There is moderate thickening of the aortic valve. Aortic valve regurgitation is not visualized. Aortic valve sclerosis/calcification is present, without any evidence of aortic stenosis.  7. There is mild dilatation of the ascending aorta, measuring 39 mm.  8. The inferior vena cava is normal in size with greater than 50% respiratory variability, suggesting right atrial pressure of 3 mmHg. FINDINGS  Left Ventricle: Left ventricular ejection fraction, by estimation, is >75%. The left ventricle has hyperdynamic function. Left ventricular endocardial border not optimally defined to evaluate regional wall motion. The left ventricular internal cavity size was normal in size. There is mild to moderate left ventricular hypertrophy. Left ventricular diastolic parameters are consistent with Grade I diastolic dysfunction (impaired relaxation). Elevated left atrial pressure. Right Ventricle: The right ventricular size is normal. No increase in right ventricular wall thickness. Right ventricular systolic function is normal. Tricuspid regurgitation signal is inadequate for assessing PA pressure. Left Atrium: Left atrial size was mildly dilated. Right Atrium: Right atrial size  was normal in size. Pericardium: A small pericardial effusion is present. The pericardial effusion is circumferential. There is no evidence of cardiac tamponade. Mitral Valve: The mitral valve is abnormal. There is mild thickening of the mitral valve leaflet(s). Mild to moderate mitral annular calcification. No evidence of mitral valve regurgitation. Tricuspid Valve: The tricuspid valve is normal in structure. Tricuspid valve regurgitation is trivial. Aortic Valve: The aortic valve is tricuspid. There is mild calcification of the aortic valve. There is moderate thickening of the aortic valve. Aortic valve regurgitation is not visualized. Aortic valve sclerosis/calcification is present, without any evidence of  [id number]     Weight:       170.9 lb Date of Birth:  28-Jun-1950       BSA:          1.830 m Patient Age:    72 years       BP:           142/49 mmHg Patient Gender: F              HR:           78 bpm. Exam Location:  [facility] Procedure: 2D Echo, Cardiac Doppler and Color Doppler Indications:     R55 Syncope  History:         Patient has prior history of Echocardiogram examinations, most                  recent 12/09/2018. CAD, Signs/Symptoms:Chest Pain; Risk                  Factors:Hypertension and Dyslipidemia.  Sonographer:     Daphine Deutscher RDCS Referring Phys:  [id number] AMY N [facility] Diagnosing Phys: Yvonne Kendall MD IMPRESSIONS  1. Left ventricular ejection fraction, by estimation, is >75%. The left ventricle has hyperdynamic function. Left ventricular  endocardial border not optimally defined to evaluate regional wall motion. There is mild to moderate left ventricular hypertrophy. Left ventricular diastolic parameters are consistent with Grade I diastolic dysfunction (impaired relaxation). Elevated left atrial pressure.  2. Right ventricular systolic function is normal. The right ventricular size is normal. Tricuspid regurgitation signal is inadequate for assessing PA pressure.  3. Left atrial size was mildly dilated.  4. A small pericardial effusion is present. The pericardial effusion is circumferential. There is no evidence of cardiac tamponade.  5. The mitral valve is abnormal. No evidence of mitral valve regurgitation.  6. The aortic valve is tricuspid. There is mild calcification of the aortic valve. There is moderate thickening of the aortic valve. Aortic valve regurgitation is not visualized. Aortic valve sclerosis/calcification is present, without any evidence of aortic stenosis.  7. There is mild dilatation of the ascending aorta, measuring 39 mm.  8. The inferior vena cava is normal in size with greater than 50% respiratory variability, suggesting right atrial pressure of 3 mmHg. FINDINGS  Left Ventricle: Left ventricular ejection fraction, by estimation, is >75%. The left ventricle has hyperdynamic function. Left ventricular endocardial border not optimally defined to evaluate regional wall motion. The left ventricular internal cavity size was normal in size. There is mild to moderate left ventricular hypertrophy. Left ventricular diastolic parameters are consistent with Grade I diastolic dysfunction (impaired relaxation). Elevated left atrial pressure. Right Ventricle: The right ventricular size is normal. No increase in right ventricular wall thickness. Right ventricular systolic function is normal. Tricuspid regurgitation signal is inadequate for assessing PA pressure. Left Atrium: Left atrial size was mildly dilated. Right Atrium: Right atrial size  was normal in size. Pericardium: A small pericardial effusion is present. The pericardial effusion is circumferential. There is no evidence of cardiac tamponade. Mitral Valve: The mitral valve is abnormal. There is mild thickening of the mitral valve leaflet(s). Mild to moderate mitral annular calcification. No evidence of mitral valve regurgitation. Tricuspid Valve: The tricuspid valve is normal in structure. Tricuspid valve regurgitation is trivial. Aortic Valve: The aortic valve is tricuspid. There is mild calcification of the aortic valve. There is moderate thickening of the aortic valve. Aortic valve regurgitation is not visualized. Aortic valve sclerosis/calcification is present, without any evidence of  PROGRESS NOTE    Natalie Knox  ZHY:[id number] DOB: 11/13/1950 DOA: 12/14/2022 PCP: Kandyce Rud, MD     Brief Narrative:   Ms. Natalie Knox is a 72 year old female with history of hypertension, GERD, stage IIIa chronic kidney disease, hyperlipidemia, cardiomegaly, atherosclerosis, who presents to the emergency department for chief concerns of syncope.    She reports that she was sitting in her chair where her niece was doing her hair.  The next thing she knew there was EMS.  Her niece states that she called 911 because patient passed out while sitting on the chair.   Patient states she never fell to the floor and did not sustain head or neck injury.  Per her niece, she passed out for about 5 minutes.  She denies urinary or bowel incontinence.   At bedside, she denies chest pain, shortness of breath, dysuria, hematuria, diarrhea, nausea, vomiting, fever, double vision, unexpected weight changes.   She reports that she was having your symptoms like this and her cardiologist had decreased her home medication several months ago.  Patient states that she thinks that at this time she will need all of her medication for her heart rate to be discontinued.   Assessment & Plan:   Principal Problem:   Syncope Active Problems:   Alcohol dependence with alcohol-induced psychotic disorder with complication (HCC)   Essential hypertension   Tobacco dependence   History of neurosyphilis   Stage 3a chronic kidney disease (HCC)   Aortic atherosclerosis (HCC)   PAD (peripheral artery disease) (HCC)   Sinus bradycardia   Mild cognitive impairment   Hyperlipidemia   Syncope Etiology workup in progress, differentials include symptomatic bradycardia, med (coreg) side effect No hx of seizure or seizure-like activity but duration of LOC abnormal for cardiac syncope so cardiology has advised eeg which is pending Ct head/nec nothing acute Orthostats neg No sig carotid disease on 2021 u/s, repeat  u/s ordered by cardiology Complete echo ordered, no significant findings Holding home Coreg 3.125 mg p.o. twice daily on admission Cardiology following   Hyperlipidemia Home atorvastatin 40 mg daily resumed   Mild cognitive impairment Continue donepezil 10 mg nightly   Sinus bradycardia Query symptomatic bradycardia, patient took her home Coreg in the morning already Holding home Coreg medication on admission   Stage 3a chronic kidney disease (HCC) At baseline   History of neurosyphilis Patient reports she has completed treatment many years ago and she has not been sexually active since   Tobacco dependence As needed nicotine patch ordered   Essential hypertension Bp uncontrolled Lisinopril 40 mg daily, amlodipine 10 mg daily, spironolactone will increase from 25 to 50 and will add hctz Coreg on hold as above   CAD On coronary CT. No sig wall motion abnormalities or elevated troponin. Cardiology planning LHC tomorrow     DVT prophylaxis: lovenox Code Status: full Family Communication: daughter updated @ bedside 9/24  Level of care: Telemetry Cardiac Status is: Inpatient Remains inpatient appropriate because: ongoing inpt w/u    Consultants:  cardiology  Procedures: none  Antimicrobials:  none    Subjective: Reports feeling fine  Objective: Vitals:   12/15/22 2315 12/16/22 0322 12/16/22 0745 12/16/22 1156  BP: (!) 159/46 (!) 144/55 (!) 162/52 (!) 171/51  Pulse: 61 (!) 58 (!) 58 (!) 56  Resp: 18 18 19 18   Temp: 98.4 F (36.9 C) 98.4 F (36.9 C) 98.5 F (36.9 C) 97.7 F (36.5 C)  TempSrc: Oral Oral    SpO2: 96%

## 2022-12-16 NOTE — Plan of Care (Signed)
Alert and oriented. Remains on room air.  Daughter at bedside.  Denies any symptoms. Daughter and patient updated on plan of care per Dr. Ashok Pall.   Problem: Education: Goal: Knowledge of condition and prescribed therapy will improve Outcome: Progressing   Problem: Cardiac: Goal: Will achieve and/or maintain adequate cardiac output Outcome: Progressing   Problem: Physical Regulation: Goal: Complications related to the disease process, condition or treatment will be avoided or minimized Outcome: Progressing   Problem: Education: Goal: Knowledge of General Education information will improve Description: Including pain rating scale, medication(s)/side effects and non-pharmacologic comfort measures Outcome: Progressing   Problem: Health Behavior/Discharge Planning: Goal: Ability to manage health-related needs will improve Outcome: Progressing   Problem: Clinical Measurements: Goal: Ability to maintain clinical measurements within normal limits will improve Outcome: Progressing Goal: Will remain free from infection Outcome: Progressing Goal: Diagnostic test results will improve Outcome: Progressing Goal: Respiratory complications will improve Outcome: Progressing Goal: Cardiovascular complication will be avoided Outcome: Progressing   Problem: Activity: Goal: Risk for activity intolerance will decrease Outcome: Progressing   Problem: Nutrition: Goal: Adequate nutrition will be maintained Outcome: Progressing   Problem: Coping: Goal: Level of anxiety will decrease Outcome: Progressing   Problem: Elimination: Goal: Will not experience complications related to bowel motility Outcome: Progressing Goal: Will not experience complications related to urinary retention Outcome: Progressing   Problem: Pain Managment: Goal: General experience of comfort will improve Outcome: Progressing   Problem: Safety: Goal: Ability to remain free from injury will improve Outcome:  Progressing   Problem: Skin Integrity: Goal: Risk for impaired skin integrity will decrease Outcome: Progressing   Problem: Education: Goal: Understanding of CV disease, CV risk reduction, and recovery process will improve Outcome: Progressing Goal: Individualized Educational Video(s) Outcome: Progressing   Problem: Activity: Goal: Ability to return to baseline activity level will improve Outcome: Progressing   Problem: Cardiovascular: Goal: Ability to achieve and maintain adequate cardiovascular perfusion will improve Outcome: Progressing Goal: Vascular access site(s) Level 0-1 will be maintained Outcome: Progressing   Problem: Health Behavior/Discharge Planning: Goal: Ability to safely manage health-related needs after discharge will improve Outcome: Progressing

## 2022-12-16 NOTE — Progress Notes (Signed)
Patient Name: Natalie Knox Date of Encounter: 12/16/2022 [facility] Cardiologist: Yvonne Kendall, MD   Interval Summary  .    Patient seen on a.m. rounds.  Denies any chest pain or shortness of breath.  Denies any recurrent syncopal episodes.  Heart rates have improved overnight from the 40s into the 60s.  She has remained off of beta-blocker therapy.  Vital Signs .    Vitals:   12/15/22 1937 12/15/22 2315 12/16/22 0322 12/16/22 0745  BP: (!) 144/52 (!) 159/46 (!) 144/55 (!) 162/52  Pulse: (!) 55 61 (!) 58 (!) 58  Resp: 18 18 18 19   Temp: 97.6 F (36.4 C) 98.4 F (36.9 C) 98.4 F (36.9 C) 98.5 F (36.9 C)  TempSrc:  Oral Oral   SpO2: 96% 96% 96% 96%  Weight:   79.4 kg   Height:        Intake/Output Summary (Last 24 hours) at 12/16/2022 1052 Last data filed at 12/16/2022 1007 Gross per 24 hour  Intake 3 ml  Output --  Net 3 ml      12/16/2022    3:22 AM 12/14/2022    8:25 PM 12/14/2022    1:46 PM  Last 3 Weights  Weight (lbs) 175 lb 0.7 oz 170 lb 13.7 oz 160 lb  Weight (kg) 79.4 kg 77.5 kg 72.576 kg      Telemetry/ECG    Sinus bradycardia to sinus rhythm with rates in the 60s.  There are few alarms of questionable nonsustained V. tach versus artifact- Personally Reviewed  Physical Exam .   GEN: No acute distress.   Neck: No JVD, positive carotid bruits Cardiac: RRR, II/VI systolic murmur without rubs or gallops.  Respiratory: Clear to auscultation bilaterally. GI: Soft, nontender, non-distended  MS: No edema  Assessment & Plan .     Syncope with collapse -Presented after an extended blackout according to family for approximately 5 minutes -PTA carvedilol remains on hold -Repeat carotid duplex ordered, study from 2021 showed mild bilateral ICA stenosis of 1-39% -CT of the head and cervical spine negative for any acute findings -Recommend neurology evaluation as loss of consciousness for 5 minutes is not typical of cardiac syncope -Can consider  ZIO monitoring upon discharge  Sinus bradycardia -Presented with heart rates in the 40s -Review of telemetry shows gradual increase in heart rate throughout the day and overnight into the 50s and 60s -Recommend continuing to hold all AV nodal blocking agents -Continue with telemetry monitoring  Coronary artery disease noted on coronary CT -Patient with longstanding history of atypical left-sided chest pain -Coronary CTA completed 08/28/2022 for the coronary calcium score 2273 which was 99th percentile for age and sex matched control, heavily calcified plaque in the proximal LAD and RCA causing severe stenosis, recommendations postcardiac catheterization -High-sensitivity troponin negative x 2 -Patient continues to remain chest pain-free -Echocardiogram revealed LVEF greater than 75%, mild to moderate left ventricular hypertrophy, G1 DD, small pericardial effusion, aortic valve sclerosis without evidence of aortic stenosis -After further discussion with patient and family she has been scheduled for left catheterization 12/17/2022 -N.p.o. after midnight  Hypertension -Blood pressure 162/52 -Continued on lisinopril, spironolactone, amlodipine -Continue on as needed hydralazine -Vital signs per unit protocol  Hyperlipidemia -Continued on atorvastatin 40 mg daily -LDL 73  CKD stage IIIa -Serum creatinine 1.01 -Monitor urine output -Daily BMP -Monitor/trend/replete electrolytes as needed -Avoid nephrotoxic agents were able  History of neurosyphilis with mild cognitive impairment -Patient reports treated several years ago and continued  medication regimen -She is continued on Aricept 10 mg nightly -Continued management per IM   Informed Consent   Shared Decision Making/Informed Consent The risks [stroke (1 in 1000), death (1 in 1000), kidney failure [usually temporary] (1 in 500), bleeding (1 in 200), allergic reaction [possibly serious] (1 in 200)], benefits (diagnostic support and  management of coronary artery disease) and alternatives of a cardiac catheterization were discussed in detail with Ms. Kao and she is willing to proceed.     For questions or updates, please contact [facility] Please consult [email or web address] for contact info under        Signed, Yuliana Vandrunen, NP

## 2022-12-16 NOTE — Plan of Care (Signed)
Problem: Education: Goal: Knowledge of condition and prescribed therapy will improve Outcome: Progressing   Problem: Cardiac: Goal: Will achieve and/or maintain adequate cardiac output Outcome: Progressing   Problem: Physical Regulation: Goal: Complications related to the disease process, condition or treatment will be avoided or minimized Outcome: Progressing   Problem: Education: Goal: Knowledge of General Education information will improve Description: Including pain rating scale, medication(s)/side effects and non-pharmacologic comfort measures Outcome: Progressing   Problem: Health Behavior/Discharge Planning: Goal: Ability to manage health-related needs will improve Outcome: Progressing   Problem: Clinical Measurements: Goal: Ability to maintain clinical measurements within normal limits will improve Outcome: Progressing Goal: Will remain free from infection Outcome: Progressing Goal: Diagnostic test results will improve Outcome: Progressing Goal: Respiratory complications will improve Outcome: Progressing Goal: Cardiovascular complication will be avoided Outcome: Progressing   Problem: Activity: Goal: Risk for activity intolerance will decrease Outcome: Progressing   Problem: Nutrition: Goal: Adequate nutrition will be maintained Outcome: Progressing   Problem: Coping: Goal: Level of anxiety will decrease Outcome: Progressing   Problem: Elimination: Goal: Will not experience complications related to bowel motility Outcome: Progressing Goal: Will not experience complications related to urinary retention Outcome: Progressing   Problem: Pain Managment: Goal: General experience of comfort will improve Outcome: Progressing   Problem: Safety: Goal: Ability to remain free from injury will improve Outcome: Progressing   Problem: Skin Integrity: Goal: Risk for impaired skin integrity will decrease Outcome: Progressing   Problem: Education: Goal:  Understanding of CV disease, CV risk reduction, and recovery process will improve Outcome: Progressing Goal: Individualized Educational Video(s) Outcome: Progressing   Problem: Activity: Goal: Ability to return to baseline activity level will improve Outcome: Progressing   Problem: Cardiovascular: Goal: Ability to achieve and maintain adequate cardiovascular perfusion will improve Outcome: Progressing Goal: Vascular access site(s) Level 0-1 will be maintained Outcome: Progressing   Problem: Health Behavior/Discharge Planning: Goal: Ability to safely manage health-related needs after discharge will improve Outcome: Progressing

## 2022-12-16 NOTE — Procedures (Signed)
Patient Name: Natalie Knox  MRN: [id number]  Epilepsy Attending: Charlsie Quest  Referring Physician/Provider: Kathrynn Running, MD  Date: 12/16/2022 Duration: 29.29 mins  Patient history: 72yo F with syncope. EEG to evaluate for seizure  Level of alertness: Awake, asleep  AEDs during EEG study: None  Technical aspects: This EEG study was done with scalp electrodes positioned according to the 10-20 International system of electrode placement. Electrical activity was reviewed with band pass filter of 1-70Hz , sensitivity of 7 uV/mm, display speed of 20mm/sec with a 60Hz  notched filter applied as appropriate. EEG data were recorded continuously and digitally stored.  Video monitoring was available and reviewed as appropriate.  Description: The posterior dominant rhythm consists of 13-14 Hz activity of moderate voltage (25-35 uV) seen predominantly in posterior head regions, symmetric. Sleep was characterized by vertex waves, sleep spindles (12 to 14 Hz), maximal frontocentral region. Hyperventilation and photic stimulation were not performed.     IMPRESSION: This study is within normal limits. No seizures or epileptiform discharges were seen throughout the recording.  A normal interictal EEG does not exclude the diagnosis of epilepsy.   Analeya Luallen Annabelle Harman

## 2022-12-16 NOTE — Progress Notes (Signed)
Eeg done 

## 2022-12-17 ENCOUNTER — Encounter
Admission: EM | Disposition: A | Discharge status: Home / Self Care | Payer: Self-pay | Source: Home / Self Care | Attending: Obstetrics and Gynecology

## 2022-12-17 ENCOUNTER — Other Ambulatory Visit (INDEPENDENT_AMBULATORY_CARE_PROVIDER_SITE_OTHER): Payer: 59

## 2022-12-17 ENCOUNTER — Other Ambulatory Visit: Payer: Self-pay

## 2022-12-17 DIAGNOSIS — I25118 Atherosclerotic heart disease of native coronary artery with other forms of angina pectoris: Secondary | ICD-10-CM | POA: Diagnosis not present

## 2022-12-17 DIAGNOSIS — I2510 Atherosclerotic heart disease of native coronary artery without angina pectoris: Secondary | ICD-10-CM

## 2022-12-17 DIAGNOSIS — F17200 Nicotine dependence, unspecified, uncomplicated: Secondary | ICD-10-CM

## 2022-12-17 DIAGNOSIS — I517 Cardiomegaly: Secondary | ICD-10-CM | POA: Diagnosis not present

## 2022-12-17 DIAGNOSIS — R55 Syncope and collapse: Secondary | ICD-10-CM | POA: Diagnosis not present

## 2022-12-17 DIAGNOSIS — N1831 Chronic kidney disease, stage 3a: Secondary | ICD-10-CM

## 2022-12-17 DIAGNOSIS — R0789 Other chest pain: Secondary | ICD-10-CM | POA: Diagnosis not present

## 2022-12-17 DIAGNOSIS — G3184 Mild cognitive impairment, so stated: Secondary | ICD-10-CM

## 2022-12-17 DIAGNOSIS — R001 Bradycardia, unspecified: Secondary | ICD-10-CM | POA: Diagnosis not present

## 2022-12-17 DIAGNOSIS — I700 Atherosclerosis of aorta: Secondary | ICD-10-CM | POA: Diagnosis not present

## 2022-12-17 DIAGNOSIS — I6529 Occlusion and stenosis of unspecified carotid artery: Secondary | ICD-10-CM | POA: Insufficient documentation

## 2022-12-17 DIAGNOSIS — R931 Abnormal findings on diagnostic imaging of heart and coronary circulation: Secondary | ICD-10-CM

## 2022-12-17 HISTORY — PX: LEFT HEART CATH AND CORONARY ANGIOGRAPHY: CATH118249

## 2022-12-17 LAB — BASIC METABOLIC PANEL
Anion gap: 6 (ref 5–15)
BUN: 22 mg/dL (ref 8–23)
CO2: 23 mmol/L (ref 22–32)
Calcium: 8.5 mg/dL — ABNORMAL LOW (ref 8.9–10.3)
Chloride: 113 mmol/L — ABNORMAL HIGH (ref 98–111)
Creatinine, Ser: 1.08 mg/dL — ABNORMAL HIGH (ref 0.44–1.00)
GFR, Estimated: 54 mL/min — ABNORMAL LOW (ref >60)
Glucose, Bld: 83 mg/dL (ref 70–99)
Potassium: 3.6 mmol/L (ref 3.5–5.1)
Sodium: 142 mmol/L (ref 135–145)

## 2022-12-17 SURGERY — LEFT HEART CATH AND CORONARY ANGIOGRAPHY
Anesthesia: Moderate Sedation

## 2022-12-17 MED ORDER — SODIUM CHLORIDE 0.9% FLUSH: 3.0000 mL | Freq: Two times a day (BID) | INTRAVENOUS | Status: DC

## 2022-12-17 MED ORDER — MIDAZOLAM HCL 2 MG/2ML IJ SOLN
INTRAMUSCULAR | Status: DC | PRN
  Administered 2022-12-17: 1 mg via INTRAVENOUS

## 2022-12-17 MED ORDER — HEPARIN (PORCINE) IN NACL 1000-0.9 UT/500ML-% IV SOLN
INTRAVENOUS | Status: AC
  Filled 2022-12-17: qty 1000

## 2022-12-17 MED ORDER — FENTANYL CITRATE (PF) 100 MCG/2ML IJ SOLN
INTRAMUSCULAR | Status: AC
  Filled 2022-12-17: qty 2

## 2022-12-17 MED ORDER — HEPARIN (PORCINE) IN NACL 2000-0.9 UNIT/L-% IV SOLN
INTRAVENOUS | Status: DC | PRN
  Administered 2022-12-17: 1000 mL

## 2022-12-17 MED ORDER — IOHEXOL 300 MG/ML  SOLN
INTRAMUSCULAR | Status: DC | PRN
  Administered 2022-12-17: 24 mL

## 2022-12-17 MED ORDER — SODIUM CHLORIDE 0.9 % IV SOLN: INTRAVENOUS | Status: AC

## 2022-12-17 MED ORDER — SODIUM CHLORIDE 0.9 % IV SOLN: 250.0000 mL | INTRAVENOUS | Status: DC | PRN

## 2022-12-17 MED ORDER — MIDAZOLAM HCL 2 MG/2ML IJ SOLN
INTRAMUSCULAR | Status: AC
  Filled 2022-12-17: qty 2

## 2022-12-17 MED ORDER — FENTANYL CITRATE (PF) 100 MCG/2ML IJ SOLN
INTRAMUSCULAR | Status: DC | PRN
  Administered 2022-12-17: 25 ug via INTRAVENOUS

## 2022-12-17 MED ORDER — LIDOCAINE HCL (PF) 1 % IJ SOLN
INTRAMUSCULAR | Status: DC | PRN
  Administered 2022-12-17: 2 mL

## 2022-12-17 MED ORDER — ASPIRIN 81 MG PO CHEW: 81.0000 mg | CHEWABLE_TABLET | ORAL | Status: DC

## 2022-12-17 MED ORDER — ENOXAPARIN SODIUM 40 MG/0.4ML IJ SOSY
40.0000 mg | PREFILLED_SYRINGE | INTRAMUSCULAR | Status: DC
  Filled 2022-12-17: qty 0.4

## 2022-12-17 MED ORDER — HEPARIN SODIUM (PORCINE) 1000 UNIT/ML IJ SOLN
INTRAMUSCULAR | Status: AC
  Filled 2022-12-17: qty 10

## 2022-12-17 MED ORDER — LIDOCAINE HCL 1 % IJ SOLN
INTRAMUSCULAR | Status: AC
  Filled 2022-12-17: qty 20

## 2022-12-17 MED ORDER — VERAPAMIL HCL 2.5 MG/ML IV SOLN
INTRAVENOUS | Status: AC
  Filled 2022-12-17: qty 2

## 2022-12-17 MED ORDER — SPIRONOLACTONE 25 MG PO TABS: 50.0000 mg | ORAL_TABLET | Freq: Every day | ORAL | 3 refills | Status: DC

## 2022-12-17 MED ORDER — HYDRALAZINE HCL 20 MG/ML IJ SOLN
INTRAMUSCULAR | Status: AC
  Filled 2022-12-17: qty 1

## 2022-12-17 MED ORDER — SODIUM CHLORIDE 0.9 % IV SOLN: INTRAVENOUS | Status: DC

## 2022-12-17 MED ORDER — VERAPAMIL HCL 2.5 MG/ML IV SOLN
INTRAVENOUS | Status: DC | PRN
  Administered 2022-12-17 (×2): 2.5 mg via INTRA_ARTERIAL

## 2022-12-17 MED ORDER — HEPARIN SODIUM (PORCINE) 1000 UNIT/ML IJ SOLN
INTRAMUSCULAR | Status: DC | PRN
  Administered 2022-12-17: 4000 [IU] via INTRAVENOUS

## 2022-12-17 MED ORDER — SODIUM CHLORIDE 0.9% FLUSH: 3.0000 mL | INTRAVENOUS | Status: DC | PRN

## 2022-12-17 MED ORDER — HYDRALAZINE HCL 20 MG/ML IJ SOLN
10.0000 mg | INTRAMUSCULAR | Status: AC | PRN
  Administered 2022-12-17 (×2): 10 mg via INTRAVENOUS

## 2022-12-17 SURGICAL SUPPLY — 11 items
CATH 5FR JL3.5 JR4 ANG PIG MP (CATHETERS) IMPLANT
DEVICE RAD TR BAND REGULAR (VASCULAR PRODUCTS) IMPLANT
DRAPE BRACHIAL (DRAPES) IMPLANT
GLIDESHEATH SLEND SS 6F .021 (SHEATH) IMPLANT
GUIDEWIRE INQWIRE 1.5J.035X260 (WIRE) IMPLANT
INQWIRE 1.5J .035X260CM (WIRE) ×2
PACK CARDIAC CATH (CUSTOM PROCEDURE TRAY) ×1 IMPLANT
PROTECTION STATION PRESSURIZED (MISCELLANEOUS) ×1
SET ATX-X65L (MISCELLANEOUS) IMPLANT
STATION PROTECTION PRESSURIZED (MISCELLANEOUS) IMPLANT
WIRE HITORQ VERSACORE ST 145CM (WIRE) IMPLANT

## 2022-12-17 NOTE — OR Nursing (Signed)
Messaged Dr End and Dr Ashok Pall regarding hydralazine x two with brief drop in bp but return of SBP to 179 at 1700.

## 2022-12-17 NOTE — Interval H&P Note (Signed)
History and Physical Interval Note:  12/17/2022 2:38 PM  Natalie Knox  has presented today for surgery, with the diagnosis of abnormal coronary CTA and syncope with collapse.  The various methods of treatment have been discussed with the patient and family. After consideration of risks, benefits and other options for treatment, the patient has consented to  Procedure(s): LEFT HEART CATH AND CORONARY ANGIOGRAPHY (N/A) as a surgical intervention.  [name] history has been reviewed, patient examined, no change in status, stable for surgery.  I have reviewed the patient's chart and labs.  Questions were answered to the patient's satisfaction.    Cath Lab Visit (complete for each Cath Lab visit)  Clinical Evaluation Leading to the Procedure:   ACS: No.  Non-ACS:    Anginal Classification: CCS I  Anti-ischemic medical therapy: Minimal Therapy (1 class of medications)  Non-Invasive Test Results: Severe LAD and RCA disease by CT-FFR -> high risk  Prior CABG: No previous CABG  Natalie Knox

## 2022-12-17 NOTE — Progress Notes (Signed)
   Patient Name: Natalie Knox Date of Encounter: 12/17/2022 [facility] Cardiologist: Yvonne Kendall, MD   Interval Summary  .    Patient seen on a.m. rounds.  Denies any chest pain, shortness of breath, dizziness, lightheadedness or recurrent syncopal episodes.  Heart rates have improved overnight 40s into the 50s and 60s.  She is continue to remain off of beta-blocker therapy.  She is tentatively scheduled for left heart catheterization today.  Vital Signs .    Vitals:   12/16/22 1737 12/16/22 1938 12/16/22 2256 12/17/22 0519  BP: (!) 151/46 (!) 147/51 (!) 134/49 (!) 155/51  Pulse: 64 (!) 59 (!) 57 (!) 57  Resp: 17 18 18 18   Temp: 97.9 F (36.6 C) 97.9 F (36.6 C) 97.6 F (36.4 C) 98 F (36.7 C)  TempSrc: Oral Oral Oral   SpO2: 98% 97% 96% 95%  Weight:      Height:        Intake/Output Summary (Last 24 hours) at 12/17/2022 0800 Last data filed at 12/16/2022 1007 Gross per 24 hour  Intake 3 ml  Output --  Net 3 ml      12/16/2022    3:22 AM 12/14/2022    8:25 PM 12/14/2022    1:46 PM  Last 3 Weights  Weight (lbs) 175 lb 0.7 oz 170 lb 13.7 oz 160 lb  Weight (kg) 79.4 kg 77.5 kg 72.576 kg      Telemetry/ECG    Sinus bradycardia rates 50-68 with occasional unifocal PVC- Personally Reviewed  Physical Exam .   GEN: No acute distress.   Neck: No JVD Cardiac: RRR, II/VI systolic murmur without rubs or gallops.  Respiratory: Clear to auscultation bilaterally. GI: Soft, nontender, non-distended  MS: No edema  Assessment & Plan .     Syncope with collapse -Presented after extended episode of loss of consciousness according to family was approximately 5 minutes -Has remained off of beta-blocker therapy with improvement in heart rates -CT of the head and cervical spine negative for any acute findings -Will need Zio AT monitoring for a minimum of 2 weeks on discharge -Carotid artery duplex revealed mild calcified plaque in the right ICA consistent with less  than 50% stenosis, moderate calcified plaque in the left ICA consistent with 50 to 69% stenosis, progression since previous study  Sinus bradycardia -Rates been 50-60 overnight on telemetry -Continue to avoid AV nodal blocking agents -Continue with telemetry monitoring  Coronary artery disease noted on coronary CTA -Coronary CTA completed 08/28/2022 with a coronary calcium score of 2273 which is 99 percentile for age and sex matched control, heavily calcified plaque in the proximal LAD and RCA causing severe stenosis recommendations for cardiac catheterization -High-sensitivity troponin negative x 2 -Echocardiogram revealed an LVEF greater than 75%, mild to moderate LVH, G1 DD, small pericardial effusion, aortic valve sclerosis without evidence of aortic stenosis -Scheduled for tentative left heart catheterization today -Remains n.p.o.  Hypertension -Blood pressure 148/54 -Continue on amlodipine, HCTZ, lisinopril, and spironolactone -Vital signs per unit protocol  Hyperlipidemia -Continue statin therapy  CKD stage IIIa -Serum creatinine 1.08 -Continue to monitor urine output -Daily BMP -Monitor/trend/replete electrolytes as needed -Avoid nephrotoxic agents were able  History of neurosyphilis with mild cognitive impairment -Continued on Aricept 10 mg nightly -Continue management per IM For questions or updates, please contact [facility] Please consult [email or web address] for contact info under        Signed, Amour Cutrone, NP

## 2022-12-17 NOTE — Discharge Summary (Signed)
Natalie Knox ZOX:[id number] DOB: 09/22/1950 DOA: 12/14/2022  PCP: Kandyce Rud, MD  Admit date: 12/14/2022 Discharge date: 12/17/2022  Time spent: 45 minutes  Recommendations for Outpatient Follow-up:  Cardiology f/u 4 weeks PCP hospital f/u 1-2 weeks, check bmp then Attention to BP at cardiology and/or PCP f/u    Discharge Diagnoses:  Principal Problem:   Syncope and collapse Active Problems:   Alcohol dependence with alcohol-induced psychotic disorder with complication [facility])   Essential hypertension   Tobacco dependence   History of neurosyphilis   Stage 3a chronic kidney disease (HCC)   Aortic atherosclerosis (HCC)   PAD (peripheral artery disease) (HCC)   Bradycardia   Mild cognitive impairment   Hyperlipidemia   Coronary artery disease of native artery of native heart with stable angina pectoris (HCC)   Abnormal cardiac CT angiography   Carotid stenosis   Discharge Condition: stable  Diet recommendation: heart healthy  Filed Weights   12/14/22 1346 12/14/22 2025 12/16/22 0322  Weight: 72.6 kg 77.5 kg 79.4 kg    History of present illness:  From admission h and p Ms. Natalie Knox is a 72 year old female with history of hypertension, GERD, stage IIIa chronic kidney disease, hyperlipidemia, cardiomegaly, atherosclerosis, who presents to the emergency department for chief concerns of syncope.   At bedside, patient was able to tell me her name, her age, current location, and current calendar year.   She reports that she was sitting in her chair where her niece was doing her hair.  The next thing she knew there was EMS.  Her niece states that she called 911 because patient passed out while sitting on the chair.   Patient states she never fell to the floor and did not sustain head or neck injury.  Per her niece, she passed out for about 5 minutes.  She denies urinary or bowel incontinence.   At bedside, she denies chest pain, shortness of breath, dysuria, hematuria,  diarrhea, nausea, vomiting, fever, double vision, unexpected weight changes.   She reports that she was having your symptoms like this and her cardiologist had decreased her home medication several months ago.  Patient states that she thinks that at this time she will need all of her medication for her heart rate to be discontinued.  Hospital Course:  Patient presents with syncope. CT head/neck negative. Orthostats negative. Carotid u/s with moderate left ICA stenosis (50-69) and mild disease on the right, unlikely to be cause of syncope. Was found to be bradycardic and home coreg has been held. Given duration of syncope (5 minutes per report) seizure was entertained though no report of seizure activity; here EEG was normal. Patient's prior abnormal cardiac CT was noted by cardiology and so that team took her for left heart cath which showed mild CAD with advice for medical management. Brief junctional rhythm encountered during left heart cath and so cardiology advises ziopatch at discharge (placed) and f/u with them in 4 weeks, they are also considering cardiac mri to eval for hypertrophic cardiomyopathy. She had no recurrence of syncope while here. Evaluated by PT, no home health needs identified. BP elevated here; coreg held as above, spironolactone dose increased. Will need BMP at pcp hospital f/u.  Procedures: Left heart cath  Consultations: cardiology  Discharge Exam: Vitals:   12/17/22 1615 12/17/22 1630  BP: (!) 164/63 (!) 161/50  Pulse: 65 65  Resp: (!) 21 16  Temp:    SpO2: 97% 98%    General exam: Appears calm and comfortable  Respiratory system: Clear to auscultation. Respiratory effort normal. Cardiovascular system: S1 & S2 heard, RRR. No JVD, murmurs, rubs, gallops or clicks. No pedal edema. Gastrointestinal system: Abdomen is nondistended, soft and nontender. No organomegaly or masses felt. Normal bowel sounds heard. Central nervous system: Alert and oriented. No focal  neurological deficits. Extremities: Symmetric 5 x 5 power. Skin: No rashes, lesions or ulcers Psychiatry: Judgement and insight appear normal. Mood & affect appropriate.   Discharge Instructions   Discharge Instructions     Diet - low sodium heart healthy   Complete by: As directed    Increase activity slowly   Complete by: As directed       Allergies as of 12/17/2022   No Known Allergies      Medication List     STOP taking these medications    carvedilol 3.125 MG tablet Commonly known as: COREG   carvedilol 6.25 MG tablet Commonly known as: COREG       TAKE these medications    amLODipine 10 MG tablet Commonly known as: NORVASC Take 1 tablet (10 mg total) by mouth daily.   aspirin EC 81 MG tablet Take 1 tablet (81 mg total) by mouth daily. Swallow whole.   atorvastatin 40 MG tablet Commonly known as: LIPITOR TAKE 1 TABLET BY MOUTH EVERY DAY   cyanocobalamin 1000 MCG tablet Commonly known as: VITAMIN B12 Take 1,000 mcg by mouth daily.   donepezil 10 MG tablet Commonly known as: ARICEPT Take 10 mg by mouth at bedtime.   lisinopril 40 MG tablet Commonly known as: ZESTRIL Take 40 mg by mouth daily.   pantoprazole 40 MG tablet Commonly known as: PROTONIX Take 40 mg by mouth daily.   spironolactone 25 MG tablet Commonly known as: ALDACTONE Take 2 tablets (50 mg total) by mouth daily. What changed: how much to take       No Known Allergies  Follow-up Information     End, Cristal Deer, MD Follow up.   Specialty: Cardiology Why: call to schedule an appointment in 4 weeks Contact information: [address] [place] [address] [phone]         Kandyce Rud, MD Follow up.   Specialty: Family Medicine Contact information: [address] [facility] [place] [address] [phone]                  The results of significant diagnostics from this hospitalization  (including imaging, microbiology, ancillary and laboratory) are listed below for reference.    Significant Diagnostic Studies: CARDIAC CATHETERIZATION  Result Date: 12/17/2022 Conclusions: Mild-moderate, nonobstructive coronary artery disease with extensive mural calcification, particularly involving the LAD. Normal left ventricular filling pressure (10 mmHg).  Post a-wave LV pressure is mildly elevated (20 mmHg), suggesting an element of diastolic dysfunction. Significant blood pressure dropped during LVOT pullback associated with transient junctional rhythm (SBP 167->122 mmHg).  Blood pressure promptly returned to baseline with resumption of normal sinus rhythm. Recommendations: Medical therapy and risk factor modification to prevent progression of coronary artery disease. Placed 14-day ambulatory cardiac monitor (ZIO AT) for further evaluation of potential arrhythmia. Based on results of event monitor, cardiac MRI +/- electrophysiology consultation will need to be considered at outpatient follow-up. Hold hydrochlorothiazide to minimize risk for intravascular volume depletion. Yvonne Kendall, MD [facility]  US Carotid Bilateral  Result Date: 12/17/2022 CLINICAL DATA:  Syncope and collapse EXAM: BILATERAL CAROTID DUPLEX ULTRASOUND TECHNIQUE: Wallace Cullens scale imaging, color Doppler and duplex  ultrasound were performed of bilateral carotid and vertebral arteries in the neck. COMPARISON:  CT cervical spine 12/14/2022 FINDINGS: Criteria: Quantification of carotid stenosis is based on velocity parameters that correlate the residual internal carotid diameter with NASCET-based stenosis levels, using the diameter of the distal internal carotid lumen as the denominator for stenosis measurement. The following velocity measurements were obtained: RIGHT ICA: 114/21 cm/sec CCA: 123/14 cm/sec SYSTOLIC ICA/CCA RATIO:  1.5 ECA: 43 cm/sec LEFT ICA: 175/25 cm/sec CCA: 183/8 cm/sec SYSTOLIC ICA/CCA RATIO:  2.1 ECA: 48 cm/sec  RIGHT CAROTID ARTERY: Mild calcified plaque of the proximal right common carotid artery. Mild-to-moderate calcified plaque of the right internal carotid artery origin. RIGHT VERTEBRAL ARTERY:  Antegrade flow. LEFT CAROTID ARTERY: Moderate calcified plaque of the left internal carotid artery origin. LEFT VERTEBRAL ARTERY:  Antegrade flow. IMPRESSION: 1. Mild calcified plaque of the right ICA origin with velocity parameters consistent with less than 50% stenosis. 2. Moderate calcified plaque of the left internal carotid artery origin with velocity parameters consistent with 50-69% stenosis. Electronically Signed   By: Acquanetta Belling M.D.   On: 12/17/2022 08:14   EEG adult  Result Date: 12/16/2022 Charlsie Quest, MD     12/16/2022  5:32 PM Patient Name: Natalie Knox MRN: [id number] Epilepsy Attending: Charlsie Quest Referring Physician/Provider: Kathrynn Running, MD Date: 12/16/2022 Duration: 29.29 mins Patient history: 72yo F with syncope. EEG to evaluate for seizure Level of alertness: Awake, asleep AEDs during EEG study: None Technical aspects: This EEG study was done with scalp electrodes positioned according to the 10-20 International system of electrode placement. Electrical activity was reviewed with band pass filter of 1-70Hz , sensitivity of 7 uV/mm, display speed of 41mm/sec with a 60Hz  notched filter applied as appropriate. EEG data were recorded continuously and digitally stored.  Video monitoring was available and reviewed as appropriate. Description: The posterior dominant rhythm consists of 13-14 Hz activity of moderate voltage (25-35 uV) seen predominantly in posterior head regions, symmetric. Sleep was characterized by vertex waves, sleep spindles (12 to 14 Hz), maximal frontocentral region. Hyperventilation and photic stimulation were not performed.   IMPRESSION: This study is within normal limits. No seizures or epileptiform discharges were seen throughout the recording. A normal interictal  EEG does not exclude the diagnosis of epilepsy. Charlsie Quest   ECHOCARDIOGRAM COMPLETE  Result Date: 12/16/2022    ECHOCARDIOGRAM REPORT   Patient Name:   Natalie Knox Date of Exam: 12/15/2022 Medical Rec #:  [id number]      Height:       64.0 in Accession #:    [id number]     Weight:       170.9 lb Date of Birth:  [birth year]       BSA:          1.830 m Patient Age:    72 years       BP:           142/49 mmHg Patient Gender: F              HR:           78 bpm. Exam Location:  [facility] Procedure: 2D Echo, Cardiac Doppler and Color Doppler Indications:     R55 Syncope  History:         Patient has prior history of Echocardiogram examinations, most                  recent 12/09/2018. CAD, Signs/Symptoms:Chest Pain; Risk  Factors:Hypertension and Dyslipidemia.  Sonographer:     Daphine Deutscher RDCS Referring Phys:  [id number] AMY N COX Diagnosing Phys: Yvonne Kendall MD IMPRESSIONS  1. Left ventricular ejection fraction, by estimation, is >75%. The left ventricle has hyperdynamic function. Left ventricular endocardial border not optimally defined to evaluate regional wall motion. There is mild to moderate left ventricular hypertrophy. Left ventricular diastolic parameters are consistent with Grade I diastolic dysfunction (impaired relaxation). Elevated left atrial pressure.  2. Right ventricular systolic function is normal. The right ventricular size is normal. Tricuspid regurgitation signal is inadequate for assessing PA pressure.  3. Left atrial size was mildly dilated.  4. A small pericardial effusion is present. The pericardial effusion is circumferential. There is no evidence of cardiac tamponade.  5. The mitral valve is abnormal. No evidence of mitral valve regurgitation.  6. The aortic valve is tricuspid. There is mild calcification of the aortic valve. There is moderate thickening of the aortic valve. Aortic valve regurgitation is not visualized. Aortic valve sclerosis/calcification is  present, without any evidence of aortic stenosis.  7. There is mild dilatation of the ascending aorta, measuring 39 mm.  8. The inferior vena cava is normal in size with greater than 50% respiratory variability, suggesting right atrial pressure of 3 mmHg. FINDINGS  Left Ventricle: Left ventricular ejection fraction, by estimation, is >75%. The left ventricle has hyperdynamic function. Left ventricular endocardial border not optimally defined to evaluate regional wall motion. The left ventricular internal cavity size was normal in size. There is mild to moderate left ventricular hypertrophy. Left ventricular diastolic parameters are consistent with Grade I diastolic dysfunction (impaired relaxation). Elevated left atrial pressure. Right Ventricle: The right ventricular size is normal. No increase in right ventricular wall thickness. Right ventricular systolic function is normal. Tricuspid regurgitation signal is inadequate for assessing PA pressure. Left Atrium: Left atrial size was mildly dilated. Right Atrium: Right atrial size was normal in size. Pericardium: A small pericardial effusion is present. The pericardial effusion is circumferential. There is no evidence of cardiac tamponade. Mitral Valve: The mitral valve is abnormal. There is mild thickening of the mitral valve leaflet(s). Mild to moderate mitral annular calcification. No evidence of mitral valve regurgitation. Tricuspid Valve: The tricuspid valve is normal in structure. Tricuspid valve regurgitation is trivial. Aortic Valve: The aortic valve is tricuspid. There is mild calcification of the aortic valve. There is moderate thickening of the aortic valve. Aortic valve regurgitation is not visualized. Aortic valve sclerosis/calcification is present, without any evidence of aortic stenosis. Aortic valve mean gradient measures 9.0 mmHg. Aortic valve peak gradient measures 17.4 mmHg. Aortic valve area, by VTI measures 1.43 cm. Pulmonic Valve: The pulmonic  valve was normal in structure. Pulmonic valve regurgitation is not visualized. No evidence of pulmonic stenosis. Aorta: The aortic root is normal in size and structure. There is mild dilatation of the ascending aorta, measuring 39 mm. Pulmonary Artery: The pulmonary artery is not well seen. Venous: The inferior vena cava is normal in size with greater than 50% respiratory variability, suggesting right atrial pressure of 3 mmHg. IAS/Shunts: The interatrial septum was not well visualized.  LEFT VENTRICLE PLAX 2D LVIDd:         4.30 cm   Diastology LVIDs:         2.40 cm   LV e' medial:    4.24 cm/s LV PW:         1.35 cm   LV E/e' medial:  19.1 LV IVS:  1.16 cm   LV e' lateral:   4.79 cm/s LVOT diam:     1.70 cm   LV E/e' lateral: 16.9 LV SV:         66 LV SV Index:   36 LVOT Area:     2.27 cm  RIGHT VENTRICLE             IVC RV Basal diam:  3.50 cm     IVC diam: 1.10 cm RV S prime:     12.70 cm/s TAPSE (M-mode): 2.9 cm LEFT ATRIUM             Index        RIGHT ATRIUM           Index LA diam:        5.10 cm 2.79 cm/m   RA Area:     16.50 cm LA Vol (A2C):   81.0 ml 44.27 ml/m  RA Volume:   45.60 ml  24.92 ml/m LA Vol (A4C):   58.4 ml 31.92 ml/m LA Biplane Vol: 72.9 ml 39.84 ml/m  AORTIC VALVE AV Area (Vmax):    1.37 cm AV Area (Vmean):   1.31 cm AV Area (VTI):     1.43 cm AV Vmax:           208.54 cm/s AV Vmean:          142.938 cm/s AV VTI:            0.458 m AV Peak Grad:      17.4 mmHg AV Mean Grad:      9.0 mmHg LVOT Vmax:         125.50 cm/s LVOT Vmean:        82.750 cm/s LVOT VTI:          0.289 m LVOT/AV VTI ratio: 0.63  AORTA Ao Root diam: 3.30 cm Ao Asc diam:  3.90 cm MITRAL VALVE MV Area (PHT): 2.37 cm     SHUNTS MV Decel Time: 320 msec     Systemic VTI:  0.29 m MV E velocity: 81.00 cm/s   Systemic Diam: 1.70 cm MV A velocity: 149.00 cm/s MV E/A ratio:  0.54 Cristal Deer End MD Electronically signed by Yvonne Kendall MD Signature Date/Time: 12/16/2022/7:46:49 AM    Final    X-ray chest PA  and lateral  Result Date: 12/14/2022 CLINICAL DATA:  191478 Syncope [id number]. Episode of diaphoresis and slurred speech. EXAM: CHEST - 2 VIEW COMPARISON:  08/04/2018. FINDINGS: Bilateral lung fields are clear. Bilateral costophrenic angles are clear. Normal cardio-mediastinal silhouette. Aortic arch calcifications noted. No acute osseous abnormalities. The soft tissues are within normal limits. IMPRESSION: No active cardiopulmonary disease. Electronically Signed   By: Jules Schick M.D.   On: 12/14/2022 16:00   CT Head Wo Contrast  Result Date: 12/14/2022 CLINICAL DATA:  Head trauma, minor (Age >= 65y); Neck trauma (Age >= 65y). Syncope EXAM: CT HEAD WITHOUT CONTRAST CT CERVICAL SPINE WITHOUT CONTRAST TECHNIQUE: Multidetector CT imaging of the head and cervical spine was performed following the standard protocol without intravenous contrast. Multiplanar CT image reconstructions of the cervical spine were also generated. RADIATION DOSE REDUCTION: This exam was performed according to the departmental dose-optimization program which includes automated exposure control, adjustment of the mA and/or kV according to patient size and/or use of iterative reconstruction technique. COMPARISON:  07/28/2017 FINDINGS: CT HEAD FINDINGS Brain: No evidence of acute infarction, hemorrhage, hydrocephalus, extra-axial collection or mass lesion/mass effect. Vascular: Atherosclerotic calcifications involving the large vessels  of the skull base. No unexpected hyperdense vessel. Skull: Normal. Negative for fracture or focal lesion. Sinuses/Orbits: No acute finding. Other: None. CT CERVICAL SPINE FINDINGS Alignment: Facet joints are aligned without dislocation or traumatic listhesis. Dens and lateral masses are aligned. Reversal of the cervical lordosis. Skull base and vertebrae: No acute fracture. No primary bone lesion or focal pathologic process. Soft tissues and spinal canal: No prevertebral fluid or swelling. No visible canal  hematoma. Disc levels:  Moderate multilevel cervical spondylosis. Upper chest: Included lung apices are clear. Other: Bilateral carotid atherosclerosis. IMPRESSION: 1. No acute intracranial abnormality. 2. No acute fracture or subluxation of the cervical spine. Electronically Signed   By: Duanne Guess D.O.   On: 12/14/2022 14:31   CT Cervical Spine Wo Contrast  Result Date: 12/14/2022 CLINICAL DATA:  Head trauma, minor (Age >= 65y); Neck trauma (Age >= 65y). Syncope EXAM: CT HEAD WITHOUT CONTRAST CT CERVICAL SPINE WITHOUT CONTRAST TECHNIQUE: Multidetector CT imaging of the head and cervical spine was performed following the standard protocol without intravenous contrast. Multiplanar CT image reconstructions of the cervical spine were also generated. RADIATION DOSE REDUCTION: This exam was performed according to the departmental dose-optimization program which includes automated exposure control, adjustment of the mA and/or kV according to patient size and/or use of iterative reconstruction technique. COMPARISON:  07/28/2017 FINDINGS: CT HEAD FINDINGS Brain: No evidence of acute infarction, hemorrhage, hydrocephalus, extra-axial collection or mass lesion/mass effect. Vascular: Atherosclerotic calcifications involving the large vessels of the skull base. No unexpected hyperdense vessel. Skull: Normal. Negative for fracture or focal lesion. Sinuses/Orbits: No acute finding. Other: None. CT CERVICAL SPINE FINDINGS Alignment: Facet joints are aligned without dislocation or traumatic listhesis. Dens and lateral masses are aligned. Reversal of the cervical lordosis. Skull base and vertebrae: No acute fracture. No primary bone lesion or focal pathologic process. Soft tissues and spinal canal: No prevertebral fluid or swelling. No visible canal hematoma. Disc levels:  Moderate multilevel cervical spondylosis. Upper chest: Included lung apices are clear. Other: Bilateral carotid atherosclerosis. IMPRESSION: 1. No  acute intracranial abnormality. 2. No acute fracture or subluxation of the cervical spine. Electronically Signed   By: Duanne Guess D.O.   On: 12/14/2022 14:31    Microbiology: No results found for this or any previous visit (from the past 240 hour(s)).   Labs: Basic Metabolic Panel: Recent Labs  Lab 12/14/22 1346 12/15/22 0331 12/16/22 0736 12/17/22 0525  NA 141 141 145 142  K 3.9 3.5 3.7 3.6  CL 111 114* 116* 113*  CO2 18* 23 21* 23  GLUCOSE 121* 80 88 83  BUN 19 25* 24* 22  CREATININE 1.18* 1.22* 1.01* 1.08*  CALCIUM 8.9 8.7* 8.8* 8.5*   Liver Function Tests: No results for input(s): "AST", "ALT", "ALKPHOS", "BILITOT", "PROT", "ALBUMIN" in the last 168 hours. No results for input(s): "LIPASE", "AMYLASE" in the last 168 hours. No results for input(s): "AMMONIA" in the last 168 hours. CBC: Recent Labs  Lab 12/14/22 1346 12/15/22 0331 12/16/22 0736  WBC 6.1 5.7 5.5  HGB 11.9* 10.6* 10.5*  HCT 37.2 32.6* 31.4*  MCV 94.4 92.6 91.0  PLT 255 260 256   Cardiac Enzymes: No results for input(s): "CKTOTAL", "CKMB", "CKMBINDEX", "TROPONINI" in the last 168 hours. BNP: BNP (last 3 results) No results for input(s): "BNP" in the last 8760 hours.  ProBNP (last 3 results) No results for input(s): "PROBNP" in the last 8760 hours.  CBG: Recent Labs  Lab 12/16/22 0549  GLUCAP 90  Signed:  Silvano Bilis MD.  [facility] 12/17/2022, 4:53 PM

## 2022-12-17 NOTE — H&P (View-Only) (Signed)
   Patient Name: Natalie Knox Date of Encounter: 12/17/2022 [facility] Cardiologist: Yvonne Kendall, MD   Interval Summary  .    Patient seen on a.m. rounds.  Denies any chest pain, shortness of breath, dizziness, lightheadedness or recurrent syncopal episodes.  Heart rates have improved overnight 40s into the 50s and 60s.  She is continue to remain off of beta-blocker therapy.  She is tentatively scheduled for left heart catheterization today.  Vital Signs .    Vitals:   12/16/22 1737 12/16/22 1938 12/16/22 2256 12/17/22 0519  BP: (!) 151/46 (!) 147/51 (!) 134/49 (!) 155/51  Pulse: 64 (!) 59 (!) 57 (!) 57  Resp: 17 18 18 18   Temp: 97.9 F (36.6 C) 97.9 F (36.6 C) 97.6 F (36.4 C) 98 F (36.7 C)  TempSrc: Oral Oral Oral   SpO2: 98% 97% 96% 95%  Weight:      Height:        Intake/Output Summary (Last 24 hours) at 12/17/2022 0800 Last data filed at 12/16/2022 1007 Gross per 24 hour  Intake 3 ml  Output --  Net 3 ml      12/16/2022    3:22 AM 12/14/2022    8:25 PM 12/14/2022    1:46 PM  Last 3 Weights  Weight (lbs) 175 lb 0.7 oz 170 lb 13.7 oz 160 lb  Weight (kg) 79.4 kg 77.5 kg 72.576 kg      Telemetry/ECG    Sinus bradycardia rates 50-68 with occasional unifocal PVC- Personally Reviewed  Physical Exam .   GEN: No acute distress.   Neck: No JVD Cardiac: RRR, II/VI systolic murmur without rubs or gallops.  Respiratory: Clear to auscultation bilaterally. GI: Soft, nontender, non-distended  MS: No edema  Assessment & Plan .     Syncope with collapse -Presented after extended episode of loss of consciousness according to family was approximately 5 minutes -Has remained off of beta-blocker therapy with improvement in heart rates -CT of the head and cervical spine negative for any acute findings -Will need Zio AT monitoring for a minimum of 2 weeks on discharge -Carotid artery duplex revealed mild calcified plaque in the right ICA consistent with less  than 50% stenosis, moderate calcified plaque in the left ICA consistent with 50 to 69% stenosis, progression since previous study  Sinus bradycardia -Rates been 50-60 overnight on telemetry -Continue to avoid AV nodal blocking agents -Continue with telemetry monitoring  Coronary artery disease noted on coronary CTA -Coronary CTA completed 08/28/2022 with a coronary calcium score of 2273 which is 99 percentile for age and sex matched control, heavily calcified plaque in the proximal LAD and RCA causing severe stenosis recommendations for cardiac catheterization -High-sensitivity troponin negative x 2 -Echocardiogram revealed an LVEF greater than 75%, mild to moderate LVH, G1 DD, small pericardial effusion, aortic valve sclerosis without evidence of aortic stenosis -Scheduled for tentative left heart catheterization today -Remains n.p.o.  Hypertension -Blood pressure 148/54 -Continue on amlodipine, HCTZ, lisinopril, and spironolactone -Vital signs per unit protocol  Hyperlipidemia -Continue statin therapy  CKD stage IIIa -Serum creatinine 1.08 -Continue to monitor urine output -Daily BMP -Monitor/trend/replete electrolytes as needed -Avoid nephrotoxic agents were able  History of neurosyphilis with mild cognitive impairment -Continued on Aricept 10 mg nightly -Continue management per IM For questions or updates, please contact [facility] Please consult [email or web address] for contact info under        Signed, Amour Cutrone, NP

## 2022-12-17 NOTE — Plan of Care (Signed)
  Problem: Education: Goal: Knowledge of condition and prescribed therapy will improve Outcome: Adequate for Discharge   Problem: Cardiac: Goal: Will achieve and/or maintain adequate cardiac output Outcome: Adequate for Discharge   Problem: Physical Regulation: Goal: Complications related to the disease process, condition or treatment will be avoided or minimized Outcome: Adequate for Discharge   Problem: Education: Goal: Knowledge of General Education information will improve Description: Including pain rating scale, medication(s)/side effects and non-pharmacologic comfort measures Outcome: Adequate for Discharge   Problem: Health Behavior/Discharge Planning: Goal: Ability to manage health-related needs will improve Outcome: Adequate for Discharge   Problem: Clinical Measurements: Goal: Ability to maintain clinical measurements within normal limits will improve Outcome: Adequate for Discharge Goal: Will remain free from infection Outcome: Adequate for Discharge Goal: Diagnostic test results will improve Outcome: Adequate for Discharge Goal: Respiratory complications will improve Outcome: Adequate for Discharge Goal: Cardiovascular complication will be avoided Outcome: Adequate for Discharge   Problem: Activity: Goal: Risk for activity intolerance will decrease Outcome: Adequate for Discharge   Problem: Nutrition: Goal: Adequate nutrition will be maintained Outcome: Adequate for Discharge   Problem: Coping: Goal: Level of anxiety will decrease Outcome: Adequate for Discharge   Problem: Elimination: Goal: Will not experience complications related to bowel motility Outcome: Adequate for Discharge Goal: Will not experience complications related to urinary retention Outcome: Adequate for Discharge   Problem: Pain Managment: Goal: General experience of comfort will improve Outcome: Adequate for Discharge   Problem: Safety: Goal: Ability to remain free from injury  will improve Outcome: Adequate for Discharge   Problem: Skin Integrity: Goal: Risk for impaired skin integrity will decrease Outcome: Adequate for Discharge   Problem: Education: Goal: Understanding of CV disease, CV risk reduction, and recovery process will improve Outcome: Adequate for Discharge Goal: Individualized Educational Video(s) Outcome: Adequate for Discharge   Problem: Activity: Goal: Ability to return to baseline activity level will improve Outcome: Adequate for Discharge   Problem: Cardiovascular: Goal: Ability to achieve and maintain adequate cardiovascular perfusion will improve Outcome: Adequate for Discharge Goal: Vascular access site(s) Level 0-1 will be maintained Outcome: Adequate for Discharge   Problem: Health Behavior/Discharge Planning: Goal: Ability to safely manage health-related needs after discharge will improve Outcome: Adequate for Discharge

## 2022-12-17 NOTE — Progress Notes (Signed)
Pt discharge w/ family in private vehicle, all belongings packed and in family's possession

## 2022-12-18 ENCOUNTER — Encounter: Payer: Self-pay | Admitting: Internal Medicine

## 2023-01-08 ENCOUNTER — Ambulatory Visit: Payer: 59 | Admitting: Cardiology

## 2023-01-12 NOTE — Progress Notes (Deleted)
Cardiology Office Note Date:  01/12/2023  Patient ID:  Natalie Knox, Natalie Knox Jan 07, 1951, MRN [id number] PCP:  Kandyce Rud, MD  Cardiologist:  Yvonne Kendall, MD Electrophysiologist: None  ***refresh   Chief Complaint: syncope  History of Present Illness: Natalie Knox is a 72 y.o. female with PMH notable for LVH, HTN, IDA, non-obs CAD, PAD, syncope, mild cognitive impairment, neurosyphilis; seen today for post hospital follow up.    She had an echocardiogram completed 11/2018 showing normal LV function, G1 DD, moderate to severe LVH with mild pulmonary hypertension. Stress testing performed in October 2020 showed normal LV function without ischemia or infarct. Coronary artery calcification and aortic atherosclerosis were noted. She had previously been medically managed with beta-blocker, ACE inhibitor, diuretic, and statin therapy. Bilateral carotid bruits were noted on exam carotid ultrasound was performed showing 1 day history and 39% bilateral ICA stenosis. She underwent cardiac MRI on 07/16/2020 which revealed moderate asymmetric LVH with basal septal hypertrophy measuring up to 1.5 cm. Normal LVEF. Normal RV size and function. No delayed hyperenhancement. No significant valvular abnormality was noted.  She last saw Dr. Okey Dupre in clinic 07/2022 where she c/o occasional left upper chest pain and sensation of heart beating harder in her chest. Coronary CTA was recommended with close follow-up scheduled. She did not attend follow-up appointment.   Admitted 9/22-25/2024 after family witnessed AMS for about 30 minutes, then syncopal episode for several minutes, diaphoretic with slurred speech when woke up. During this time she wsa having her hair done and stated that she got hot and passed out. She had a similar episode the week prior while driving. She was bradycardic to 40-50s, BB held. LHC showed mild-mod non-obs CAD. Two week ambulatory monitor ordered at discharge that had 17-beat NSVT.   On  follow-up today,  *** syncope *** syncope while wearing monitor?   Since discharge from hospital the patient reports doing ***.  she denies chest pain, palpitations, dyspnea, PND, orthopnea, nausea, vomiting, dizziness, syncope, edema, weight gain, or early satiety.     AAD History: None   Past Medical History:  Diagnosis Date   Allergic rhinitis    Anemia    Atypical chest pain    a. 05/2009 Ex MV: EF 67%, no ischemia/infarct; b. 12/2018 MV: No ischemia/infarct. Cor Ca2+ and Aortic atherosclerosis noted. EF 55-65%.   CAD (coronary artery disease)    a. 08/2022 Cor CTA: Ca2+ 2273 (99th %'ile), LAD  >70% prox, RCA > 70% prox, LCX 50-65% proximal.   Carotid arterial disease (HCC)    a. 10/2019 Carotid U/S: 1-39% bilat ICA stenosis w/ <50% RECA and CCA stenoses.   Colon polyp    a. 11/2018 Colonscopy: 6mm polyp removed. Otw nl study.   Hard of hearing    has hearing aid   Hyperlipidemia    Hypertension    Iron deficiency anemia    a. 11/2018 s/p 1u prbcs-->EGD w/ erythema in antrum of stomach but otw nl. Colonoscopy notable for 6mm polyp.   LVH (left ventricular hypertrophy)    a. 11/2018 Echo: EF 60-65%, mod to sev LVH w/ near cavity obliteration in systole. Diast dysfxn. Nl RV size/fxn. Mildly elev PASP; b. 06/2020 Cardiac MRI: moderate, asymmetric LVH with basal septal hypertrophy up to 1.5 cm, and normal LV function.  No LGE.   Neurosyphilis    PAD (peripheral artery disease) (HCC)    a. 03/2022 ABI's/Duplex: R 0.58, L 0.51. R iliac 50-74, RCFA 30-49, R SFA 50-74, LCFA 50-74, 75-99  in deep FA, R SFA 50-74.    Past Surgical History:  Procedure Laterality Date   COLONOSCOPY     COLONOSCOPY N/A 12/22/2019   Procedure: COLONOSCOPY;  Surgeon: Pasty Spillers, MD;  Location: [facility] ENDOSCOPY;  Service: Endoscopy;  Laterality: N/A;   COLONOSCOPY WITH PROPOFOL N/A 04/19/2021   Procedure: COLONOSCOPY WITH PROPOFOL;  Surgeon: Toney Reil, MD;  Location: [facility] ENDOSCOPY;  Service:  Gastroenterology;  Laterality: N/A;   ESOPHAGOGASTRODUODENOSCOPY N/A 12/21/2019   Procedure: ESOPHAGOGASTRODUODENOSCOPY (EGD);  Surgeon: Pasty Spillers, MD;  Location: [facility] ENDOSCOPY;  Service: Endoscopy;  Laterality: N/A;   GIVENS CAPSULE STUDY N/A 12/22/2019   Procedure: GIVENS CAPSULE STUDY;  Surgeon: Pasty Spillers, MD;  Location: [facility] ENDOSCOPY;  Service: Endoscopy;  Laterality: N/A;  If colon finds nothing, needs a capsule endoscopy   GIVENS CAPSULE STUDY N/A 02/01/2020   Procedure: GIVENS CAPSULE STUDY;  Surgeon: Pasty Spillers, MD;  Location: [facility] ENDOSCOPY;  Service: Endoscopy;  Laterality: N/A;   LEFT HEART CATH AND CORONARY ANGIOGRAPHY N/A 12/17/2022   Procedure: LEFT HEART CATH AND CORONARY ANGIOGRAPHY;  Surgeon: Yvonne Kendall, MD;  Location: [facility] INVASIVE CV LAB;  Service: Cardiovascular;  Laterality: N/A;   UPPER GI ENDOSCOPY      Current Outpatient Medications  Medication Instructions   amLODipine (NORVASC) 10 mg, Oral, Daily   aspirin EC 81 mg, Oral, Daily, Swallow whole.   atorvastatin (LIPITOR) 40 mg, Oral, Daily   cyanocobalamin (VITAMIN B12) 1,000 mcg, Oral, Daily   donepezil (ARICEPT) 10 mg, Oral, Daily at bedtime   lisinopril (ZESTRIL) 40 mg, Oral, Daily   pantoprazole (PROTONIX) 40 mg, Oral, Daily   spironolactone (ALDACTONE) 50 mg, Oral, Daily    Social History:  The patient  reports that she quit smoking about 5 years ago. Her smoking use included cigarettes. She started smoking about 30 years ago. She has a 12.5 pack-year smoking history. She has never used smokeless tobacco. She reports that she does not currently use alcohol. She reports that she does not use drugs.   Family History:  *** include only if pertinent [name] family history includes Breast cancer in her sister; Heart attack (age of onset: 74) in her mother; Hypertension in her brother.***  ROS:  Please see the history of present illness. All other systems are reviewed and  otherwise negative.   PHYSICAL EXAM: *** VS:  There were no vitals taken for this visit. BMI: There is no height or weight on file to calculate BMI.  GEN- The patient is well appearing, alert and oriented x 3 today.   Lungs- Clear to ausculation bilaterally, normal work of breathing.  Heart- {Blank single:19197::"Regular","Irregularly irregular"} rate and rhythm, no murmurs, rubs or gallops Extremities- {EDEMA LEVEL:28147::"No"} peripheral edema, warm, dry   EKG is ordered. Personal review of EKG from today shows:  ***        Recent Labs: 07/31/2022: ALT 17 12/16/2022: Hemoglobin 10.5; Platelets 256 12/17/2022: BUN 22; Creatinine, Ser 1.08; Potassium 3.6; Sodium 142  07/31/2022: Cholesterol 138; HDL 62; LDL Cholesterol 70; Total CHOL/HDL Ratio 2.2; Triglycerides 31; VLDL 6   CrCl cannot be calculated (Patient's most recent lab result is older than the maximum 21 days allowed.).   Wt Readings from Last 3 Encounters:  12/16/22 175 lb 0.7 oz (79.4 kg)  07/31/22 164 lb (74.4 kg)  12/04/21 159 lb (72.1 kg)     Additional studies reviewed include: Previous EP, cardiology notes.   Ambulatory monitor, 12/17/2022   The patient was  monitored for 14 days.   The predominant rhythm was sinus with an average rate of 65 bpm (range 41-140 bpm in sinus).   There were rare PAC's and PVC's.   A single episode of nonsustained ventricular tachycardia occurred, lasting 17 beats with a maximum rate 146 bpm.   There were 5 supraventricular runs, lasting up to 10 beats with a maximum rate of 146 bpm.   No sustained arrhythmia or prolonged pause was observed.   Patient triggered event corresponds to normal sinus rhythm.  LHC, 12/17/2022 Conclusions: Mild-moderate, nonobstructive coronary artery disease with extensive mural calcification, particularly involving the LAD. Normal left ventricular filling pressure (10 mmHg).  Post a-wave LV pressure is mildly elevated (20 mmHg), suggesting an element of  diastolic dysfunction. Significant blood pressure dropped during LVOT pullback associated with transient junctional rhythm (SBP 167->122 mmHg).  Blood pressure promptly returned to baseline with resumption of normal sinus rhythm.   Recommendations: Medical therapy and risk factor modification to prevent progression of coronary artery disease. Placed 14-day ambulatory cardiac monitor (ZIO AT) for further evaluation of potential arrhythmia. Based on results of event monitor, cardiac MRI +/- electrophysiology consultation will need to be considered at outpatient follow-up. Hold hydrochlorothiazide to minimize risk for intravascular volume depletion.  TTE, 12/16/2022  1. Left ventricular ejection fraction, by estimation, is >75%. The left ventricle has hyperdynamic function. Left ventricular endocardial border not optimally defined to evaluate regional wall motion. There is mild to moderate left ventricular hypertrophy. Left ventricular diastolic parameters are consistent with Grade I diastolic dysfunction (impaired relaxation). Elevated left atrial pressure.   2. Right ventricular systolic function is normal. The right ventricular size is normal. Tricuspid regurgitation signal is inadequate for assessing PA pressure.   3. Left atrial size was mildly dilated.   4. A small pericardial effusion is present. The pericardial effusion is circumferential. There is no evidence of cardiac tamponade.   5. The mitral valve is abnormal. No evidence of mitral valve regurgitation.   6. The aortic valve is tricuspid. There is mild calcification of the aortic valve. There is moderate thickening of the aortic valve. Aortic valve regurgitation is not visualized. Aortic valve sclerosis/calcification is present, without any evidence of aortic stenosis.   7. There is mild dilatation of the ascending aorta, measuring 39 mm.   8. The inferior vena cava is normal in size with greater than 50% respiratory variability, suggesting  right atrial pressure of 3 mmHg.   CTA coronary, 08/28/2022 1. Coronary calcium score of 2273. This was 99th percentile for age and sex matched control.  2. Normal coronary origin with right dominance.  3. Heavily calcified plaque in proximal LAD and RCA causing severe stenosis (>70%).  4. Moderate stenosis in proximal LCx (50-69%).  5. CAD-RADS 4 Severe stenosis. (70-99% or > 50% left main). Cardiac catheterization is recommended.  6. FFR not submitted due to significant motion artifacts.  Cardiac MRI, 07/16/2020 1. Moderate asymmetric LV basal septal hypertrophy, measuring up to 1.45 cm in thickness.  2.  Normal LV and RV size and function, LVEF 64%.  3.  No late gadolinium enhancement or scar noted in the LV.  4.  No evidence for infiltrative myocardial disease.  ASSESSMENT AND PLAN:  #) Syncope #) LVH #) NSVT on recent ambulatory monitor #) bradycardia  2022 Cardiac MRI with asymmetrical LV basal septal hypertrophy with normal LVEF at that time. Updated echo during recent hospitalization with hyperdynamic LVEF Recurrent syncope   #) ***   {Are you ordering  a CV Procedure (e.g. stress test, cath, DCCV, TEE, etc)?   Press F2        :[id number]}   Current medicines are reviewed at length with the patient today.   The patient {ACTIONS; HAS/DOES NOT HAVE:19233} concerns regarding her medicines.  The following changes were made today:  {NONE DEFAULTED:18576}  Labs/ tests ordered today include: *** No orders of the defined types were placed in this encounter.    Disposition: Follow up with {EPMDS:28135} or EP APP {EPFOLLOW UP:28173}   Signed, Sherie Don, NP  01/12/23  8:21 PM  Electrophysiology [facility]

## 2023-01-13 ENCOUNTER — Ambulatory Visit: Payer: 59 | Admitting: Cardiology

## 2023-01-15 ENCOUNTER — Other Ambulatory Visit: Payer: Self-pay | Admitting: Internal Medicine

## 2023-01-15 NOTE — Progress Notes (Signed)
Cardiology Office Note Date:  01/16/2023  Patient ID:  Natalie Knox, Natalie Knox 1950/08/06, MRN [id number] PCP:  Kandyce Rud, MD  Cardiologist:  Yvonne Kendall, MD Electrophysiologist: None     Chief Complaint: syncope follow-up  History of Present Illness: Natalie Knox is a 72 y.o. female with PMH notable for LVH, HTN, HLD, IDA, neurosyphilis, recurrent syncope; seen today for post hospital follow up.    Admitted 9/22-25/2024  She is well-known to Dr. Okey Dupre in the outpatient setting. Routine TTE in 2020 showed mod-severe LVH with normal LVEF. EKG showed low-voltage, and subsequent cMRI 06/2020 showed moderate asymmetric LVH w basal septal hypertrophy up to 1.5cm. No LGE, normal LVEF. She was last seen by Dr. Okey Dupre 07/2022 where she c/o L upper chest pain with sensation of heart beating hard in chest. Coronary CTA showed heavily calcified LAD and RCA with elevated coronary calcium score. She did not follow-up as requested. Presented to [facility]14[facility]2022 with witnessed AMS for 30 min followed by syncope. She had a prior syncopal episode the week before. Her BB was held. Ischemic workup completed. She was discharged with a 14d zio for further eval for arrhythmia.   On follow-up today, she has had no further syncopal or presyncopal episodes. She feels well at her baseline.   Family member joins for visit.    she denies chest pain, palpitations, dyspnea, PND, orthopnea, nausea, vomiting, dizziness, syncope, edema, weight gain, or early satiety.     Device Information: none  AAD History: none  Past Medical History:  Diagnosis Date   Allergic rhinitis    Anemia    Atypical chest pain    a. 05/2009 Ex MV: EF 67%, no ischemia/infarct; b. 12/2018 MV: No ischemia/infarct. Cor Ca2+ and Aortic atherosclerosis noted. EF 55-65%.   CAD (coronary artery disease)    a. 08/2022 Cor CTA: Ca2+ 2273 (99th %'ile), LAD  >70% prox, RCA > 70% prox, LCX 50-65% proximal.   Carotid arterial disease (HCC)    a.  10/2019 Carotid U/S: 1-39% bilat ICA stenosis w/ <50% RECA and CCA stenoses.   Colon polyp    a. 11/2018 Colonscopy: 6mm polyp removed. Otw nl study.   Hard of hearing    has hearing aid   Hyperlipidemia    Hypertension    Iron deficiency anemia    a. 11/2018 s/p 1u prbcs-->EGD w/ erythema in antrum of stomach but otw nl. Colonoscopy notable for 6mm polyp.   LVH (left ventricular hypertrophy)    a. 11/2018 Echo: EF 60-65%, mod to sev LVH w/ near cavity obliteration in systole. Diast dysfxn. Nl RV size/fxn. Mildly elev PASP; b. 06/2020 Cardiac MRI: moderate, asymmetric LVH with basal septal hypertrophy up to 1.5 cm, and normal LV function.  No LGE.   Neurosyphilis    PAD (peripheral artery disease) (HCC)    a. 03/2022 ABI's/Duplex: R 0.58, L 0.51. R iliac 50-74, RCFA 30-49, R SFA 50-74, LCFA 50-74, 75-99 in deep FA, R SFA 50-74.    Past Surgical History:  Procedure Laterality Date   COLONOSCOPY     COLONOSCOPY N/A 12/22/2019   Procedure: COLONOSCOPY;  Surgeon: Pasty Spillers, MD;  Location: [facility] ENDOSCOPY;  Service: Endoscopy;  Laterality: N/A;   COLONOSCOPY WITH PROPOFOL N/A 04/19/2021   Procedure: COLONOSCOPY WITH PROPOFOL;  Surgeon: Toney Reil, MD;  Location: [facility] ENDOSCOPY;  Service: Gastroenterology;  Laterality: N/A;   ESOPHAGOGASTRODUODENOSCOPY N/A 12/21/2019   Procedure: ESOPHAGOGASTRODUODENOSCOPY (EGD);  Surgeon: Pasty Spillers, MD;  Location: [facility] ENDOSCOPY;  Service: Endoscopy;  Laterality: N/A;   GIVENS CAPSULE STUDY N/A 12/22/2019   Procedure: GIVENS CAPSULE STUDY;  Surgeon: Pasty Spillers, MD;  Location: [facility] ENDOSCOPY;  Service: Endoscopy;  Laterality: N/A;  If colon finds nothing, needs a capsule endoscopy   GIVENS CAPSULE STUDY N/A 02/01/2020   Procedure: GIVENS CAPSULE STUDY;  Surgeon: Pasty Spillers, MD;  Location: [facility] ENDOSCOPY;  Service: Endoscopy;  Laterality: N/A;   LEFT HEART CATH AND CORONARY ANGIOGRAPHY N/A 12/17/2022   Procedure: LEFT  HEART CATH AND CORONARY ANGIOGRAPHY;  Surgeon: Yvonne Kendall, MD;  Location: [facility] INVASIVE CV LAB;  Service: Cardiovascular;  Laterality: N/A;   UPPER GI ENDOSCOPY      Current Outpatient Medications  Medication Instructions   amLODipine (NORVASC) 10 mg, Oral, Daily   aspirin EC 81 mg, Oral, Daily, Swallow whole.   atorvastatin (LIPITOR) 40 mg, Oral, Daily   cyanocobalamin (VITAMIN B12) 1,000 mcg, Oral, Daily   donepezil (ARICEPT) 10 mg, Oral, Daily at bedtime   lisinopril (ZESTRIL) 40 mg, Oral, Daily   pantoprazole (PROTONIX) 40 mg, Oral, Daily   spironolactone (ALDACTONE) 50 mg, Oral, Daily   spironolactone (ALDACTONE) 50 mg, Oral, Daily    Social History:  The patient  reports that she quit smoking about 5 years ago. Her smoking use included cigarettes. She started smoking about 30 years ago. She has a 12.5 pack-year smoking history. She has never used smokeless tobacco. She reports that she does not currently use alcohol. She reports that she does not use drugs.   Family History:  [name] family history includes Breast cancer in her sister; Heart attack (age of onset: 34) in her mother; Hypertension in her brother.  ROS:  Please see the history of present illness. All other systems are reviewed and otherwise negative.   PHYSICAL EXAM:  VS:  BP (!) 138/52 (BP Location: Left Arm, Patient Position: Sitting, Cuff Size: Normal)   Pulse (!) 59   Ht 5\' 6"  (1.676 m)   Wt 170 lb 3.2 oz (77.2 kg)   SpO2 98%   BMI 27.47 kg/m  BMI: Body mass index is 27.47 kg/m.  GEN- The patient is well appearing, alert and oriented x 3 today.   Lungs- Clear to ausculation bilaterally, normal work of breathing.  Heart- Regular rate and rhythm, no murmurs, rubs or gallops Extremities- No peripheral edema, warm, dry   EKG is ordered. Personal review of EKG from today shows:    EKG Interpretation Date/Time:  Friday January 16 2023 14:51:55 EDT Ventricular Rate:  59 PR Interval:  174 QRS  Duration:  88 QT Interval:  432 QTC Calculation: 427 R Axis:   18  Text Interpretation: Sinus bradycardia Possible Left atrial enlargement Confirmed by Sherie Don [phone]) on 01/16/2023 3:08:25 PM    Recent Labs: 07/31/2022: ALT 17 12/16/2022: Hemoglobin 10.5; Platelets 256 12/17/2022: BUN 22; Creatinine, Ser 1.08; Potassium 3.6; Sodium 142  07/31/2022: Cholesterol 138; HDL 62; LDL Cholesterol 70; Total CHOL/HDL Ratio 2.2; Triglycerides 31; VLDL 6   CrCl cannot be calculated (Patient's most recent lab result is older than the maximum 21 days allowed.).   Wt Readings from Last 3 Encounters:  01/16/23 170 lb 3.2 oz (77.2 kg)  12/16/22 175 lb 0.7 oz (79.4 kg)  07/31/22 164 lb (74.4 kg)     Additional studies reviewed include: Previous EP, cardiology notes.   Long term monitor, 01/09/2023   The patient was monitored for 14 days.   The predominant rhythm was sinus with an  average rate of 65 bpm (range 41-140 bpm in sinus).   There were rare PAC's and PVC's.   A single episode of nonsustained ventricular tachycardia occurred, lasting 17 beats with a maximum rate 146 bpm.   There were 5 supraventricular runs, lasting up to 10 beats with a maximum rate of 146 bpm.   No sustained arrhythmia or prolonged pause was observed.   Patient triggered event corresponds to normal sinus rhythm.   Predominantly sinus rhythm with rare PAC's and PVC's.  Single episode of NSVT was noted, as well as a few runs of PSVT (see details above).  LHC, 12/17/2022 Conclusions: Mild-moderate, nonobstructive coronary artery disease with extensive mural calcification, particularly involving the LAD. Normal left ventricular filling pressure (10 mmHg).  Post a-wave LV pressure is mildly elevated (20 mmHg), suggesting an element of diastolic dysfunction. Significant blood pressure dropped during LVOT pullback associated with transient junctional rhythm (SBP 167->122 mmHg).  Blood pressure promptly returned to baseline  with resumption of normal sinus rhythm.   Recommendations: Medical therapy and risk factor modification to prevent progression of coronary artery disease. Placed 14-day ambulatory cardiac monitor (ZIO AT) for further evaluation of potential arrhythmia. Based on results of event monitor, cardiac MRI +/- electrophysiology consultation will need to be considered at outpatient follow-up. Hold hydrochlorothiazide to minimize risk for intravascular volume depletion.  TTE, 12/15/2022  1. Left ventricular ejection fraction, by estimation, is >75%. The left ventricle has hyperdynamic function. Left ventricular endocardial border not optimally defined to evaluate regional wall motion. There is mild to moderate left ventricular hypertrophy. Left ventricular diastolic parameters are consistent with Grade I diastolic dysfunction (impaired relaxation). Elevated left atrial pressure.   2. Right ventricular systolic function is normal. The right ventricular size is normal. Tricuspid regurgitation signal is inadequate for assessing PA pressure.   3. Left atrial size was mildly dilated.   4. A small pericardial effusion is present. The pericardial effusion is circumferential. There is no evidence of cardiac tamponade.   5. The mitral valve is abnormal. No evidence of mitral valve regurgitation.   6. The aortic valve is tricuspid. There is mild calcification of the aortic valve. There is moderate thickening of the aortic valve. Aortic valve regurgitation is not visualized. Aortic valve sclerosis/calcification is present, without any evidence of aortic stenosis.   7. There is mild dilatation of the ascending aorta, measuring 39 mm.    8. The inferior vena cava is normal in size with greater than 50% respiratory variability, suggesting right atrial pressure of 3 mmHg.   Cardiac MRI, 07/16/2020 1. Moderate asymmetric LV basal septal hypertrophy, measuring up to 1.45 cm in thickness.  2.  Normal LV and RV size and  function, LVEF 64%.  3.  No late gadolinium enhancement or scar noted in the LV.  4.  No evidence for infiltrative myocardial disease.  ASSESSMENT AND PLAN:  #) recurrent syncope Unclear cause of recurrent syncope No syncope while wearing ambulatory monitor Recommended ILR to further eval for arrhythmia long term Discussed risks/benefits of procedure including ongoing monthly fee Patient verbalized understanding and wished to proceed Advised that if she syncopizes again prior to ILR implant, to proceed to ER for evaluation  Will msg pre-cert to confirm insurance auth and schedule ILR implant appt with MD      Current medicines are reviewed at length with the patient today.   The patient does not have concerns regarding her medicines.  The following changes were made today:  none  Labs/ tests  ordered today include:  Orders Placed This Encounter  Procedures   EKG 12-Lead     Disposition: Follow up with Dr. Graciela Husbands or Dr. Lalla Brothers for ILR implant in 2-4 weeks.     Signed, Sherie Don, NP  01/16/23  3:08 PM  Electrophysiology [facility]

## 2023-01-15 NOTE — Telephone Encounter (Signed)
Please advise on dose/refill.  Request is for 1 QD but hospital dc with 2 tabs QD.   Last office vist on 07/31/22.  Hospital dc 12/17/22 with cath on 12/17/22.  Appt with EP scheduled but not Dr. Okey Dupre.

## 2023-01-15 NOTE — Telephone Encounter (Signed)
According to the patient's PCP note on December 23, 2022, the patient's Spironolactone dosage was increased to 50 mg during the recent hospital admission.  Will forward to MD for refill approval.

## 2023-01-16 ENCOUNTER — Encounter: Payer: Self-pay | Admitting: Cardiology

## 2023-01-16 ENCOUNTER — Ambulatory Visit: Payer: 59 | Attending: Cardiology | Admitting: Cardiology

## 2023-01-16 VITALS — BP 138/52 | HR 59 | Ht 66.0 in | Wt 170.2 lb

## 2023-01-16 DIAGNOSIS — R55 Syncope and collapse: Secondary | ICD-10-CM | POA: Diagnosis not present

## 2023-01-16 NOTE — Telephone Encounter (Signed)
She should be on 50 mg daily as documented in the chart.  However, it does not appear that kidney function or potassium have been rechecked since she left the hospital.  Please have Ms. Fadley get a BMP to confirm that it is appropriate to continue spironolactone 50 mg daily.  Refill can be sent in at that time.  Thank you.  Yvonne Kendall, MD [facility]

## 2023-01-16 NOTE — Patient Instructions (Addendum)
Medication Instructions:  The current medical regimen is effective;  continue present plan and medications.  *If you need a refill on your cardiac medications before your next appointment, please call your pharmacy*   Follow-Up: At [facility], you and your health needs are our priority.  As part of our continuing mission to provide you with exceptional heart care, we have created designated Provider Care Teams.  These Care Teams include your primary Cardiologist (physician) and Advanced Practice Providers (APPs -  Physician Assistants and Nurse Practitioners) who all work together to provide you with the care you need, when you need it.  We recommend signing up for the patient portal called "MyChart".  Sign up information is provided on this After Visit Summary.  MyChart is used to connect with patients for Virtual Visits (Teleme dicine).  Patients are able to view lab/test results, encounter notes, upcoming appointments, etc.  Non-urgent messages can be sent to your provider as well.   To learn more about what you can do with MyChart, go to [email or web address].    Your next appointment:   November 11th, 2024  Provider:   Sherryl Manges, MD    Other Instruction  Implantable Loop Recorder Placement, Care After This sheet gives you information about how to care for yourself after your procedure. Your health care provider may also give you more specific instructions. If you have problems or questions, contact your health care provider. What can I expect after the procedure? After the procedure, it is common to have: Soreness or discomfort near the incision. Some swelling or bruising near the incision.  Follow these instructions at home: Incision care  Monitor your cardiac device site for redness, swelling, and drainage. Call the device clinic at [phone] if you experience these symptoms or fever/chills.  Keep the large square bandage on your site for 24 hours and then  you may remove it yourself. Keep the steri-strips underneath in place.   You may shower after 72 hours / 3 days from your procedure with the steri-strips in place. They will usually fall off on their own, or may be removed after 10 days. Pat dry.   Avoid lotions, ointments, or perfumes over your incision until it is well-healed.  Please do not submerge in water until your site is completely healed.   Your device is MRI compatible.   Remote monitoring is used to monitor your cardiac device from home. This monitoring is scheduled every month by our office. It allows [place] to keep an eye on the function of your device to ensure it is working properly.  If your wound site starts to bleed apply pressure.    For help with the monitor please call [employer] Monitor Support Specialist directly at [phone].    If you have any questions/concerns please call the device clinic at [phone].  Activity  Return to your normal activities.  General instructions Follow instructions from your health care provider about how to manage your implantable loop recorder and transmit the information. Learn how to activate a recording if this is necessary for your type of device. You may go through a metal detection gate, and you may let someone hold a metal detector over your chest. Show your ID card if needed. Do not have an MRI unless you check with your health care provider first. Take over-the-counter and prescription medicines only as told by your health care provider. Keep all follow-up visits as told by your health care provider. This is important. Contact a health  care provider if: You have redness, swelling, or pain around your incision. You have a fever. You have pain that is not relieved by your pain medicine. You have triggered your device because of fainting (syncope) or because of a heartbeat that feels like it is racing, slow, fluttering, or skipping (palpitations). Get help right away if you  have: Chest pain. Difficulty breathing. Summary After the procedure, it is common to have soreness or discomfort near the incision. Change your dressing as told by your health care provider. Follow instructions from your health care provider about how to manage your implantable loop recorder and transmit the information. Keep all follow-up visits as told by your health care provider. This is important. This information is not intended to replace advice given to you by your health care provider. Make sure you discuss any questions you have with your health care provider. Document Released: 02/19/2015 Document Revised: 04/25/2017 Document Reviewed: 04/25/2017 Elsevier Patient Education  2020 [employer].

## 2023-01-16 NOTE — Telephone Encounter (Signed)
Attempted to call the patient. The line was not in service. Attempted to call the second number. Was unable to leave a message

## 2023-01-19 ENCOUNTER — Other Ambulatory Visit: Payer: Self-pay

## 2023-01-19 DIAGNOSIS — Z79899 Other long term (current) drug therapy: Secondary | ICD-10-CM

## 2023-01-19 NOTE — Telephone Encounter (Signed)
Attempted to contact the patient. Received a message stating that the line is no longer in service. The nurse contacted the patient's daughter and informed her of the MD's recommendations. [name] daughter verbalized her understanding.

## 2023-01-20 ENCOUNTER — Other Ambulatory Visit: Payer: Self-pay

## 2023-01-20 DIAGNOSIS — Z79899 Other long term (current) drug therapy: Secondary | ICD-10-CM

## 2023-01-21 LAB — BASIC METABOLIC PANEL
BUN/Creatinine Ratio: 13 (ref 12–28)
BUN: 12 mg/dL (ref 8–27)
CO2: 20 mmol/L (ref 20–29)
Calcium: 9.6 mg/dL (ref 8.7–10.3)
Chloride: 109 mmol/L — ABNORMAL HIGH (ref 96–106)
Creatinine, Ser: 0.9 mg/dL (ref 0.57–1.00)
Glucose: 79 mg/dL (ref 70–99)
Potassium: 4.3 mmol/L (ref 3.5–5.2)
Sodium: 144 mmol/L (ref 134–144)
eGFR: 67 mL/min/{1.73_m2} (ref >59)

## 2023-01-22 NOTE — Telephone Encounter (Signed)
Per Dr. Okey Dupre, ok to send refill for Spironolactone 50 mg by mouth daily. Refill sent to requested pharmacy

## 2023-01-29 ENCOUNTER — Encounter: Payer: Self-pay | Admitting: Oncology

## 2023-02-02 ENCOUNTER — Ambulatory Visit: Payer: 59 | Attending: Internal Medicine | Admitting: Internal Medicine

## 2023-02-02 ENCOUNTER — Encounter: Payer: Self-pay | Admitting: Internal Medicine

## 2023-02-02 VITALS — BP 120/59 | HR 58 | Ht 64.0 in | Wt 171.6 lb

## 2023-02-02 DIAGNOSIS — R55 Syncope and collapse: Secondary | ICD-10-CM

## 2023-02-02 DIAGNOSIS — R0789 Other chest pain: Secondary | ICD-10-CM | POA: Diagnosis not present

## 2023-02-02 DIAGNOSIS — R001 Bradycardia, unspecified: Secondary | ICD-10-CM | POA: Diagnosis not present

## 2023-02-02 DIAGNOSIS — I739 Peripheral vascular disease, unspecified: Secondary | ICD-10-CM

## 2023-02-02 DIAGNOSIS — I517 Cardiomegaly: Secondary | ICD-10-CM | POA: Diagnosis not present

## 2023-02-02 DIAGNOSIS — I700 Atherosclerosis of aorta: Secondary | ICD-10-CM

## 2023-02-02 DIAGNOSIS — R072 Precordial pain: Secondary | ICD-10-CM
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
## 2023-02-02 NOTE — Patient Instructions (Signed)
Medication Instructions:  The current medical regimen is effective;  continue present plan and medications.  *If you need a refill on your cardiac medications before your next appointment, please call your pharmacy*   Follow-Up: At [facility], you and your health needs are our priority.  As part of our continuing mission to provide you with exceptional heart care, we have created designated Provider Care Teams.  These Care Teams include your primary Cardiologist (physician) and Advanced Practice Providers (APPs -  Physician Assistants and Nurse Practitioners) who all work together to provide you with the care you need, when you need it.  We recommend signing up for the patient portal called "MyChart".  Sign up information is provided on this After Visit Summary.  MyChart is used to connect with patients for Virtual Visits (Telemedicine).  Patients are able to view lab/test results, encounter notes, upcoming appointments, etc.  Non-urgent messages can be sent to your provider as well.   To learn more about what you can do with MyChart, go to [email or web address].    Your next appointment:   AS NEEDED  Provider:   Sherryl Manges, MD   Other Instructions Medication Instructions:  Your physician recommends that you continue on your current medications as directed. Please refer to the Current Medication list given to you today.  Labwork: None ordered.  Testing/Procedures: None ordered.  Follow-Up:  Your physician wants you to follow-up in: one year with _____________________.  You will receive a reminder letter in the mail two months in advance. If you don't receive a letter, please call our office to schedule the follow-up appointment.    Implantable Loop Recorder Placement, Care After This sheet gives you information about how to care for yourself after your procedure. Your health care provider may also give you more specific instructions. If you have problems or  questions, contact your health care provider. What can I expect after the procedure? After the procedure, it is common to have: Soreness or discomfort near the incision. Some swelling or bruising near the incision.  Follow these instructions at home: Incision care  Monitor your cardiac device site for redness, swelling, and drainage. Call the device clinic at [phone] if you experience these symptoms or fever/chills.  Keep the large square bandage on your site for 24 hours and then you may remove it yourself. Keep the steri-strips underneath in place.   You may shower after 72 hours / 3 days from your procedure with the steri-strips in place. They will usually fall off on their own, or may be removed after 10 days. Pat dry.   Avoid lotions, ointments, or perfumes over your incision until it is well-healed.  Please do not submerge in water until your site is completely healed.   Your device is MRI compatible.   Remote monitoring is used to monitor your cardiac device from home. This monitoring is scheduled every month by our office. It allows [place] to keep an eye on the function of your device to ensure it is working properly.  If your wound site starts to bleed apply pressure.    For help with the monitor please call [employer] Monitor Support Specialist directly at [phone].    If you have any questions/concerns please call the device clinic at [phone].  Activity  Return to your normal activities.  General instructions Follow instructions from your health care provider about how to manage your implantable loop recorder and transmit the information. Learn how to activate a recording if this is  necessary for your type of device. You may go through a metal detection gate, and you may let someone hold a metal detector over your chest. Show your ID card if needed. Do not have an MRI unless you check with your health care provider first. Take over-the-counter and prescription  medicines only as told by your health care provider. Keep all follow-up visits as told by your health care provider. This is important. Contact a health care provider if: You have redness, swelling, or pain around your incision. You have a fever. You have pain that is not relieved by your pain medicine. You have triggered your device because of fainting (syncope) or because of a heartbeat that feels like it is racing, slow, fluttering, or skipping (palpitations). Get help right away if you have: Chest pain. Difficulty breathing. Summary After the procedure, it is common to have soreness or discomfort near the incision. Change your dressing as told by your health care provider. Follow instructions from your health care provider about how to manage your implantable loop recorder and transmit the information. Keep all follow-up visits as told by your health care provider. This is important. This information is not intended to replace advice given to you by your health care provider. Make sure you discuss any questions you have with your health care provider. Document Released: 02/19/2015 Document Revised: 04/25/2017 Document Reviewed: 04/25/2017 Elsevier Patient Education  2020 [employer].

## 2023-03-10 ENCOUNTER — Ambulatory Visit (INDEPENDENT_AMBULATORY_CARE_PROVIDER_SITE_OTHER): Payer: 59

## 2023-03-10 DIAGNOSIS — R55 Syncope and collapse: Secondary | ICD-10-CM | POA: Diagnosis not present

## 2023-03-11 LAB — CUP PACEART REMOTE DEVICE CHECK
Date Time Interrogation Session: 20241217133914
Implantable Pulse Generator Implant Date: 20241111

## 2023-04-14 ENCOUNTER — Ambulatory Visit (INDEPENDENT_AMBULATORY_CARE_PROVIDER_SITE_OTHER): Payer: 59

## 2023-04-14 DIAGNOSIS — R55 Syncope and collapse: Secondary | ICD-10-CM

## 2023-04-15 LAB — CUP PACEART REMOTE DEVICE CHECK
Date Time Interrogation Session: 20250121133909
Implantable Pulse Generator Implant Date: 20241111

## 2023-04-17 NOTE — Addendum Note (Signed)
Addended by: Geralyn Flash D on: 04/17/2023 12:47 PM   Modules accepted: Orders

## 2023-04-17 NOTE — Progress Notes (Signed)
Carelink Summary Report / Loop Recorder

## 2023-05-19 ENCOUNTER — Ambulatory Visit (INDEPENDENT_AMBULATORY_CARE_PROVIDER_SITE_OTHER): Payer: 59

## 2023-05-19 DIAGNOSIS — R55 Syncope and collapse: Secondary | ICD-10-CM

## 2023-05-20 ENCOUNTER — Encounter: Payer: Self-pay | Admitting: Internal Medicine

## 2023-05-20 LAB — CUP PACEART REMOTE DEVICE CHECK
Date Time Interrogation Session: 20250225133911
Implantable Pulse Generator Implant Date: 20241111

## 2023-05-26 NOTE — Progress Notes (Signed)
 Carelink Summary Report / Loop Recorder

## 2023-05-26 NOTE — Addendum Note (Signed)
 Addended by: Geralyn Flash D on: 05/26/2023 04:09 PM   Modules accepted: Orders

## 2023-06-09 ENCOUNTER — Encounter: Payer: Self-pay | Admitting: Internal Medicine

## 2023-06-23 ENCOUNTER — Ambulatory Visit (INDEPENDENT_AMBULATORY_CARE_PROVIDER_SITE_OTHER): Payer: 59

## 2023-06-23 DIAGNOSIS — R55 Syncope and collapse: Secondary | ICD-10-CM | POA: Diagnosis not present

## 2023-06-24 LAB — CUP PACEART REMOTE DEVICE CHECK
Date Time Interrogation Session: 20250401133913
Implantable Pulse Generator Implant Date: 20241111

## 2023-06-24 NOTE — Addendum Note (Signed)
 Addended by: Elease Etienne A on: 06/24/2023 02:44 PM   Modules accepted: Orders

## 2023-06-24 NOTE — Progress Notes (Signed)
 Carelink Summary Report / Loop Recorder

## 2023-07-11 ENCOUNTER — Encounter: Payer: Self-pay | Admitting: Internal Medicine

## 2023-07-28 ENCOUNTER — Ambulatory Visit (INDEPENDENT_AMBULATORY_CARE_PROVIDER_SITE_OTHER): Payer: 59

## 2023-07-28 DIAGNOSIS — R55 Syncope and collapse: Secondary | ICD-10-CM

## 2023-07-29 LAB — CUP PACEART REMOTE DEVICE CHECK
Date Time Interrogation Session: 20250506133913
Implantable Pulse Generator Implant Date: 20241111

## 2023-08-05 ENCOUNTER — Ambulatory Visit: Payer: 59 | Attending: Internal Medicine | Admitting: Internal Medicine

## 2023-08-05 ENCOUNTER — Encounter: Payer: Self-pay | Admitting: Internal Medicine

## 2023-08-05 VITALS — BP 130/52 | HR 58 | Ht 64.0 in | Wt 179.0 lb

## 2023-08-05 DIAGNOSIS — R9431 Abnormal electrocardiogram [ECG] [EKG]: Secondary | ICD-10-CM

## 2023-08-05 DIAGNOSIS — R001 Bradycardia, unspecified: Secondary | ICD-10-CM

## 2023-08-05 DIAGNOSIS — I739 Peripheral vascular disease, unspecified: Secondary | ICD-10-CM | POA: Diagnosis not present

## 2023-08-05 DIAGNOSIS — I10 Essential (primary) hypertension: Secondary | ICD-10-CM

## 2023-08-05 DIAGNOSIS — I2510 Atherosclerotic heart disease of native coronary artery without angina pectoris: Secondary | ICD-10-CM

## 2023-08-05 DIAGNOSIS — I700 Atherosclerosis of aorta: Secondary | ICD-10-CM

## 2023-08-05 DIAGNOSIS — I25118 Atherosclerotic heart disease of native coronary artery with other forms of angina pectoris: Secondary | ICD-10-CM

## 2023-08-05 DIAGNOSIS — E785 Hyperlipidemia, unspecified: Secondary | ICD-10-CM

## 2023-08-05 NOTE — Progress Notes (Unsigned)
  Cardiology Office Note:  .   Date:  08/05/2023  ID:  Natalie Knox, DOB 22-Jun-1950, MRN [id number] PCP: Nestor Banter, MD  [facility] Providers Cardiologist:  Sammy Crisp, MD { Click to update primary MD,subspecialty MD or APP then REFRESH:1}    History of Present Illness: .   Natalie Knox is a 73 y.o. female with history of hypertension, left ventricular hypertrophy, hyperlipidemia, iron  deficiency anemia, and neurosyphilis, who presents for follow-up of hypertension and syncope.  I last saw her a year ago, at which time she complained of sporadic left upper chest pain as well as palpitations.  We elected to reduce her carvedilol  due to sinus bradycardia and resume amlodipine  that she had previously run out of.  We also agreed to perform a coronary CTA which showed severe LAD and RCA disease with heavy calcification.  Subsequent left heart catheterization demonstrated only mild to moderate, nonobstructive CAD with extensive mural calcification noted.  She was subsequently hospitalized in 11/2022 for a syncopal episode.  Subsequent event monitor showed a 17 beat run of NSVT as well as a few episodes of PSVT.  She was referred to EP and underwent ILR implantation.  Occ orthostatic LH.  No CP, SOB, palpitations.  Occ swelling and tightness in legs.  Get tired quickly.  Some claudication walking around entire block, about 1/2 mi.  Present for over a year but not walking as much right now.  ROS: See HPI  Studies Reviewed: Aaron Aas   EKG Interpretation Date/Time:  Wednesday Aug 05 2023 16:03:42 EDT Ventricular Rate:  58 PR Interval:  166 QRS Duration:  84 QT Interval:  436 QTC Calculation: 428 R Axis:   24  Text Interpretation: Sinus bradycardia Possible Left atrial enlargement T wave abnormality, consider lateral ischemia Abnormal ECG When compared with ECG of 02-Feb-2023 09:05, Premature ventricular complexes are no longer Present Left posterior fascicular block is no longer Present  Confirmed by Rowin Bayron, Veryl Gottron [phone]) on 08/05/2023 4:08:29 PM    *** Risk Assessment/Calculations:   {Does this patient have ATRIAL FIBRILLATION?:[phone]}         Physical Exam:   VS:  BP (!) 130/52 (BP Location: Left Arm, Patient Position: Sitting, Cuff Size: Normal)   Pulse (!) 58   Ht 5\' 4"  (1.626 m)   Wt 179 lb (81.2 kg)   SpO2 98%   BMI 30.73 kg/m    Wt Readings from Last 3 Encounters:  08/05/23 179 lb (81.2 kg)  02/02/23 171 lb 9.6 oz (77.8 kg)  01/16/23 170 lb 3.2 oz (77.2 kg)    General:  NAD. Neck: No JVD or HJR. Lungs: Clear to auscultation bilaterally without wheezes or crackles. Heart: Regular rate and rhythm without murmurs, rubs, or gallops. Abdomen: Soft, nontender, nondistended. Extremities: No lower extremity edema.  ASSESSMENT AND PLAN: .    ***    {Are you ordering a CV Procedure (e.g. stress test, cath, DCCV, TEE, etc)?   Press F2        :[id number]}  Dispo: ***  Signed, Sammy Crisp, MD

## 2023-08-05 NOTE — Patient Instructions (Signed)
 Medication Instructions:  Your physician recommends that you continue on your current medications as directed. Please refer to the Current Medication list given to you today.   *If you need a refill on your cardiac medications before your next appointment, please call your pharmacy*  Lab Work: No labs ordered today   Testing/Procedures: Your physician has requested that you have an ankle brachial index (ABI). During this test an ultrasound and blood pressure cuff are used to evaluate the arteries that supply the arms and legs with blood.  Allow thirty minutes for this exam.  There are no restrictions or special instructions.  This will take place at [address] [facility] [address]) [address], [place] [address]  Please note: We ask at that you not bring children with you during ultrasound (echo/ vascular) testing. Due to room size and safety concerns, children are not allowed in the ultrasound rooms during exams. Our front office staff cannot provide observation of children in our lobby area while testing is being conducted. An adult accompanying a patient to their appointment will only be allowed in the ultrasound room at the discretion of the ultrasound technician under special circumstances. We apologize for any inconvenience.   Your physician has requested that you have a lower extremity arterial duplex. During this test, ultrasound is used to evaluate arterial blood flow in the legs. Allow one hour for this exam. There are no restrictions or special instructions. This will take place at [address] [facility] [address]) [address], [place] [address]  Please note: We ask at that you not bring children with you during ultrasound (echo/ vascular) testing. Due to room size and safety concerns, children are not allowed in the ultrasound rooms during exams. Our front office staff cannot provide observation of children in our lobby area while testing is being conducted. An adult accompanying  a patient to their appointment will only be allowed in the ultrasound room at the discretion of the ultrasound technician under special circumstances. We apologize for any inconvenience.   Follow-Up: At [facility], you and your health needs are our priority.  As part of our continuing mission to provide you with exceptional heart care, our providers are all part of one team.  This team includes your primary Cardiologist (physician) and Advanced Practice Providers or APPs (Physician Assistants and Nurse Practitioners) who all work together to provide you with the care you need, when you need it.  Your next appointment:   3 month(s)  Provider:   You may see Sammy Crisp, MD or one of the following Advanced Practice Providers on your designated Care Team:   Laneta Pintos, NP Gildardo Labrador, PA-C Varney Gentleman, PA-C Cadence Gazelle, PA-C Ronald Cockayne, NP Morey Ar, NP    We recommend signing up for the patient portal called "MyChart".  Sign up information is provided on this After Visit Summary.  MyChart is used to connect with patients for Virtual Visits (Telemedicine).  Patients are able to view lab/test results, encounter notes, upcoming appointments, etc.  Non-urgent messages can be sent to your provider as well.   To learn more about what you can do with MyChart, go to [email or web address].

## 2023-08-06 ENCOUNTER — Encounter: Payer: Self-pay | Admitting: Internal Medicine

## 2023-08-06 NOTE — Addendum Note (Signed)
 Addended by: Lott Rouleau A on: 08/06/2023 10:19 AM   Modules accepted: Orders

## 2023-08-06 NOTE — Progress Notes (Signed)
 Carelink Summary Report / Loop Recorder

## 2023-08-31 ENCOUNTER — Ambulatory Visit (INDEPENDENT_AMBULATORY_CARE_PROVIDER_SITE_OTHER)

## 2023-08-31 ENCOUNTER — Ambulatory Visit: Attending: Internal Medicine

## 2023-08-31 DIAGNOSIS — I739 Peripheral vascular disease, unspecified: Secondary | ICD-10-CM

## 2023-08-31 LAB — CUP PACEART REMOTE DEVICE CHECK
Date Time Interrogation Session: 20250606133915
Implantable Pulse Generator Implant Date: 20241111

## 2023-08-31 LAB — VAS US ABI WITH/WO TBI
Left ABI: 0.44
Right ABI: 0.58

## 2023-09-01 ENCOUNTER — Ambulatory Visit (INDEPENDENT_AMBULATORY_CARE_PROVIDER_SITE_OTHER): Payer: 59

## 2023-09-01 DIAGNOSIS — R001 Bradycardia, unspecified: Secondary | ICD-10-CM | POA: Diagnosis not present

## 2023-09-02 ENCOUNTER — Ambulatory Visit: Payer: Self-pay | Admitting: Internal Medicine

## 2023-09-02 NOTE — Telephone Encounter (Signed)
 Pt is returning call.

## 2023-09-09 NOTE — Addendum Note (Signed)
 Addended by: Lott Rouleau A on: 09/09/2023 02:14 PM   Modules accepted: Orders

## 2023-09-09 NOTE — Progress Notes (Signed)
 Carelink Summary Report / Loop Recorder

## 2023-09-13 ENCOUNTER — Ambulatory Visit: Payer: Self-pay | Admitting: Cardiology

## 2023-09-16 ENCOUNTER — Other Ambulatory Visit: Payer: Self-pay | Admitting: Family Medicine

## 2023-09-16 DIAGNOSIS — Z1231 Encounter for screening mammogram for malignant neoplasm of breast: Secondary | ICD-10-CM

## 2023-09-29 ENCOUNTER — Inpatient Hospital Stay
Admission: RE | Admit: 2023-09-29 | Discharge: 2023-09-29 | Disposition: A | Discharge status: Home / Self Care | Payer: Self-pay | Source: Ambulatory Visit | Attending: Family Medicine | Admitting: Family Medicine

## 2023-09-29 ENCOUNTER — Other Ambulatory Visit: Payer: Self-pay | Admitting: *Deleted

## 2023-09-29 DIAGNOSIS — Z1231 Encounter for screening mammogram for malignant neoplasm of breast: Secondary | ICD-10-CM

## 2023-10-02 ENCOUNTER — Ambulatory Visit

## 2023-10-02 DIAGNOSIS — R001 Bradycardia, unspecified: Secondary | ICD-10-CM | POA: Diagnosis not present

## 2023-10-02 LAB — CUP PACEART REMOTE DEVICE CHECK
Date Time Interrogation Session: 20250710233054
Implantable Pulse Generator Implant Date: 20241111

## 2023-10-11 ENCOUNTER — Ambulatory Visit: Payer: Self-pay | Admitting: Cardiology

## 2023-10-19 ENCOUNTER — Ambulatory Visit
Admission: RE | Admit: 2023-10-19 | Discharge: 2023-10-19 | Disposition: A | Discharge status: Home / Self Care | Source: Ambulatory Visit | Attending: Family Medicine | Admitting: Family Medicine

## 2023-10-19 DIAGNOSIS — Z1231 Encounter for screening mammogram for malignant neoplasm of breast: Secondary | ICD-10-CM | POA: Insufficient documentation

## 2023-10-23 ENCOUNTER — Other Ambulatory Visit: Payer: Self-pay | Admitting: Family Medicine

## 2023-10-23 DIAGNOSIS — R928 Other abnormal and inconclusive findings on diagnostic imaging of breast: Secondary | ICD-10-CM

## 2023-10-27 ENCOUNTER — Other Ambulatory Visit

## 2023-10-27 ENCOUNTER — Encounter

## 2023-10-27 ENCOUNTER — Ambulatory Visit

## 2023-11-02 ENCOUNTER — Encounter

## 2023-11-02 LAB — CUP PACEART REMOTE DEVICE CHECK
Date Time Interrogation Session: 20250810234547
Implantable Pulse Generator Implant Date: 20241111

## 2023-11-03 ENCOUNTER — Ambulatory Visit
Admission: RE | Admit: 2023-11-03 | Discharge: 2023-11-03 | Disposition: A | Discharge status: Home / Self Care | Source: Ambulatory Visit | Attending: Family Medicine | Admitting: Family Medicine

## 2023-11-03 DIAGNOSIS — R928 Other abnormal and inconclusive findings on diagnostic imaging of breast: Secondary | ICD-10-CM | POA: Insufficient documentation

## 2023-11-03 NOTE — Progress Notes (Signed)
 Carelink Summary Report / Loop Recorder

## 2023-11-04 ENCOUNTER — Ambulatory Visit: Payer: Self-pay | Admitting: Cardiology

## 2023-11-26 ENCOUNTER — Encounter: Payer: Self-pay | Admitting: Internal Medicine

## 2023-11-26 ENCOUNTER — Ambulatory Visit: Attending: Internal Medicine | Admitting: Internal Medicine

## 2023-11-26 VITALS — BP 120/60 | HR 60 | Ht 64.0 in | Wt 183.4 lb

## 2023-11-26 DIAGNOSIS — E785 Hyperlipidemia, unspecified: Secondary | ICD-10-CM

## 2023-11-26 DIAGNOSIS — I951 Orthostatic hypotension: Secondary | ICD-10-CM

## 2023-11-26 DIAGNOSIS — I517 Cardiomegaly: Secondary | ICD-10-CM

## 2023-11-26 DIAGNOSIS — R55 Syncope and collapse: Secondary | ICD-10-CM

## 2023-11-26 DIAGNOSIS — I2510 Atherosclerotic heart disease of native coronary artery without angina pectoris: Secondary | ICD-10-CM | POA: Diagnosis not present

## 2023-11-26 DIAGNOSIS — I10 Essential (primary) hypertension: Secondary | ICD-10-CM

## 2023-11-26 DIAGNOSIS — I739 Peripheral vascular disease, unspecified: Secondary | ICD-10-CM

## 2023-11-26 DIAGNOSIS — R001 Bradycardia, unspecified: Secondary | ICD-10-CM | POA: Diagnosis not present

## 2023-11-26 MED ORDER — SPIRONOLACTONE 25 MG PO TABS
25.0000 mg | ORAL_TABLET | Freq: Every day | ORAL | 3 refills | Status: AC
Start: 2023-11-26 — End: ?

## 2023-11-26 NOTE — Progress Notes (Unsigned)
  Cardiology Office Note:  .   Date:  11/26/2023  ID:  Natalie Knox, DOB 1950-05-04, MRN [id number] PCP: Diedra Lame, MD  [facility] Providers Cardiologist:  Lonni Hanson, MD { Click to update primary MD,subspecialty MD or APP then REFRESH:1}    History of Present Illness: .   Natalie Knox is a 73 y.o. female with history of hypertension, left ventricular hypertrophy, hyperlipidemia, iron  deficiency anemia, and neurosyphilis, who presents for follow-up of hypertension and LVH.  I last saw her in May, at which time she was feeling well other than occasional orthostatic lightheadedness and leg edema.  She also noted some slight progression in her bilateral lower extremity claudication.  We did not make any medication changes at that time.  ABIs showed slight interval worsening in the left lower extremity.  Today, [name] reports that she had an episode of syncope while her daughter was washing her hair in the bathtub.  She has had several other episodes of orthostatic lightheadedness but has not passed out again.  She denies prodromal symptoms.  She has been trying to stay well-hydrated, drinking 32 to 48 ounces of water  per day.  She notes that her blood pressures have typically been normal at home.  She denies chest pain, palpitations, and edema.  ROS: See HPI  Studies Reviewed: SABRA   EKG Interpretation Date/Time:  Thursday November 26 2023 09:09:02 EDT Ventricular Rate:  61 PR Interval:  164 QRS Duration:  68 QT Interval:  424 QTC Calculation: 426 R Axis:   30  Text Interpretation: Normal sinus rhythm Low voltage QRS ST & T wave abnormality, consider lateral ischemia Abnormal ECG When compared with ECG of 05-Aug-2023 16:03, No significant change was found Confirmed by Kyleeann Cremeans ([id number]) on 11/26/2023 5:09:32 PM    ABIs (08/31/2023): 0.58 on the right and 0.44 on the left.  TBI is 0.53 on the right and 0.23 on the left.  Duplex study shows greater than 50% stenoses in  both iliac arteries as well as moderate bilateral SFA disease with two-vessel runoff.  Risk Assessment/Calculations:         Physical Exam:   VS:  BP 120/60 (BP Location: Left Arm, Patient Position: Sitting, Cuff Size: Normal)   Pulse 60   Ht 5' 4 (1.626 m)   Wt 183 lb 6 oz (83.2 kg)   SpO2 96%   BMI 31.48 kg/m    Wt Readings from Last 3 Encounters:  11/26/23 183 lb 6 oz (83.2 kg)  08/05/23 179 lb (81.2 kg)  02/02/23 171 lb 9.6 oz (77.8 kg)    Position Blood pressure (mmHg) Heart rate (bpm)  Lying 146/69 57  Sitting 134/69 58  Standing 128/63 66  Standing (3 minutes) 123/66 72   General:  NAD. Neck: No JVD or HJR. Lungs: Clear to auscultation bilaterally without wheezes or crackles. Heart: Regular rate and rhythm with 2/6 systolic murmur. Abdomen: Soft, nontender, nondistended. Extremities: No lower extremity edema.  ASSESSMENT AND PLAN: .    Orthostatic hypotension and syncope:     {Are you ordering a CV Procedure (e.g. stress test, cath, DCCV, TEE, etc)?   Press F2        :[id number]}  Dispo: ***  Signed, Lonni Hanson, MD

## 2023-11-26 NOTE — Patient Instructions (Addendum)
 Medication Instructions:  Your physician recommends the following medication changes.    DECREASE: Spironolactone  to 25 Mg daily ( cut the remaining meds in half )    *If you need a refill on your cardiac medications before your next appointment, please call your pharmacy*  Lab Work: No labs ordered today  If you have labs (blood work) drawn today and your tests are completely normal, you will receive your results only by: MyChart Message (if you have MyChart) OR A paper copy in the mail If you have any lab test that is abnormal or we need to change your treatment, we will call you to review the results.  Testing/Procedures:  Your physician has requested that you have a carotid duplex. This test is an ultrasound of the carotid arteries in your neck. It looks at blood flow through these arteries that supply the brain with blood.   Allow one hour for this exam.  There are no restrictions or special instructions.  This will take place at [address] [facility] [address]) [address], [place] [address]  Please note: We ask at that you not bring children with you during ultrasound (echo/ vascular) testing. Due to room size and safety concerns, children are not allowed in the ultrasound rooms during exams. Our front office staff cannot provide observation of children in our lobby area while testing is being conducted. An adult accompanying a patient to their appointment will only be allowed in the ultrasound room at the discretion of the ultrasound technician under special circumstances. We apologize for any inconvenience.   Follow-Up: At [facility], you and your health needs are our priority.  As part of our continuing mission to provide you with exceptional heart care, our providers are all part of one team.  This team includes your primary Cardiologist (physician) and Advanced Practice Providers or APPs (Physician Assistants and Nurse Practitioners) who all work together to  provide you with the care you need, when you need it.  Your next appointment:   4-6  week(s)  Provider:   You will see one of the following Advanced Practice Providers on your designated Care Team:   Lonni Meager, NP Lesley Maffucci, PA-C Bernardino Bring, PA-C Cadence Franchester, PA-C Tylene Lunch, NP Barnie Hila, NP    Dr. Mady recommends to increase your fluid intake by 64 oz daily

## 2023-11-27 ENCOUNTER — Encounter: Payer: Self-pay | Admitting: Internal Medicine

## 2023-12-03 ENCOUNTER — Ambulatory Visit

## 2023-12-03 DIAGNOSIS — R55 Syncope and collapse: Secondary | ICD-10-CM

## 2023-12-03 LAB — CUP PACEART REMOTE DEVICE CHECK
Date Time Interrogation Session: 20250910233742
Implantable Pulse Generator Implant Date: 20241111

## 2023-12-10 NOTE — Progress Notes (Signed)
 Remote Loop Recorder Transmission

## 2023-12-13 ENCOUNTER — Ambulatory Visit: Payer: Self-pay | Admitting: Cardiology

## 2023-12-31 NOTE — Progress Notes (Signed)
 Remote Loop Recorder Transmission

## 2024-01-04 ENCOUNTER — Encounter

## 2024-01-04 ENCOUNTER — Ambulatory Visit (INDEPENDENT_AMBULATORY_CARE_PROVIDER_SITE_OTHER)

## 2024-01-04 DIAGNOSIS — R55 Syncope and collapse: Secondary | ICD-10-CM | POA: Diagnosis not present

## 2024-01-04 LAB — CUP PACEART REMOTE DEVICE CHECK
Date Time Interrogation Session: 20251012234506
Implantable Pulse Generator Implant Date: 20241111

## 2024-01-06 ENCOUNTER — Ambulatory Visit: Payer: Self-pay | Admitting: Cardiology

## 2024-01-06 NOTE — Progress Notes (Signed)
 Remote Loop Recorder Transmission

## 2024-01-12 ENCOUNTER — Ambulatory Visit: Attending: Internal Medicine

## 2024-01-12 ENCOUNTER — Encounter: Payer: Self-pay | Admitting: Oncology

## 2024-01-12 DIAGNOSIS — I6523 Occlusion and stenosis of bilateral carotid arteries: Secondary | ICD-10-CM

## 2024-01-12 DIAGNOSIS — I2510 Atherosclerotic heart disease of native coronary artery without angina pectoris: Secondary | ICD-10-CM | POA: Diagnosis not present

## 2024-01-13 NOTE — Progress Notes (Signed)
 Natalie Knox is a 73 y.o. female in clinic today for an acute visit.  HPI: History of Present Illness Natalie Knox is a 73 year old female who presents with a persistent cough for two weeks.  The cough began about a month ago and was temporarily alleviated with an over-the-counter topical treatment but returned and has persisted for the past two weeks.  The cough is productive of thick white mucus. No fever, nasal congestion, shortness of breath, or wheezing reported by the patient. The cough is keeping her up at night.  She has not taken any over-the-counter medications other than a topical rub for her symptoms. No history of asthma, COPD, or diabetes, and she has never taken prednisone before. The cough has remained consistent without improvement over the past two weeks.   ROS: Review of Systems  Constitutional:  Negative for chills and fever.  HENT:  Negative for congestion, sinus pain and sore throat.   Respiratory:  Positive for cough and sputum production. Negative for shortness of breath and wheezing.      No Known Allergies  Past Medical History:  Diagnosis Date  . Hyperlipidemia   . Hypertension   . Left ventricular hypertrophy      BP 126/68 (BP Location: Left upper arm, Patient Position: Sitting, BP Cuff Size: Adult)   Pulse 65   Temp 36.8 C (98.2 F) (Temporal)   Ht 160 cm (5' 2.99)   Wt 82.7 kg (182 lb 6.4 oz)   SpO2 98%   BMI 32.32 kg/m    Physical Exam Constitutional:      Appearance: Normal appearance.  Cardiovascular:     Rate and Rhythm: Normal rate and regular rhythm.     Heart sounds: Normal heart sounds.  Pulmonary:     Effort: Pulmonary effort is normal.     Breath sounds: Examination of the left-middle field reveals wheezing. Examination of the left-lower field reveals wheezing. Wheezing present.  Neurological:     Mental Status: She is alert.    Assessment & Plan Bronchitis with wheezing Productive cough with wheezing for 2 weeks.  Patient has not had any improvement in her cough and it has been keeping her up at night. No fever or systemic symptoms. Wheezing noted on exam. - Prescribed azithromycin: 2 pills on day 1, then 1 pill daily for 4 days. - Prescribed prednisone: 2 pills daily for 5 days, then 1 pill daily for 5 days. - Prescribed benzonatate for cough. - Follow up if symptoms worsen or do not improve.       Powell Snell, PA-C

## 2024-01-14 ENCOUNTER — Ambulatory Visit: Admitting: Cardiology

## 2024-01-14 ENCOUNTER — Ambulatory Visit: Payer: Self-pay | Admitting: Internal Medicine
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
## 2024-02-04 ENCOUNTER — Encounter

## 2024-02-04 ENCOUNTER — Ambulatory Visit

## 2024-02-04 DIAGNOSIS — R55 Syncope and collapse: Secondary | ICD-10-CM | POA: Diagnosis not present

## 2024-02-04 LAB — CUP PACEART REMOTE DEVICE CHECK
Date Time Interrogation Session: 20251112234209
Implantable Pulse Generator Implant Date: 20241111

## 2024-02-05 ENCOUNTER — Ambulatory Visit: Payer: Self-pay | Admitting: Cardiology

## 2024-02-08 ENCOUNTER — Ambulatory Visit: Attending: Physician Assistant | Admitting: Physician Assistant

## 2024-02-08 ENCOUNTER — Encounter: Payer: Self-pay | Admitting: Physician Assistant

## 2024-02-08 VITALS — BP 128/50 | HR 52 | Ht 64.0 in | Wt 182.0 lb

## 2024-02-08 DIAGNOSIS — I2510 Atherosclerotic heart disease of native coronary artery without angina pectoris: Secondary | ICD-10-CM | POA: Diagnosis not present

## 2024-02-08 DIAGNOSIS — R55 Syncope and collapse: Secondary | ICD-10-CM

## 2024-02-08 DIAGNOSIS — I951 Orthostatic hypotension: Secondary | ICD-10-CM

## 2024-02-08 DIAGNOSIS — I517 Cardiomegaly: Secondary | ICD-10-CM

## 2024-02-08 DIAGNOSIS — I739 Peripheral vascular disease, unspecified: Secondary | ICD-10-CM | POA: Diagnosis not present

## 2024-02-08 DIAGNOSIS — E785 Hyperlipidemia, unspecified: Secondary | ICD-10-CM

## 2024-02-08 DIAGNOSIS — I10 Essential (primary) hypertension: Secondary | ICD-10-CM
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
## 2024-02-08 NOTE — Patient Instructions (Signed)
 Medication Instructions:  Your physician recommends that you continue on your current medications as directed. Please refer to the Current Medication list given to you today.  *If you need a refill on your cardiac medications before your next appointment, please call your pharmacy*  Lab Work: No labs ordered today  If you have labs (blood work) drawn today and your tests are completely normal, you will receive your results only by: MyChart Message (if you have MyChart) OR A paper copy in the mail If you have any lab test that is abnormal or we need to change your treatment, we will call you to review the results.  Testing/Procedures: No test ordered today   Follow-Up: At [facility], you and your health needs are our priority.  As part of our continuing mission to provide you with exceptional heart care, our providers are all part of one team.  This team includes your primary Cardiologist (physician) and Advanced Practice Providers or APPs (Physician Assistants and Nurse Practitioners) who all work together to provide you with the care you need, when you need it.  Your next appointment:   3 -4 month(s)  Provider:   You may see Lonni Hanson, MD or one of the following Advanced Practice Providers on your designated Care Team:   Lonni Meager, NP Lesley Maffucci, PA-C Bernardino Bring, PA-C Cadence Eschbach, PA-C Tylene Lunch, NP Barnie Hila, NP    We recommend signing up for the patient portal called MyChart.  Sign up information is provided on this After Visit Summary.  MyChart is used to connect with patients for Virtual Visits (Telemedicine).  Patients are able to view lab/test results, encounter notes, upcoming appointments, etc.  Non-urgent messages can be sent to your provider as well.   To learn more about what you can do with MyChart, go to [email or web address].

## 2024-02-09 NOTE — Progress Notes (Signed)
 Remote Loop Recorder Transmission

## 2024-02-10 ENCOUNTER — Ambulatory Visit: Admitting: Cardiology

## 2024-03-06 ENCOUNTER — Ambulatory Visit

## 2024-03-06 DIAGNOSIS — R55 Syncope and collapse: Secondary | ICD-10-CM | POA: Diagnosis not present

## 2024-03-07 ENCOUNTER — Encounter

## 2024-03-08 LAB — CUP PACEART REMOTE DEVICE CHECK
Date Time Interrogation Session: 20251213233408
Implantable Pulse Generator Implant Date: 20241111

## 2024-03-10 ENCOUNTER — Ambulatory Visit: Payer: Self-pay | Admitting: Cardiology

## 2024-03-11 NOTE — Progress Notes (Signed)
 Remote Loop Recorder Transmission

## 2024-04-06 ENCOUNTER — Encounter

## 2024-04-06 DIAGNOSIS — R55 Syncope and collapse: Secondary | ICD-10-CM | POA: Diagnosis not present

## 2024-04-06 LAB — CUP PACEART REMOTE DEVICE CHECK
Date Time Interrogation Session: 20260113234503
Implantable Pulse Generator Implant Date: 20241111

## 2024-04-07 ENCOUNTER — Encounter

## 2024-04-10 ENCOUNTER — Ambulatory Visit: Payer: Self-pay | Admitting: Cardiology

## 2024-04-13 NOTE — Progress Notes (Signed)
 Remote Loop Recorder Transmission

## 2024-05-07 ENCOUNTER — Encounter

## 2024-06-01 ENCOUNTER — Ambulatory Visit: Admitting: Internal Medicine

## 2024-06-07 ENCOUNTER — Encounter

## 2024-07-08 ENCOUNTER — Encounter

## 2024-08-08 ENCOUNTER — Encounter

## 2024-09-08 ENCOUNTER — Encounter
